# Patient Record
Sex: Female | Born: 1965
Health system: Southern US, Community
[De-identification: ages and names within clinical notes are randomized; demographics above are authoritative.]

## PROBLEM LIST (undated history)

## (undated) DIAGNOSIS — I6529 Occlusion and stenosis of unspecified carotid artery: Secondary | ICD-10-CM

## (undated) DIAGNOSIS — I639 Cerebral infarction, unspecified: Secondary | ICD-10-CM

## (undated) DIAGNOSIS — D649 Anemia, unspecified: Secondary | ICD-10-CM

## (undated) DIAGNOSIS — G459 Transient cerebral ischemic attack, unspecified: Secondary | ICD-10-CM

## (undated) DIAGNOSIS — I10 Essential (primary) hypertension: Secondary | ICD-10-CM

## (undated) HISTORY — DX: Cerebral infarction, unspecified: I63.9

## (undated) HISTORY — DX: Essential (primary) hypertension: I10

## (undated) HISTORY — DX: Occlusion and stenosis of unspecified carotid artery: I65.29

---

## 2001-07-24 ENCOUNTER — Emergency Department (HOSPITAL_COMMUNITY): Admission: EM | Admit: 2001-07-24 | Discharge: 2001-07-24 | Payer: Self-pay | Admitting: Emergency Medicine

## 2003-11-06 ENCOUNTER — Encounter: Admission: RE | Admit: 2003-11-06 | Discharge: 2003-11-06 | Payer: Self-pay | Admitting: Obstetrics and Gynecology

## 2006-05-25 ENCOUNTER — Ambulatory Visit: Payer: Self-pay | Admitting: Nurse Practitioner

## 2007-10-20 ENCOUNTER — Emergency Department (HOSPITAL_COMMUNITY): Admission: EM | Admit: 2007-10-20 | Discharge: 2007-10-20 | Payer: Self-pay | Admitting: Emergency Medicine

## 2010-06-11 ENCOUNTER — Other Ambulatory Visit: Payer: Self-pay | Admitting: Obstetrics and Gynecology

## 2010-06-11 DIAGNOSIS — Z1231 Encounter for screening mammogram for malignant neoplasm of breast: Secondary | ICD-10-CM

## 2010-06-29 ENCOUNTER — Ambulatory Visit: Payer: Self-pay

## 2010-06-29 ENCOUNTER — Other Ambulatory Visit: Payer: Self-pay | Admitting: Internal Medicine

## 2010-07-16 ENCOUNTER — Ambulatory Visit
Admission: RE | Admit: 2010-07-16 | Discharge: 2010-07-16 | Disposition: A | Discharge status: Home / Self Care | Payer: Medicaid Other | Source: Ambulatory Visit | Attending: Obstetrics and Gynecology | Admitting: Obstetrics and Gynecology

## 2010-07-16 DIAGNOSIS — Z1231 Encounter for screening mammogram for malignant neoplasm of breast: Secondary | ICD-10-CM

## 2010-08-28 ENCOUNTER — Emergency Department (HOSPITAL_COMMUNITY): Payer: Medicaid Other

## 2010-08-28 ENCOUNTER — Emergency Department (HOSPITAL_COMMUNITY)
Admission: EM | Admit: 2010-08-28 | Discharge: 2010-08-28 | Disposition: A | Discharge status: Home / Self Care | Payer: Medicaid Other | Attending: Emergency Medicine | Admitting: Emergency Medicine

## 2010-08-28 DIAGNOSIS — R05 Cough: Secondary | ICD-10-CM | POA: Insufficient documentation

## 2010-08-28 DIAGNOSIS — J3489 Other specified disorders of nose and nasal sinuses: Secondary | ICD-10-CM | POA: Insufficient documentation

## 2010-08-28 DIAGNOSIS — R5383 Other fatigue: Secondary | ICD-10-CM | POA: Insufficient documentation

## 2010-08-28 DIAGNOSIS — R5381 Other malaise: Secondary | ICD-10-CM | POA: Insufficient documentation

## 2010-08-28 DIAGNOSIS — R059 Cough, unspecified: Secondary | ICD-10-CM | POA: Insufficient documentation

## 2010-08-28 DIAGNOSIS — R509 Fever, unspecified: Secondary | ICD-10-CM | POA: Insufficient documentation

## 2010-08-28 DIAGNOSIS — J189 Pneumonia, unspecified organism: Secondary | ICD-10-CM | POA: Insufficient documentation

## 2011-01-27 LAB — URINE MICROSCOPIC-ADD ON

## 2011-01-27 LAB — URINALYSIS, ROUTINE W REFLEX MICROSCOPIC
Ketones, ur: NEGATIVE
Protein, ur: NEGATIVE
Specific Gravity, Urine: 1.02
Urobilinogen, UA: 1
pH: 7.5

## 2011-01-27 LAB — HEPATIC FUNCTION PANEL
AST: 16
Albumin: 3.8
Alkaline Phosphatase: 60
Indirect Bilirubin: 0.6
Total Protein: 7

## 2011-01-27 LAB — WET PREP, GENITAL: Trich, Wet Prep: NONE SEEN

## 2011-01-27 LAB — POCT I-STAT, CHEM 8
Calcium, Ion: 1.15
Chloride: 103
Creatinine, Ser: 0.8
Potassium: 3.4 — ABNORMAL LOW
Sodium: 140

## 2011-01-27 LAB — DIFFERENTIAL
Basophils Absolute: 0
Basophils Relative: 0
Lymphocytes Relative: 34
Monocytes Absolute: 0.4

## 2011-01-27 LAB — CBC
MCV: 85
WBC: 6.1

## 2011-01-27 LAB — URINE CULTURE: Culture: NO GROWTH

## 2011-01-27 LAB — POCT PREGNANCY, URINE
Operator id: 257131
Preg Test, Ur: NEGATIVE

## 2011-07-23 ENCOUNTER — Ambulatory Visit (INDEPENDENT_AMBULATORY_CARE_PROVIDER_SITE_OTHER): Payer: BC Managed Care – PPO | Admitting: Family Medicine

## 2011-07-23 VITALS — BP 120/85 | HR 90 | Temp 98.7°F | Resp 16 | Ht 66.0 in | Wt 228.0 lb

## 2011-07-23 DIAGNOSIS — R6889 Other general symptoms and signs: Secondary | ICD-10-CM

## 2011-07-23 DIAGNOSIS — R05 Cough: Secondary | ICD-10-CM

## 2011-07-23 DIAGNOSIS — J329 Chronic sinusitis, unspecified: Secondary | ICD-10-CM

## 2011-07-23 DIAGNOSIS — J111 Influenza due to unidentified influenza virus with other respiratory manifestations: Secondary | ICD-10-CM

## 2011-07-23 DIAGNOSIS — R059 Cough, unspecified: Secondary | ICD-10-CM

## 2011-07-23 DIAGNOSIS — J019 Acute sinusitis, unspecified: Secondary | ICD-10-CM

## 2011-07-23 LAB — POCT INFLUENZA A/B
Influenza A, POC: NEGATIVE
Influenza B, POC: NEGATIVE

## 2011-07-23 MED ORDER — HYDROCODONE-HOMATROPINE 5-1.5 MG/5ML PO SYRP: 5.0000 mL | ORAL_SOLUTION | Freq: Three times a day (TID) | ORAL | Status: AC | PRN

## 2011-07-23 MED ORDER — FLUTICASONE PROPIONATE 50 MCG/ACT NA SUSP: 2.0000 | Freq: Every day | NASAL | Status: DC

## 2011-07-23 MED ORDER — AZITHROMYCIN 250 MG PO TABS: ORAL_TABLET | ORAL | Status: AC

## 2011-07-23 MED ORDER — BENZONATATE 100 MG PO CAPS: 200.0000 mg | ORAL_CAPSULE | Freq: Two times a day (BID) | ORAL | Status: AC | PRN

## 2011-07-23 NOTE — Progress Notes (Signed)
  [facility]:  Office Visit  Chief Complaint:  Chief Complaint  Patient presents with  . Cough  . Chills  . Nasal Congestion    HPI: Ruth Conley is a 46 y.o. female who complains of  2 day history of msk aches and pain, chills, and wet and dry cough, nasal congestion . Yellow phlegm. + sinus pressure   Past Medical History  Diagnosis Date  . Hypertension    Past Surgical History  Procedure Date  . Cesarean section    History   Social History  . Marital Status: Legally Separated    Spouse Name: N/A    Number of Children: N/A  . Years of Education: N/A   Social History Main Topics  . Smoking status: Never Smoker   . Smokeless tobacco: None  . Alcohol Use: None  . Drug Use: None  . Sexually Active: None   Other Topics Concern  . None   Social History Narrative  . None   No family history on file. No Known Allergies Prior to Admission medications   Not on File     ROS: The patient denies fevers,, night sweats, unintentional weight loss, chest pain, palpitations, wheezing, dyspnea on exertion, nausea, vomiting, abdominal pain, dysuria, hematuria, melena, numbness, weakness, or tingling.  All other systems have been reviewed and were otherwise negative with the exception of those mentioned in the HPI and as above.    PHYSICAL EXAM: Filed Vitals:   07/23/11 1626  BP: 142/93  Pulse: 103  Temp: 98.7 F (37.1 C)  Resp: 16   Filed Vitals:   07/23/11 1626  Height: 5\' 6"  (1.676 m)  Weight: 228 lb (103.42 kg)   Body mass index is 36.80 kg/(m^2).  General: Alert, no acute distress HEENT:  Normocephalic, atraumatic, oropharynx patent. Tmnl, + sinus pressure, erythematous nares Cardiovascular:  Regular rate and rhythm, no rubs murmurs or gallops.  No Carotid bruits, radial pulse intact. No pedal edema.  Respiratory: Clear to auscultation bilaterally.  No wheezes, rales, or rhonchi.  No cyanosis, no use of accessory musculature GI: No  organomegaly, abdomen is soft and non-tender, positive bowel sounds.  No masses. Skin: No rashes. Neurologic: Facial musculature symmetric. Psychiatric: Patient is appropriate throughout our interaction. Lymphatic: No cervical lymphadenopathy Musculoskeletal: Gait intact.   LABS: Results for orders placed in visit on 07/23/11  POCT INFLUENZA A/B      Component Value Range   Influenza A, POC Negative     Influenza B, POC Negative       EKG/XRAY:   Primary read interpreted by Dr. Conley Rolls at [facility].   ASSESSMENT/PLAN: Encounter Diagnoses  Name Primary?  . Flu-like symptoms Yes  . Sinusitis     Sxs treatment, may take Z pack in 1 week if no improvement   Ruth Zhao PHUONG, DO 07/23/2011 5:30 PM

## 2011-12-05 ENCOUNTER — Other Ambulatory Visit: Payer: Self-pay | Admitting: Obstetrics and Gynecology

## 2011-12-05 DIAGNOSIS — Z1231 Encounter for screening mammogram for malignant neoplasm of breast: Secondary | ICD-10-CM

## 2011-12-12 ENCOUNTER — Ambulatory Visit
Admission: RE | Admit: 2011-12-12 | Discharge: 2011-12-12 | Disposition: A | Discharge status: Home / Self Care | Payer: BC Managed Care – PPO | Source: Ambulatory Visit | Attending: Obstetrics and Gynecology | Admitting: Obstetrics and Gynecology

## 2011-12-12 DIAGNOSIS — Z1231 Encounter for screening mammogram for malignant neoplasm of breast: Secondary | ICD-10-CM

## 2012-04-07 ENCOUNTER — Encounter: Payer: Self-pay | Admitting: Family Medicine

## 2012-04-07 ENCOUNTER — Ambulatory Visit (INDEPENDENT_AMBULATORY_CARE_PROVIDER_SITE_OTHER): Payer: BC Managed Care – PPO | Admitting: Family Medicine

## 2012-04-07 VITALS — BP 151/94 | HR 88 | Temp 99.0°F | Resp 16 | Ht 67.0 in | Wt 254.0 lb

## 2012-04-07 DIAGNOSIS — J3489 Other specified disorders of nose and nasal sinuses: Secondary | ICD-10-CM

## 2012-04-07 DIAGNOSIS — R05 Cough: Secondary | ICD-10-CM

## 2012-04-07 DIAGNOSIS — R0981 Nasal congestion: Secondary | ICD-10-CM

## 2012-04-07 DIAGNOSIS — R059 Cough, unspecified: Secondary | ICD-10-CM

## 2012-04-07 MED ORDER — HYDROCODONE-HOMATROPINE 5-1.5 MG/5ML PO SYRP: 5.0000 mL | ORAL_SOLUTION | Freq: Three times a day (TID) | ORAL | Status: DC | PRN

## 2012-04-07 MED ORDER — AMOXICILLIN 875 MG PO TABS: 875.0000 mg | ORAL_TABLET | Freq: Two times a day (BID) | ORAL | Status: DC

## 2012-04-07 NOTE — Patient Instructions (Addendum)

## 2012-04-07 NOTE — Progress Notes (Signed)
@[email or web address] @   Patient ID: Ruth Conley MRN: [id number], DOB: Jan 05, 1966, 46 y.o. Date of Encounter: 04/07/2012, 1:03 PM  Primary Physician: No primary provider on file.  Chief Complaint:  Chief Complaint  Patient presents with  . Cough    productive  . Sore Throat    sinus pain and pressure symptoms x 3 days    HPI: 46 y.o. year old female presents with 3 day history of nasal congestion, post nasal drip, sore throat, sinus pressure, and cough. Afebrile. No chills. Nasal congestion thick and green/yellow. Sinus pressure is the worst symptom. Cough is productive secondary to post nasal drip and not associated with time of day. Ears feel full, leading to sensation of muffled hearing. Has tried OTC cold preps without success. No GI complaints.   No recent antibiotics, recent travels, or sick contacts   No leg trauma, sedentary periods, h/o cancer, or tobacco use.  Past Medical History  Diagnosis Date  . Hypertension      Home Meds: Prior to Admission medications   Medication Sig Start Date End Date Taking? Authorizing Provider  guaiFENesin-dextromethorphan (ROBITUSSIN DM) 100-10 MG/5ML syrup Take 5 mLs by mouth 3 (three) times daily as needed.   Yes Historical Provider, MD  naproxen sodium (ANAPROX) 220 MG tablet Take 220 mg by mouth 2 (two) times daily with a meal.   Yes Historical Provider, MD  amoxicillin (AMOXIL) 875 MG tablet Take 1 tablet (875 mg total) by mouth 2 (two) times daily. 04/07/12   Elvina Sidle, MD  fluticasone (FLONASE) 50 MCG/ACT nasal spray Place 2 sprays into the nose daily. 07/23/11 07/22/12  Thao P Le, DO  HYDROcodone-homatropine (HYCODAN) 5-1.5 MG/5ML syrup Take 5 mLs by mouth every 8 (eight) hours as needed for cough. 04/07/12   Elvina Sidle, MD    Allergies: No Known Allergies  History   Social History  . Marital Status: Legally Separated    Spouse Name: N/A    Number of Children: N/A  . Years of Education: N/A   Occupational History  . Not  on file.   Social History Main Topics  . Smoking status: Never Smoker   . Smokeless tobacco: Not on file  . Alcohol Use: Not on file  . Drug Use: Not on file  . Sexually Active: Not on file   Other Topics Concern  . Not on file   Social History Narrative  . No narrative on file     Review of Systems: Constitutional: negative for chills, fever, night sweats or weight changes Cardiovascular: negative for chest pain or palpitations Respiratory: negative for hemoptysis, wheezing, or shortness of breath Abdominal: negative for abdominal pain, nausea, vomiting or diarrhea Dermatological: negative for rash Neurologic: negative for headache   Physical Exam: Blood pressure 151/94, pulse 88, temperature 99 F (37.2 C), temperature source Oral, resp. rate 16, height 5\' 7"  (1.702 m), weight 254 lb (115.214 kg), SpO2 100.00%., Body mass index is 39.78 kg/(m^2). General: Well developed, well nourished, in no acute distress. Head: Normocephalic, atraumatic, eyes without discharge, sclera non-icteric, nares are congested. Bilateral auditory canals clear, TM's are without perforation, pearly grey with reflective cone of light bilaterally. Serous effusion bilaterally behind TM's. Maxillary sinus TTP. Oral cavity moist, dentition multiple dental caries. Posterior pharynx with post nasal drip and mild erythema. No peritonsillar abscess or tonsillar exudate. Neck: Supple. No thyromegaly. Full ROM. No lymphadenopathy. Lungs: Clear bilaterally to auscultation without wheezes, rales, or rhonchi. Breathing is unlabored.  Heart: RRR with S1 S2. No murmurs, rubs,  or gallops appreciated. Msk:  Strength and tone normal for age. Extremities: No clubbing or cyanosis. No edema. Neuro: Alert and oriented X 3. Moves all extremities spontaneously. CNII-XII grossly in tact. Psych:  Responds to questions appropriately with a normal affect.   Labs:   ASSESSMENT AND PLAN:  46 y.o. year old female with  sinusitis 1. Sinus congestion  amoxicillin (AMOXIL) 875 MG tablet  2. Cough  HYDROcodone-homatropine (HYCODAN) 5-1.5 MG/5ML syrup    -  -Tylenol/Motrin prn -Rest/fluids -RTC precautions -RTC 3-5 days if no improvement  Signed, Elvina Sidle, MD 04/07/2012 1:03 PM

## 2012-04-09 ENCOUNTER — Telehealth: Payer: Self-pay

## 2012-04-09 NOTE — Telephone Encounter (Signed)
PT STATES SHE WAS OFFERED A NOTE FOR WORK, BUT DIDN'T THINK SHE NEEDED IT, BUT NOW HER DOES. WASN'T ABLE TO GO TO WORK TODAY SO JUST NEED IT FOR TODAY. PLEASE CALL [phone] WHEN READY

## 2012-04-10 ENCOUNTER — Telehealth: Payer: Self-pay

## 2012-04-10 NOTE — Telephone Encounter (Signed)
PATIENT STATES SHE WAS IN TO SEE DR. Milus Glazier DEC. 7TH, FOR A COUGH AND SORE THROAT. SHE NEEDS TO GET AN OUT OF WORK NOTE FOR Monday AND Tuesday AND RETURNING BACK TO WORK ON Wednesday. PLEASE CALL HER WHEN IT IS READY TO BE PICKED UP. BEST PHONE [phone] (CELL)   MBC

## 2012-04-10 NOTE — Telephone Encounter (Signed)
Called her, I can give her the note, but if she is not better by Wednesday and can not work, she must return to clinic.

## 2012-08-20 ENCOUNTER — Other Ambulatory Visit: Payer: Self-pay

## 2012-08-20 DIAGNOSIS — Z1231 Encounter for screening mammogram for malignant neoplasm of breast: Secondary | ICD-10-CM

## 2012-10-10 ENCOUNTER — Emergency Department (HOSPITAL_BASED_OUTPATIENT_CLINIC_OR_DEPARTMENT_OTHER)
Admission: EM | Admit: 2012-10-10 | Discharge: 2012-10-10 | Disposition: A | Discharge status: Home / Self Care | Payer: Medicaid Other | Attending: Emergency Medicine | Admitting: Emergency Medicine

## 2012-10-10 ENCOUNTER — Encounter (HOSPITAL_BASED_OUTPATIENT_CLINIC_OR_DEPARTMENT_OTHER): Payer: Self-pay | Admitting: *Deleted

## 2012-10-10 DIAGNOSIS — R0981 Nasal congestion: Secondary | ICD-10-CM

## 2012-10-10 DIAGNOSIS — Z791 Long term (current) use of non-steroidal anti-inflammatories (NSAID): Secondary | ICD-10-CM | POA: Insufficient documentation

## 2012-10-10 DIAGNOSIS — J3489 Other specified disorders of nose and nasal sinuses: Secondary | ICD-10-CM | POA: Insufficient documentation

## 2012-10-10 DIAGNOSIS — J029 Acute pharyngitis, unspecified: Secondary | ICD-10-CM

## 2012-10-10 DIAGNOSIS — R0982 Postnasal drip: Secondary | ICD-10-CM | POA: Insufficient documentation

## 2012-10-10 DIAGNOSIS — R1310 Dysphagia, unspecified: Secondary | ICD-10-CM | POA: Insufficient documentation

## 2012-10-10 DIAGNOSIS — I10 Essential (primary) hypertension: Secondary | ICD-10-CM | POA: Insufficient documentation

## 2012-10-10 MED ORDER — CETIRIZINE-PSEUDOEPHEDRINE ER 5-120 MG PO TB12: 1.0000 | ORAL_TABLET | Freq: Every day | ORAL | Status: DC

## 2012-10-10 NOTE — ED Provider Notes (Signed)
History     CSN: [id number]  Arrival date & time 10/10/12  0913   First MD Initiated Contact with Patient 10/10/12 0932      Chief Complaint  Patient presents with  . sinus pain and drainage     (Consider location/radiation/quality/duration/timing/severity/associated sxs/prior treatment) HPI Pt presents with c/o nasal congestion and post nasal drip as well as sore throat.  Symptoms started 3 days ago, sore throat worse yesterday.  Pain with swallowing, no dificutly breathing or swallowing.  No fever or cough.  Has been taking ibuprofen for sore throat.  No specific sick contacts.  There are no other associated systemic symptoms, there are no other alleviating or modifying factors.   Past Medical History  Diagnosis Date  . Hypertension     Past Surgical History  Procedure Laterality Date  . Cesarean section      History reviewed. No pertinent family history.  History  Substance Use Topics  . Smoking status: Never Smoker   . Smokeless tobacco: Not on file  . Alcohol Use: No    OB History   Grav Para Term Preterm Abortions TAB SAB Ect Mult Living                  Review of Systems ROS reviewed and all otherwise negative except for mentioned in HPI  Allergies  Review of patient's allergies indicates no known allergies.  Home Medications   Current Outpatient Rx  Name  Route  Sig  Dispense  Refill  . amoxicillin (AMOXIL) 875 MG tablet   Oral   Take 1 tablet (875 mg total) by mouth 2 (two) times daily.   20 tablet   0   . cetirizine-pseudoephedrine (ZYRTEC-D) 5-120 MG per tablet   Oral   Take 1 tablet by mouth daily.   30 tablet   0   . guaiFENesin-dextromethorphan (ROBITUSSIN DM) 100-10 MG/5ML syrup   Oral   Take 5 mLs by mouth 3 (three) times daily as needed.         Marland Kitchen HYDROcodone-homatropine (HYCODAN) 5-1.5 MG/5ML syrup   Oral   Take 5 mLs by mouth every 8 (eight) hours as needed for cough.   120 mL   0   . naproxen sodium (ANAPROX) 220 MG  tablet   Oral   Take 220 mg by mouth 2 (two) times daily with a meal.           BP 157/92  Pulse 88  Temp(Src) 98.5 F (36.9 C) (Oral)  SpO2 100% Vitals reviewed Physical Exam Physical Examination: General appearance - alert, well appearing, and in no distress Mental status - alert, oriented to person, place, and time Eyes - no conjunctival injection, no scleral icterus Mouth - mucous membranes moist, pharynx normal without lesions , mild erythema of OP, palate symmetric, uvula midline Neck - supple, no significant adenopathy Chest - clear to auscultation, no wheezes, rales or rhonchi, symmetric air entry Heart - normal rate, regular rhythm, normal S1, S2, no murmurs, rubs, clicks or gallops Abdomen - soft, nontender, nondistended, no masses or organomegaly Extremities - peripheral pulses normal, no pedal edema, no clubbing or cyanosis Skin - normal coloration and turgor, no rashes  ED Course  Procedures (including critical care time)  Labs Reviewed  RAPID STREP SCREEN  CULTURE, GROUP A STREP   No results found.   1. Pharyngitis   2. Nasal congestion       MDM  Pt presenting with c/o sore throat and nassl cngetstion.  Rapid strep negative.  Will start on zyrtec D for her symptoms.  Continue ibuprofen and increase po fluids.  Discharged with strict return precautions.  Pt agreeable with plan.        Ethelda Chick, MD 10/10/12 1050

## 2012-10-10 NOTE — ED Notes (Signed)
Pt states on Sat she started having sinus congestion with pain across forehead and across face Monday sinus drainage began and is now having so much sinus drainage that her throat is sore and she has a cough as well

## 2012-10-12 LAB — CULTURE, GROUP A STREP

## 2012-12-12 ENCOUNTER — Ambulatory Visit: Payer: BC Managed Care – PPO

## 2012-12-26 ENCOUNTER — Ambulatory Visit
Admission: RE | Admit: 2012-12-26 | Discharge: 2012-12-26 | Disposition: A | Discharge status: Home / Self Care | Payer: Medicaid Other | Source: Ambulatory Visit

## 2012-12-26 DIAGNOSIS — Z1231 Encounter for screening mammogram for malignant neoplasm of breast: Secondary | ICD-10-CM

## 2013-03-22 IMAGING — CR DG CHEST 2V
2 series · 2 of 2 positions shown · non-contrast
Comparison: None

CLINICAL DATA: Fever and cough

CHEST - 2 VIEW

[w chest pa]
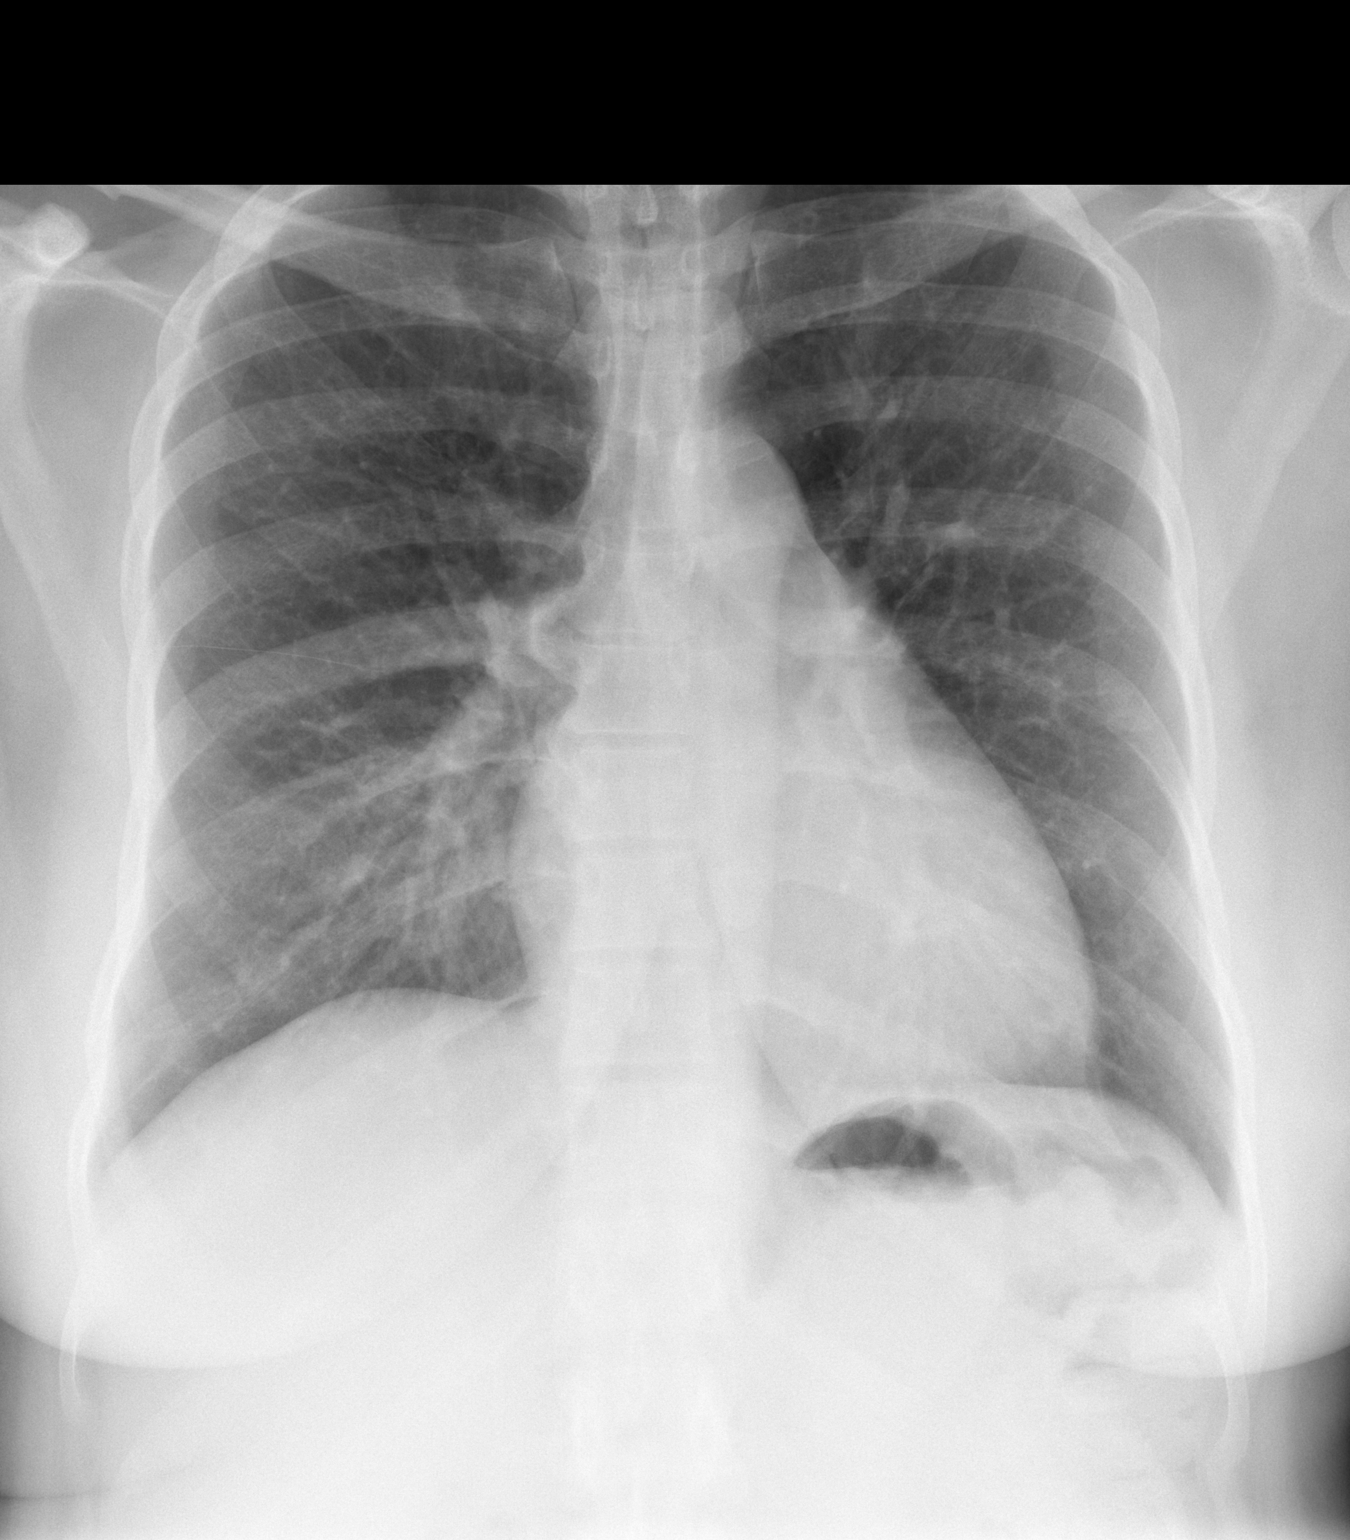

[w chest lat]
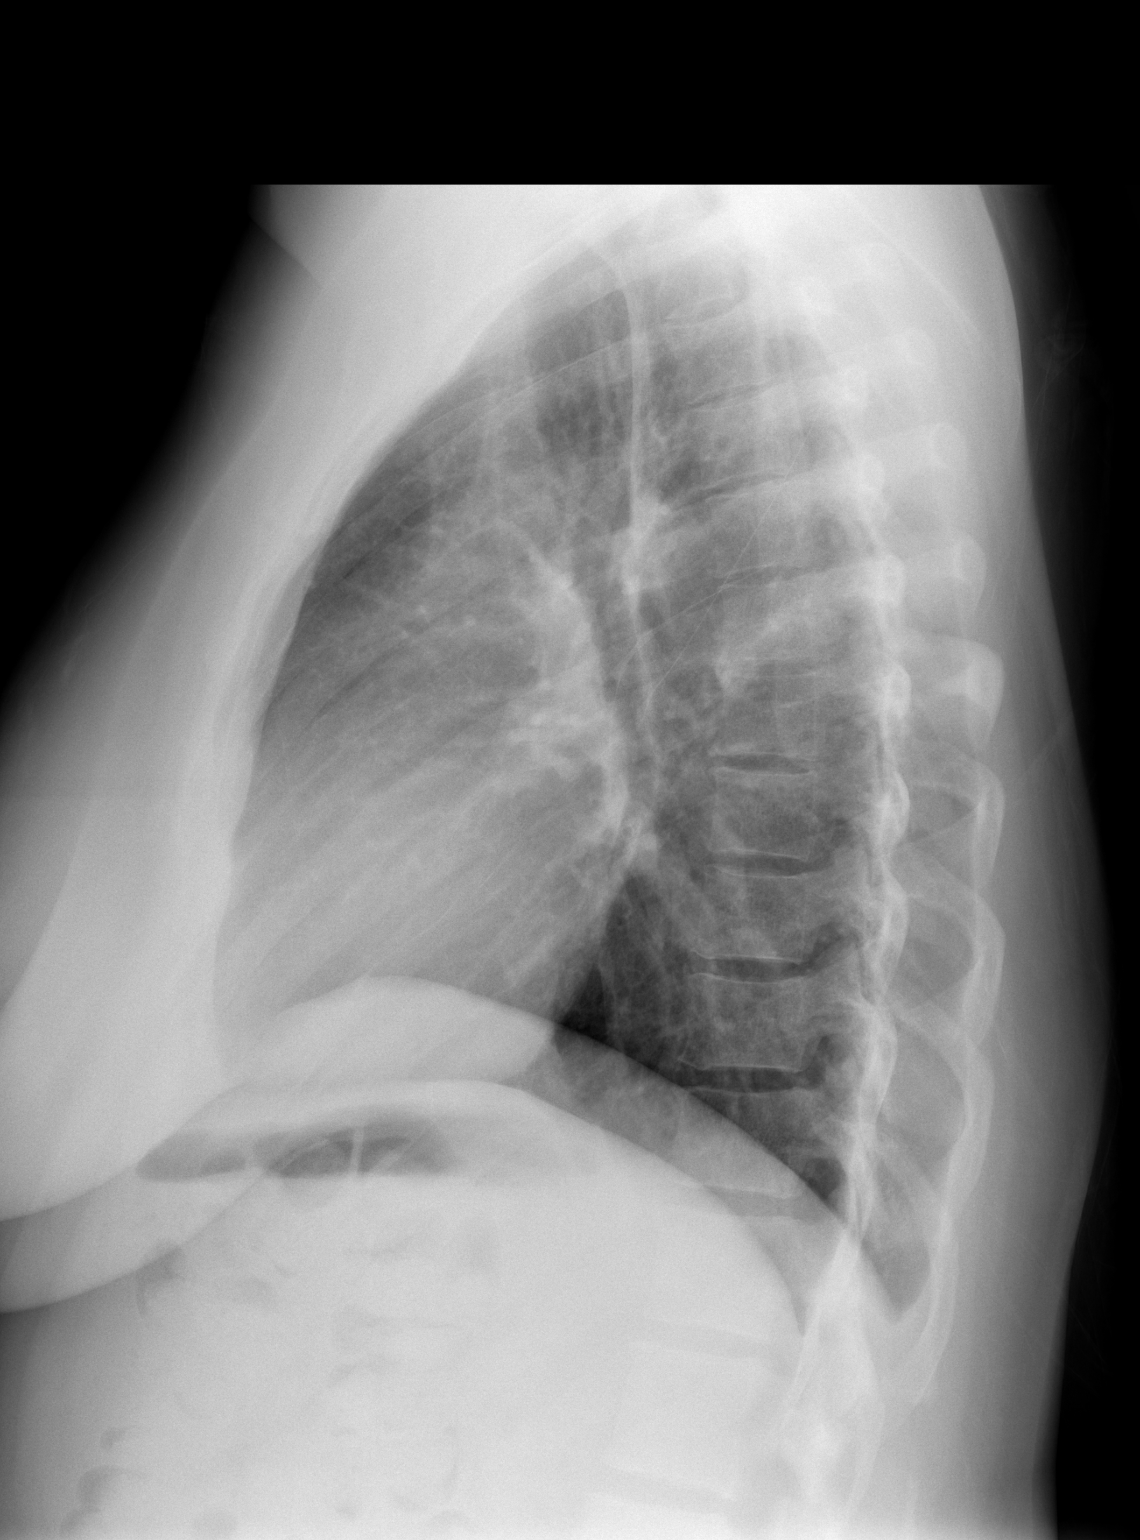

[2 of 2 positions shown; findings below may reference images not displayed]

FINDINGS: Normal cardiac silhouette.  There is a branching  linear
opacity in the right lower lobe.  This is not evident on the
lateral projection.  No pleural fluid.  No pneumothorax.  No bony
abnormality.
IMPRESSION: Concern for early right lower lobe pneumonia.

## 2013-04-09 ENCOUNTER — Emergency Department (HOSPITAL_BASED_OUTPATIENT_CLINIC_OR_DEPARTMENT_OTHER)
Admission: EM | Admit: 2013-04-09 | Discharge: 2013-04-09 | Disposition: A | Discharge status: Home / Self Care | Payer: Medicaid Other | Attending: Emergency Medicine | Admitting: Emergency Medicine

## 2013-04-09 ENCOUNTER — Emergency Department (HOSPITAL_BASED_OUTPATIENT_CLINIC_OR_DEPARTMENT_OTHER): Payer: Medicaid Other

## 2013-04-09 ENCOUNTER — Encounter (HOSPITAL_BASED_OUTPATIENT_CLINIC_OR_DEPARTMENT_OTHER): Payer: Self-pay | Admitting: Emergency Medicine

## 2013-04-09 DIAGNOSIS — R059 Cough, unspecified: Secondary | ICD-10-CM

## 2013-04-09 DIAGNOSIS — Z792 Long term (current) use of antibiotics: Secondary | ICD-10-CM | POA: Insufficient documentation

## 2013-04-09 DIAGNOSIS — Z791 Long term (current) use of non-steroidal anti-inflammatories (NSAID): Secondary | ICD-10-CM | POA: Insufficient documentation

## 2013-04-09 DIAGNOSIS — I10 Essential (primary) hypertension: Secondary | ICD-10-CM | POA: Insufficient documentation

## 2013-04-09 DIAGNOSIS — J069 Acute upper respiratory infection, unspecified: Secondary | ICD-10-CM

## 2013-04-09 DIAGNOSIS — R05 Cough: Secondary | ICD-10-CM

## 2013-04-09 DIAGNOSIS — Z79899 Other long term (current) drug therapy: Secondary | ICD-10-CM | POA: Insufficient documentation

## 2013-04-09 MED ORDER — HYDROCODONE-HOMATROPINE 5-1.5 MG/5ML PO SYRP: 5.0000 mL | ORAL_SOLUTION | Freq: Three times a day (TID) | ORAL | Status: DC | PRN

## 2013-04-09 NOTE — ED Notes (Signed)
Cough, chills, sore throat and headache x 2 days.

## 2013-04-09 NOTE — ED Provider Notes (Signed)
Medical screening examination/treatment/procedure(s) were performed by non-physician practitioner and as supervising physician I was immediately available for consultation/collaboration.  EKG Interpretation   None         William Granite Godman, MD 04/09/13 1449 

## 2013-04-09 NOTE — ED Provider Notes (Signed)
CSN: [id number]     Arrival date & time 04/09/13  1327 History   First MD Initiated Contact with Patient 04/09/13 1329     Chief Complaint  Patient presents with  . URI   (Consider location/radiation/quality/duration/timing/severity/associated sxs/prior Treatment) Patient is a 47 y.o. female presenting with URI. The history is provided by the patient. No language interpreter was used.  URI Presenting symptoms: congestion, cough and sore throat   Severity:  Moderate Onset quality:  Sudden Duration:  2 days Timing:  Constant Progression:  Unchanged Chronicity:  New Worsened by:  Nothing tried   Past Medical History  Diagnosis Date  . Hypertension    Past Surgical History  Procedure Laterality Date  . Cesarean section     No family history on file. History  Substance Use Topics  . Smoking status: Never Smoker   . Smokeless tobacco: Not on file  . Alcohol Use: No   OB History   Grav Para Term Preterm Abortions TAB SAB Ect Mult Living                 Review of Systems  HENT: Positive for congestion and sore throat.   Respiratory: Positive for cough.   Cardiovascular: Negative.     Allergies  Review of patient's allergies indicates no known allergies.  Home Medications   Current Outpatient Rx  Name  Route  Sig  Dispense  Refill  . amoxicillin (AMOXIL) 875 MG tablet   Oral   Take 1 tablet (875 mg total) by mouth 2 (two) times daily.   20 tablet   0   . cetirizine-pseudoephedrine (ZYRTEC-D) 5-120 MG per tablet   Oral   Take 1 tablet by mouth daily.   30 tablet   0   . guaiFENesin-dextromethorphan (ROBITUSSIN DM) 100-10 MG/5ML syrup   Oral   Take 5 mLs by mouth 3 (three) times daily as needed.         Marland Kitchen HYDROcodone-homatropine (HYCODAN) 5-1.5 MG/5ML syrup   Oral   Take 5 mLs by mouth every 8 (eight) hours as needed for cough.   120 mL   0   . naproxen sodium (ANAPROX) 220 MG tablet   Oral   Take 220 mg by mouth 2 (two) times daily with a meal.           BP 164/98  Pulse 111  Temp(Src) 98.9 F (37.2 C) (Oral)  Resp 20  Ht 5\' 7"  (1.702 m)  Wt 225 lb (102.059 kg)  BMI 35.23 kg/m2  SpO2 99% Physical Exam  Vitals reviewed. Constitutional: She appears well-developed and well-nourished.  HENT:  Right Ear: External ear normal.  Left Ear: External ear normal.  Nose: Rhinorrhea present.  Mouth/Throat: Posterior oropharyngeal erythema present.  Eyes: Conjunctivae and EOM are normal. Pupils are equal, round, and reactive to light.  Cardiovascular: Normal rate.   Pulmonary/Chest: Effort normal and breath sounds normal.  Musculoskeletal: Normal range of motion.    ED Course  Procedures (including critical care time) Labs Review Labs Reviewed - No data to display Imaging Review Dg Chest 2 View  04/09/2013   CLINICAL DATA:  Sore throat  EXAM: CHEST  2 VIEW  COMPARISON:  08/28/2010  FINDINGS: Cardiomediastinal silhouette is stable. No acute infiltrate or pleural effusion. No pulmonary edema. Bony thorax is unremarkable.  IMPRESSION: No active cardiopulmonary disease.   Electronically Signed   By: Natasha Mead M.D.   On: 04/09/2013 14:17    EKG Interpretation   None  MDM   1. URI (upper respiratory infection)   2. Cough    Likely viral in nature:will treat the cough:no antibiotics needed at this time    Teressa Lower, NP 04/09/13 1431

## 2013-04-12 ENCOUNTER — Emergency Department (HOSPITAL_COMMUNITY)
Admission: EM | Admit: 2013-04-12 | Discharge: 2013-04-12 | Disposition: A | Discharge status: Home / Self Care | Payer: Medicaid Other | Attending: Emergency Medicine | Admitting: Emergency Medicine

## 2013-04-12 ENCOUNTER — Encounter (HOSPITAL_COMMUNITY): Payer: Self-pay | Admitting: Emergency Medicine

## 2013-04-12 DIAGNOSIS — I10 Essential (primary) hypertension: Secondary | ICD-10-CM | POA: Insufficient documentation

## 2013-04-12 DIAGNOSIS — J040 Acute laryngitis: Secondary | ICD-10-CM

## 2013-04-12 DIAGNOSIS — J069 Acute upper respiratory infection, unspecified: Secondary | ICD-10-CM | POA: Insufficient documentation

## 2013-04-12 MED ORDER — DEXAMETHASONE SODIUM PHOSPHATE 4 MG/ML IJ SOLN
10.0000 mg | Freq: Once | INTRAMUSCULAR | Status: AC
  Administered 2013-04-12: 10 mg via INTRAVENOUS
  Filled 2013-04-12: qty 3

## 2013-04-12 MED ORDER — BENZONATATE 100 MG PO CAPS: 100.0000 mg | ORAL_CAPSULE | Freq: Three times a day (TID) | ORAL | Status: DC | PRN

## 2013-04-12 NOTE — ED Notes (Signed)
Patient states she started having "a cold" on Sunday, but started losing her voice on Monday.  Patient complains of cough, productive with yellow sputum.  Patient claims nasal congestion and sore throat.

## 2013-04-12 NOTE — ED Provider Notes (Signed)
CSN: [id number]     Arrival date & time 04/12/13  0932 History   First MD Initiated Contact with Patient 04/12/13 1038   This chart was scribed for Mellody Drown PA-C, a non-physician practitioner working with Laray Anger, DO by Lewanda Rife, ED Scribe. This patient was seen in room TR07C/TR07C and the patient's care was started at 10:40 AM     Chief Complaint  Patient presents with  . Cough  . Sore Throat  . Nasal Congestion   (Consider location/radiation/quality/duration/timing/severity/associated sxs/prior Treatment) The history is provided by the patient. No language interpreter was used.   HPI Comments: Ruth Conley is a 47 y.o. female who presents to the Emergency Department complaining of worsening intermittent productive cough with yellow sputum onset 5 days, but reports occasional red streaks in sputum today. Reports associated waxing and waning chills, difficulty breathing, sore throat, and nasal congestion. She reports she was evaluated in ED 04/09/13 for the same but returns due to the hoarseness. Reports trying Hycodan, zicam and tea with no relief of symptoms. Denies any alleviating factors. Reports symptoms are aggravated at night. Denies associated fever, leg swelling, and pleuritic chest pain.  The patient is requesting steroids because she can't work and "I work with my voice".   Past Medical History  Diagnosis Date  . Hypertension    Past Surgical History  Procedure Laterality Date  . Cesarean section     No family history on file. History  Substance Use Topics  . Smoking status: Never Smoker   . Smokeless tobacco: Not on file  . Alcohol Use: No   OB History   Grav Para Term Preterm Abortions TAB SAB Ect Mult Living                 Review of Systems  Constitutional: Negative for fever and chills.  HENT: Positive for congestion, sore throat and voice change. Negative for drooling.   Respiratory: Positive for cough.    A complete 10 system  review of systems was obtained and all systems are negative except as noted in the HPI and PMHx.    Allergies  Review of patient's allergies indicates no known allergies.  Home Medications   Current Outpatient Rx  Name  Route  Sig  Dispense  Refill  . Homeopathic Products (ZICAM COLD REMEDY PO)   Oral   Take 1 tablet by mouth every 3 (three) hours. Taking while she has a cold.         Marland Kitchen HYDROcodone-homatropine (HYCODAN) 5-1.5 MG/5ML syrup   Oral   Take 5 mLs by mouth every 8 (eight) hours as needed for cough.   120 mL   0   . ibuprofen (ADVIL,MOTRIN) 200 MG tablet   Oral   Take 200 mg by mouth every 6 (six) hours as needed for mild pain.          BP 144/94  Pulse 96  Temp(Src) 98.6 F (37 C) (Oral)  Resp 18  Ht 5\' 7"  (1.702 m)  Wt 230 lb (104.327 kg)  BMI 36.01 kg/m2  SpO2 97%  LMP 03/25/2013 Physical Exam  Nursing note and vitals reviewed. Constitutional: She is oriented to person, place, and time. She appears well-developed and well-nourished. No distress.  HENT:  Head: Normocephalic and atraumatic.  Right Ear: Tympanic membrane, external ear and ear canal normal.  Left Ear: Tympanic membrane, external ear and ear canal normal.  Nose: Mucosal edema and rhinorrhea present.  Mouth/Throat: Uvula is midline and mucous membranes  are normal. Mucous membranes are not dry. No trismus in the jaw. No uvula swelling. Posterior oropharyngeal erythema present. No oropharyngeal exudate.  Eyes: Conjunctivae are normal.  Neck: Neck supple.  Cardiovascular: Normal rate and regular rhythm.   No murmur heard. Pulmonary/Chest: Effort normal and breath sounds normal. No respiratory distress. She has no wheezes. She has no rales. She exhibits no tenderness.  Hoarse, patient is able to speak in complete sentences.  Musculoskeletal: Normal range of motion. She exhibits no edema.  Lymphadenopathy:       Head (right side): No submental, no submandibular and no tonsillar adenopathy  present.       Head (left side): No submental, no submandibular and no tonsillar adenopathy present.    She has no cervical adenopathy.  Neurological: She is alert and oriented to person, place, and time.  Skin: Skin is warm and dry.  Psychiatric: She has a normal mood and affect. Her behavior is normal.    ED Course  Procedures (including critical care time)  COORDINATION OF CARE:  Nursing notes reviewed. Vital signs reviewed. Initial pt interview and examination performed.   Treatment plan initiated: Medications  dexamethasone (DECADRON) injection 10 mg (10 mg Intravenous Given 04/12/13 1201)     Initial diagnostic testing ordered.    Labs Review Labs Reviewed - No data to display Imaging Review No results found.  EKG Interpretation   None       MDM   1. Viral upper respiratory infection   2. Laryngitis    Pt with a 6 day history of productive cough presents with a 5 day history of hoarseness.EMR shows XR performed 04/09/2013 without acute findings.  She was diagnosed with a viral URI.  URI most likely viral again. Hoarse on exam and she is requesting steroids to return to work.  Will treat for URI with symptomatic relief and steroids for laryngitis. Initial BP was elevated, delta BP 144/94. Discussed lab imaging results from 04/09/2013, and treatment plan with the patient. Return precautions given. She reports understanding and no other concerns at this time.  Patient is stable for discharge at this time.   Meds given in ED:  Medications  dexamethasone (DECADRON) injection 10 mg (10 mg Intravenous Given 04/12/13 1201)    Discharge Medication List as of 04/12/2013 11:56 AM    START taking these medications   Details  benzonatate (TESSALON PERLES) 100 MG capsule Take 1 capsule (100 mg total) by mouth 3 (three) times daily as needed for cough., Starting 04/12/2013, Until Discontinued, Print          I personally performed the services described in this  documentation, which was scribed in my presence. The recorded information has been reviewed and is accurate.     Clabe Seal, PA-C 04/13/13 1904

## 2013-04-15 NOTE — ED Provider Notes (Signed)
Medical screening examination/treatment/procedure(s) were performed by non-physician practitioner and as supervising physician I was immediately available for consultation/collaboration.  EKG Interpretation   None         Khoen Genet M Ardith Test, DO 04/15/13 0814 

## 2014-04-27 ENCOUNTER — Encounter (HOSPITAL_COMMUNITY): Payer: Self-pay | Admitting: *Deleted

## 2014-04-27 ENCOUNTER — Emergency Department (HOSPITAL_COMMUNITY)
Admission: EM | Admit: 2014-04-27 | Discharge: 2014-04-27 | Discharge status: Against Medical Advice | Payer: Medicaid Other | Attending: Emergency Medicine | Admitting: Emergency Medicine

## 2014-04-27 DIAGNOSIS — I10 Essential (primary) hypertension: Secondary | ICD-10-CM | POA: Insufficient documentation

## 2014-04-27 DIAGNOSIS — Z5329 Procedure and treatment not carried out because of patient's decision for other reasons: Secondary | ICD-10-CM | POA: Insufficient documentation

## 2014-04-27 NOTE — ED Notes (Deleted)
Pt reports lower back pain and vaginal bleeding x2 days, pain 8/10.

## 2014-04-27 NOTE — ED Notes (Signed)
Pt has been called for 3 times with no response.

## 2014-05-02 DIAGNOSIS — G459 Transient cerebral ischemic attack, unspecified: Secondary | ICD-10-CM

## 2014-05-02 HISTORY — DX: Transient cerebral ischemic attack, unspecified: G45.9

## 2014-08-29 ENCOUNTER — Encounter (HOSPITAL_BASED_OUTPATIENT_CLINIC_OR_DEPARTMENT_OTHER): Payer: Self-pay

## 2014-08-29 ENCOUNTER — Emergency Department (HOSPITAL_BASED_OUTPATIENT_CLINIC_OR_DEPARTMENT_OTHER): Payer: BLUE CROSS/BLUE SHIELD

## 2014-08-29 ENCOUNTER — Observation Stay (HOSPITAL_BASED_OUTPATIENT_CLINIC_OR_DEPARTMENT_OTHER)
Admission: EM | Admit: 2014-08-29 | Discharge: 2014-08-31 | Disposition: A | Discharge status: Home / Self Care | Payer: BLUE CROSS/BLUE SHIELD | Attending: Internal Medicine | Admitting: Internal Medicine

## 2014-08-29 DIAGNOSIS — I10 Essential (primary) hypertension: Secondary | ICD-10-CM | POA: Diagnosis not present

## 2014-08-29 DIAGNOSIS — E669 Obesity, unspecified: Secondary | ICD-10-CM | POA: Insufficient documentation

## 2014-08-29 DIAGNOSIS — Z6834 Body mass index (BMI) 34.0-34.9, adult: Secondary | ICD-10-CM | POA: Diagnosis not present

## 2014-08-29 DIAGNOSIS — Z86718 Personal history of other venous thrombosis and embolism: Secondary | ICD-10-CM | POA: Diagnosis not present

## 2014-08-29 DIAGNOSIS — R000 Tachycardia, unspecified: Secondary | ICD-10-CM | POA: Insufficient documentation

## 2014-08-29 DIAGNOSIS — Z79899 Other long term (current) drug therapy: Secondary | ICD-10-CM | POA: Diagnosis not present

## 2014-08-29 DIAGNOSIS — G459 Transient cerebral ischemic attack, unspecified: Secondary | ICD-10-CM | POA: Diagnosis present

## 2014-08-29 LAB — BASIC METABOLIC PANEL
ANION GAP: 5 (ref 5–15)
BUN: 9 mg/dL (ref 6–23)
CALCIUM: 8.5 mg/dL (ref 8.4–10.5)
CO2: 27 mmol/L (ref 19–32)
Chloride: 105 mmol/L (ref 96–112)
Creatinine, Ser: 0.74 mg/dL (ref 0.50–1.10)
GFR calc non Af Amer: >90 mL/min (ref >90)
Glucose, Bld: 115 mg/dL — ABNORMAL HIGH (ref 70–99)
POTASSIUM: 3.3 mmol/L — AB (ref 3.5–5.1)
Sodium: 137 mmol/L (ref 135–145)

## 2014-08-29 LAB — URINALYSIS, ROUTINE W REFLEX MICROSCOPIC
Bilirubin Urine: NEGATIVE
Glucose, UA: NEGATIVE mg/dL
Hgb urine dipstick: NEGATIVE
KETONES UR: NEGATIVE mg/dL
Leukocytes, UA: NEGATIVE
Nitrite: NEGATIVE
PH: 7 (ref 5.0–8.0)
PROTEIN: NEGATIVE mg/dL
Specific Gravity, Urine: 1.003 — ABNORMAL LOW (ref 1.005–1.030)
Urobilinogen, UA: 0.2 mg/dL (ref 0.0–1.0)

## 2014-08-29 LAB — CBC WITH DIFFERENTIAL/PLATELET
BASOS ABS: 0 10*3/uL (ref 0.0–0.1)
Basophils Relative: 0 % (ref 0–1)
EOS PCT: 2 % (ref 0–5)
Eosinophils Absolute: 0.1 10*3/uL (ref 0.0–0.7)
HCT: 32.5 % — ABNORMAL LOW (ref 36.0–46.0)
Hemoglobin: 10.5 g/dL — ABNORMAL LOW (ref 12.0–15.0)
LYMPHS PCT: 36 % (ref 12–46)
Lymphs Abs: 2.9 10*3/uL (ref 0.7–4.0)
MCH: 25.9 pg — ABNORMAL LOW (ref 26.0–34.0)
MCHC: 32.3 g/dL (ref 30.0–36.0)
MCV: 80.2 fL (ref 78.0–100.0)
MONO ABS: 0.7 10*3/uL (ref 0.1–1.0)
Monocytes Relative: 9 % (ref 3–12)
NEUTROS ABS: 4.4 10*3/uL (ref 1.7–7.7)
Neutrophils Relative %: 53 % (ref 43–77)
Platelets: 391 10*3/uL (ref 150–400)
RBC: 4.05 MIL/uL (ref 3.87–5.11)
RDW: 15.7 % — ABNORMAL HIGH (ref 11.5–15.5)
WBC: 8.3 10*3/uL (ref 4.0–10.5)

## 2014-08-29 LAB — PROTIME-INR
INR: 0.95 (ref 0.00–1.49)
PROTHROMBIN TIME: 12.7 s (ref 11.6–15.2)

## 2014-08-29 LAB — TROPONIN I

## 2014-08-29 LAB — PREGNANCY, URINE: Preg Test, Ur: NEGATIVE

## 2014-08-29 MED ORDER — ASPIRIN EC 325 MG PO TBEC
325.0000 mg | DELAYED_RELEASE_TABLET | Freq: Once | ORAL | Status: AC
  Administered 2014-08-30: 325 mg via ORAL
  Filled 2014-08-29: qty 1

## 2014-08-29 NOTE — ED Provider Notes (Signed)
CSN: [id number]     Arrival date & time 08/29/14  2053 History  This chart was scribed for Ruth Shanks, MD by Chester Holstein, ED Scribe. This patient was seen in room MH06/MH06 and the patient's care was started at 10:27 PM.      Chief Complaint  Patient presents with  . Allergic Reaction      Patient is a 49 y.o. female presenting with allergic reaction. The history is provided by the patient. No language interpreter was used.  Allergic Reaction  HPI Comments: Shima Compere is a 49 y.o. female with PMHx of HTN who presents to the Emergency Department complaining of allergic reaction with onset 10 min PTA. Pt was at [employer] when an associate placed a cream sample onto her right inner wrist. Pt states several minutes after she experienced several symptoms one after another starting with tingling and numbness in right hand radiating to forearm, then tingling in right foot and finally a strange sweet taste in her mouth and numbness in lips, lasting approximately 10 minutes total time. She states she wiped her mouth and noticed it produced a thick coated substance. Pt states sensation in foot was like "pins and needles". Pt has had a severe cough recently which has resolved. Pt has been taking Lisinopril for HTN since December. Pt notes FHx of MI, but no h/o CVA. Pt denies change in ambulation, weakness in extremities, fever, chills, urticaria, and pruritus. Pt reports symptoms have resolved.  Past Medical History  Diagnosis Date  . Hypertension    Past Surgical History  Procedure Laterality Date  . Cesarean section     History reviewed. No pertinent family history. History  Substance Use Topics  . Smoking status: Never Smoker   . Smokeless tobacco: Not on file  . Alcohol Use: No   OB History    No data available     Review of Systems 10 Systems reviewed and all are negative for acute change except as noted in the HPI.     Allergies  Review of patient's allergies  indicates no known allergies.  Home Medications   Prior to Admission medications   Medication Sig Start Date End Date Taking? Authorizing Provider  LISINOPRIL PO Take by mouth.   Yes Historical Provider, MD  benzonatate (TESSALON PERLES) 100 MG capsule Take 1 capsule (100 mg total) by mouth 3 (three) times daily as needed for cough. 04/12/13   Harvie Heck, PA-C  Homeopathic Products [facility] COLD REMEDY PO) Take 1 tablet by mouth every 3 (three) hours. Taking while she has a cold.    Historical Provider, MD  HYDROcodone-homatropine (HYCODAN) 5-1.5 MG/5ML syrup Take 5 mLs by mouth every 8 (eight) hours as needed for cough. 04/09/13   Glendell Docker, NP  ibuprofen (ADVIL,MOTRIN) 200 MG tablet Take 200 mg by mouth every 6 (six) hours as needed for mild pain.    Historical Provider, MD   BP 162/100 mmHg  Pulse 109  Temp(Src) 99.4 F (37.4 C) (Oral)  Resp 19  Ht 5\' 7"  (1.702 m)  Wt 220 lb (99.791 kg)  BMI 34.45 kg/m2  SpO2 100%  LMP 08/15/2014 Physical Exam  Constitutional: She is oriented to person, place, and time. She appears well-developed and well-nourished.  HENT:  Head: Normocephalic and atraumatic.  Eyes: EOM are normal. Pupils are equal, round, and reactive to light.  Neck: Neck supple.  Cardiovascular: Normal rate, regular rhythm, normal heart sounds and intact distal pulses.   Pulmonary/Chest: Effort normal and breath sounds  normal.  Abdominal: Soft. Bowel sounds are normal. She exhibits no distension. There is no tenderness.  Musculoskeletal: Normal range of motion. She exhibits no edema.  Neurological: She is alert and oriented to person, place, and time. She has normal strength. She displays normal reflexes. No cranial nerve deficit. She exhibits normal muscle tone. Coordination normal. GCS eye subscore is 4. GCS verbal subscore is 5. GCS motor subscore is 6.  Normal finger-nose examination bilaterally. Speech and cognition are intact. Cranial nerves 2 through 12 intact.  Sensation intact to light touch bilaterally. 5\5 motor strength in upper and lower extremity strength  Skin: Skin is warm, dry and intact.  Psychiatric: She has a normal mood and affect.    ED Course  Procedures (including critical care time) DIAGNOSTIC STUDIES: Oxygen Saturation is 100% on room air, normal by my interpretation.    COORDINATION OF CARE: 10:37 PM Discussed treatment plan with patient at beside, the patient agrees with the plan and has no further questions at this time.   Labs Review Labs Reviewed  BASIC METABOLIC PANEL - Abnormal; Notable for the following:    Potassium 3.3 (*)    Glucose, Bld 115 (*)    All other components within normal limits  CBC WITH DIFFERENTIAL/PLATELET - Abnormal; Notable for the following:    Hemoglobin 10.5 (*)    HCT 32.5 (*)    MCH 25.9 (*)    RDW 15.7 (*)    All other components within normal limits  URINALYSIS, ROUTINE W REFLEX MICROSCOPIC - Abnormal; Notable for the following:    Specific Gravity, Urine 1.003 (*)    All other components within normal limits  TROPONIN I  PROTIME-INR  PREGNANCY, URINE    Imaging Review Ct Head Wo Contrast  08/29/2014   CLINICAL DATA:  Allergic reaction. Tingling and numbness in right arm.  EXAM: CT HEAD WITHOUT CONTRAST  TECHNIQUE: Contiguous axial images were obtained from the base of the skull through the vertex without intravenous contrast.  COMPARISON:  None.  FINDINGS: Sinuses/Soft tissues: Clear paranasal sinuses and mastoid air cells.  Intracranial: No mass lesion, hemorrhage, hydrocephalus, acute infarct, intra-axial, or extra-axial fluid collection.  IMPRESSION: Normal head CT.   Electronically Signed   By: Abigail Miyamoto M.D.   On: 08/29/2014 23:23     EKG Interpretation   Date/Time:  Friday August 29 2014 23:04:55 EDT Ventricular Rate:  117 PR Interval:  146 QRS Duration: 72 QT Interval:  320 QTC Calculation: 446 R Axis:   15 Text Interpretation:  Sinus tachycardia Possible Left  atrial enlargement  Borderline ECG agree. no old comparison Confirmed by Johnney Killian, MD, Jeannie Done  [phone]) on 08/29/2014 11:27:06 PM     Consult: Leonel Ramsay of neurology's consult it and advises for admission and workup. MDM   Final diagnoses:  Transient cerebral ischemia, unspecified transient cerebral ischemia type  Essential hypertension   The patient presented with a unilateral episode of paresthesia numbness. The upper extremity in the leg. At this point findings are suspicious for TIA. Patient's case was reviewed Dr. Leonel Ramsay and she will be admitted at this time for further diagnostic workup. Currently the symptoms have resolved.     Ruth Shanks, MD 08/30/14 [phone]

## 2014-08-29 NOTE — ED Notes (Signed)
Pt reports that she developed a burning sensation to arm and hand several minutes after cream was applied, as she was leaving [employer] she stated that she became anxious, her lips became numb and she developed a sweet taste in her moth, which prompted her to drive straight to er for evaluation. At this time, she is currently not experiencing any symptoms. Numbness and tingling has resolved

## 2014-08-29 NOTE — ED Notes (Addendum)
Pt reports she was at [employer] when an associate placed a cream on her right inner wrist - "L'ave" pt reports noticing an immediate sensation of numbness, tingling, burning to her arms, hands, feet, and lips. Pt has not had Benadryl. No hives noted. No respiratory distress noted. Pt talking in full sentences without difficulty.

## 2014-08-30 ENCOUNTER — Observation Stay (HOSPITAL_COMMUNITY): Payer: BLUE CROSS/BLUE SHIELD

## 2014-08-30 DIAGNOSIS — G459 Transient cerebral ischemic attack, unspecified: Principal | ICD-10-CM

## 2014-08-30 DIAGNOSIS — Z86718 Personal history of other venous thrombosis and embolism: Secondary | ICD-10-CM | POA: Diagnosis not present

## 2014-08-30 DIAGNOSIS — I10 Essential (primary) hypertension: Secondary | ICD-10-CM | POA: Diagnosis not present

## 2014-08-30 DIAGNOSIS — R000 Tachycardia, unspecified: Secondary | ICD-10-CM | POA: Diagnosis not present

## 2014-08-30 LAB — LIPID PANEL
CHOL/HDL RATIO: 3.3 ratio
Cholesterol: 183 mg/dL (ref 0–200)
HDL: 56 mg/dL (ref >39)
LDL Cholesterol: 114 mg/dL — ABNORMAL HIGH (ref 0–99)
Triglycerides: 67 mg/dL (ref <150)
VLDL: 13 mg/dL (ref 0–40)

## 2014-08-30 LAB — GLUCOSE, CAPILLARY: Glucose-Capillary: 111 mg/dL — ABNORMAL HIGH (ref 70–99)

## 2014-08-30 LAB — TSH: TSH: 1.456 u[IU]/mL (ref 0.350–4.500)

## 2014-08-30 MED ORDER — ASPIRIN 325 MG PO TABS
325.0000 mg | ORAL_TABLET | Freq: Every day | ORAL | Status: DC
  Administered 2014-08-30 – 2014-08-31 (×2): 325 mg via ORAL
  Filled 2014-08-30 (×2): qty 1

## 2014-08-30 MED ORDER — ENOXAPARIN SODIUM 40 MG/0.4ML ~~LOC~~ SOLN
40.0000 mg | SUBCUTANEOUS | Status: DC
  Administered 2014-08-30 – 2014-08-31 (×2): 40 mg via SUBCUTANEOUS
  Filled 2014-08-30 (×2): qty 0.4

## 2014-08-30 MED ORDER — STROKE: EARLY STAGES OF RECOVERY BOOK
Freq: Once | Status: AC
  Administered 2014-08-30: 03:00:00
  Filled 2014-08-30: qty 1

## 2014-08-30 MED ORDER — HYDROMORPHONE HCL 1 MG/ML IJ SOLN: 0.5000 mg | INTRAMUSCULAR | Status: DC | PRN

## 2014-08-30 MED ORDER — LISINOPRIL 10 MG PO TABS
10.0000 mg | ORAL_TABLET | Freq: Every day | ORAL | Status: DC
  Administered 2014-08-30 – 2014-08-31 (×2): 10 mg via ORAL
  Filled 2014-08-30 (×2): qty 1

## 2014-08-30 MED ORDER — ACETAMINOPHEN 325 MG PO TABS
650.0000 mg | ORAL_TABLET | ORAL | Status: DC | PRN
  Administered 2014-08-30: 650 mg via ORAL

## 2014-08-30 NOTE — Progress Notes (Signed)
UR completed 

## 2014-08-30 NOTE — Progress Notes (Signed)
*  PRELIMINARY RESULTS* Echocardiogram 2D Echocardiogram has been performed.  Leavy Cella 08/30/2014, 12:25 PM

## 2014-08-30 NOTE — Progress Notes (Signed)
Stroke Team Progress Note  HISTORY 49 y.o. female with a history of hypertension who presents with paresthesias earlier today. She states that she was in [employer] and after checking out she had tingling and numbness of the right hand and spread up to involve the right side of her face. Short time later her right leg became numb as well. She had a mild headache, but no photophobia. She denies any history of migraines, denies any history of similar episodes in the past.  SUBJECTIVE Currently symptoms have resolved and pt is back to baseline.    OBJECTIVE Most recent Vital Signs: Filed Vitals:   08/30/14 0030 08/30/14 0150 08/30/14 0400 08/30/14 0540  BP: 160/99 144/91 148/93 123/83  Pulse:  115 116   Temp:  98.7 F (37.1 C) 99.1 F (37.3 C) 98.5 F (36.9 C)  TempSrc:  Oral Oral Oral  Resp: 13 18 19 20   Height:  5\' 7"  (1.702 m)    Weight:      SpO2:  100% 100% 100%   CBG (last 3)  No results for input(s): GLUCAP in the last 72 hours.  IV Fluid Intake:     MEDICATIONS  . aspirin  325 mg Oral Daily  . enoxaparin (LOVENOX) injection  40 mg Subcutaneous Q24H  . lisinopril  10 mg Oral Daily   PRN:  acetaminophen, HYDROmorphone (DILAUDID) injection  Diet:  Diet Heart Room service appropriate?: Yes; Fluid consistency:: Thin  Activity: Up with assistance  DVT Prophylaxis:  SCDs and heparin   CLINICALLY SIGNIFICANT STUDIES Basic Metabolic Panel:  Recent Labs Lab 08/29/14 2250  NA 137  K 3.3*  CL 105  CO2 27  GLUCOSE 115*  BUN 9  CREATININE 0.74  CALCIUM 8.5   Liver Function Tests: No results for input(s): AST, ALT, ALKPHOS, BILITOT, PROT, ALBUMIN in the last 168 hours. CBC:  Recent Labs Lab 08/29/14 2250  WBC 8.3  NEUTROABS 4.4  HGB 10.5*  HCT 32.5*  MCV 80.2  PLT 391   Coagulation:  Recent Labs Lab 08/29/14 2250  LABPROT 12.7  INR 0.95   Cardiac Enzymes:  Recent Labs Lab 08/29/14 2250  TROPONINI <0.03   Urinalysis:  Recent Labs Lab 08/29/14 2332   COLORURINE YELLOW  LABSPEC 1.003*  PHURINE 7.0  GLUCOSEU NEGATIVE  HGBUR NEGATIVE  BILIRUBINUR NEGATIVE  KETONESUR NEGATIVE  PROTEINUR NEGATIVE  UROBILINOGEN 0.2  NITRITE NEGATIVE  LEUKOCYTESUR NEGATIVE   Lipid Panel    Component Value Date/Time   CHOL 183 08/30/2014 0512   TRIG 67 08/30/2014 0512   HDL 56 08/30/2014 0512   CHOLHDL 3.3 08/30/2014 0512   VLDL 13 08/30/2014 0512   LDLCALC 114* 08/30/2014 0512   HgbA1C No results found for: HGBA1C  Urine Drug Screen:  No results found for: LABOPIA, COCAINSCRNUR, LABBENZ, AMPHETMU, THCU, LABBARB  Alcohol Level: No results for input(s): ETH in the last 168 hours.  Ct Head Wo Contrast  08/29/2014   CLINICAL DATA:  Allergic reaction. Tingling and numbness in right arm.  EXAM: CT HEAD WITHOUT CONTRAST  TECHNIQUE: Contiguous axial images were obtained from the base of the skull through the vertex without intravenous contrast.  COMPARISON:  None.  FINDINGS: Sinuses/Soft tissues: Clear paranasal sinuses and mastoid air cells.  Intracranial: No mass lesion, hemorrhage, hydrocephalus, acute infarct, intra-axial, or extra-axial fluid collection.  IMPRESSION: Normal head CT.   Electronically Signed   By: Abigail Miyamoto M.D.   On: 08/29/2014 23:23    CT of the brain  No  acute abnormality   MRI of the brain  Pending   MRA of the brain    Carotid Doppler  Pending   2D Echocardiogram  Pending   CXR    EKG  normal EKG, normal sinus rhythm, unchanged from previous tracings. For complete results please see formal report.   Therapy Recommendations none needed   Physical Exam   Physical Exam  Constitutional: Appears well-developed and well-nourished.  Psych: Affect appropriate to situation Eyes: No scleral injection HENT: No OP obstrucion Head: Normocephalic.  Cardiovascular: Normal rate and regular rhythm.  Respiratory: Effort normal and breath sounds normal to anterior ascultation GI: Soft. No distension. There is no tenderness.   Skin: WDI  Neuro: Mental Status: Patient is awake, alert, oriented to person, place, month, year, and situation. Patient is able to give a clear and coherent history. No signs of aphasia or neglect Cranial Nerves: II: Visual Fields are full. Pupils are equal, round, and reactive to light.  III,IV, VI: EOMI without ptosis or diploplia.  V: Facial sensation is symmetric to temperature VII: Facial movement is symmetric.  VIII: hearing is intact to voice Motor: Tone is normal. Bulk is normal. 5/5 strength was present in all four extremities.  Sensory: Sensation is symmetric to light touch in the arms and legs. Plantars: Toes are downgoing bilaterally.  Cerebellar: FNF intact bilaterally  ASSESSMENT [name] is a 49 y.o. female with R sided numbness/tingling that has resolved. Pt is currently back to baseline. No anti platelet use prior to admission.       Hospital day # 0  TREATMENT/PLAN  con't daily ASA and statin   Awaiting on 2d echo/carotids and MRI brain  If work up is complete and no signs of ischemia pt can be d/c even later today  S/p discussion with family and pt at bedside.     SIGNED [facility]    To contact Stroke Continuity provider, please refer to [email or web address] After hours, contact General Neurology

## 2014-08-30 NOTE — Progress Notes (Signed)
Chart reviewed. Patient examined. TIA workup underway. No return of symptoms.  Doree Barthel, MD [facility]

## 2014-08-30 NOTE — H&P (Signed)
[facility] Admission History and Physical       Ruth Conley RWE:[id number] DOB: Sep 06, 1965 DOA: 08/29/2014  Referring physician: EDP PCP: Annetta Maw, MD  Specialists:   Chief Complaint: Right Sided Numbness  HPI: Ruth Conley is a 49 y.o. female with a history of HTN who presented to the [facility] ED with complaints of sudden onset of Right sided numbness at 9  PM.  The numbness lasted 10 minutes.   She reports having a headache after the symptoms had resolved.   She denies having any previous similar symptoms.   She was seen at the ED and had a  CT scan of the Head which was negative for acute findings, and Neurology was consulted by the EDp and she was transferred to [facility] for a TIA workup.       Review of Systems:   Constitutional: No Weight Loss, No Weight Gain, Night Sweats, Fevers, Chills, Dizziness, Light Headedness, Fatigue, or Generalized Weakness HEENT:  +Headaches, Difficulty Swallowing,Tooth/Dental Problems,Sore Throat,  No Sneezing, Rhinitis, Ear Ache, Nasal Congestion, or Post Nasal Drip,  Cardio-vascular:  No Chest pain, Orthopnea, PND, Edema in Lower Extremities, Anasarca, Dizziness, +Palpitations  Resp: No Dyspnea, No DOE, No Productive Cough, No Non-Productive Cough, No Hemoptysis, No Wheezing.    GI: No Heartburn, Indigestion, Abdominal Pain, Nausea, Vomiting, Diarrhea, Constipation, Hematemesis, Hematochezia, Melena, Change in Bowel Habits,  Loss of Appetite  GU: No Dysuria, No Change in Color of Urine, No Urgency or Urinary Frequency, No Flank pain.  Musculoskeletal: No Joint Pain or Swelling, No Decreased Range of Motion, No Back Pain.  Neurologic: No Syncope, No Seizures, Muscle Weakness, +Right sided Paresthesia, Vision Disturbance or Loss, No Diplopia, No Vertigo, No Difficulty Walking,  Skin: No Rash or Lesions. Psych: No Change in Mood or Affect, No Depression or Anxiety, No Memory loss, No Confusion, or Hallucinations   Past  Medical History  Diagnosis Date  . Hypertension      Past Surgical History  Procedure Laterality Date  . Cesarean section        Prior to Admission medications   Medication Sig Start Date End Date Taking? Authorizing Provider  LISINOPRIL PO Take by mouth.   Yes Historical Provider, MD  benzonatate (TESSALON PERLES) 100 MG capsule Take 1 capsule (100 mg total) by mouth 3 (three) times daily as needed for cough. 04/12/13   Harvie Heck, PA-C  Homeopathic Products [facility] COLD REMEDY PO) Take 1 tablet by mouth every 3 (three) hours. Taking while she has a cold.    Historical Provider, MD  HYDROcodone-homatropine (HYCODAN) 5-1.5 MG/5ML syrup Take 5 mLs by mouth every 8 (eight) hours as needed for cough. 04/09/13   Glendell Docker, NP  ibuprofen (ADVIL,MOTRIN) 200 MG tablet Take 200 mg by mouth every 6 (six) hours as needed for mild pain.    Historical Provider, MD     No Known Allergies  Social History:  reports that she has never smoked. She does not have any smokeless tobacco history on file. She reports that she does not drink alcohol or use illicit drugs.     Family History:      Mother had HTN and DM2   Father Died in his 21's of CAD/MI   Physical Exam:  GEN:  Pleasant  Obese 49 y.o. African American female examined and in no acute distress; cooperative with exam Filed Vitals:   08/29/14 2337 08/30/14 0009 08/30/14 0030 08/30/14 0150  BP: 162/100  160/99 144/91  Pulse: 109  115  Temp:  98.6 F (37 C)  98.7 F (37.1 C)  TempSrc:    Oral  Resp: 19  13 18   Height:    5\' 7"  (1.702 m)  Weight:      SpO2: 100%   100%   Blood pressure 144/91, pulse 115, temperature 98.7 F (37.1 C), temperature source Oral, resp. rate 18, height 5\' 7"  (1.702 m), weight 99.791 kg (220 lb), last menstrual period 08/15/2014, SpO2 100 %. PSYCH: She is alert and oriented x4; does not appear anxious does not appear depressed; affect is normal HEENT: Normocephalic and Atraumatic, Mucous  membranes pink; PERRLA; EOM intact; Fundi:  Benign;  No scleral icterus, Nares: Patent, Oropharynx: Clear, Fair Dentition,    Neck:  FROM, No Cervical Lymphadenopathy nor Thyromegaly or Carotid Bruit; No JVD; Breasts:: Not examined CHEST WALL: No tenderness CHEST: Normal respiration, clear to auscultation bilaterally HEART: Regular rate and rhythm; no murmurs rubs or gallops BACK: No kyphosis or scoliosis; No CVA tenderness ABDOMEN: Positive Bowel Sounds, Obese, Soft Non-Tender, No Rebound or Guarding; No Masses, No Organomegaly, No Pannus; No Intertriginous candida. Rectal Exam: Not done EXTREMITIES: No Cyanosis, Clubbing, or Edema; No Ulcerations. Genitalia: not examined PULSES: 2+ and symmetric SKIN: Normal hydration no rash or ulceration  CNS:  Alert and Oriented x 4, No Focal Deficits Mental Status:  Alert, Oriented, Thought Content Appropriate. Speech Fluent without evidence of Aphasia. Able to follow 3 step commands without difficulty.  In No obvious pain.   Cranial Nerves:  II: Discs flat bilaterally; Visual fields Intact, Pupils equal and reactive.    III,IV, VI: Extra-ocular motions intact bilaterally    V,VII: smile symmetric, facial light touch sensation normal bilaterally    VIII: hearing intact bilaterally    IX,X: gag reflex present    XI: bilateral shoulder shrug    XII: midline tongue extension   Motor:  Right:  Upper extremity 5/5     Left:  Upper extremity 5/5     Right:  Lower extremity 5/5    Left:  Lower extremity 5/5     Tone and Bulk:  normal tone throughout; no atrophy noted   Sensory:  Pinprick and light touch intact throughout, bilaterally   Deep Tendon Reflexes: 2+ and symmetric throughout   Plantars/ Babinski:  Right:  normal Left: normal    Cerebellar:  Finger to nose without difficulty.   Gait: deferred    Vascular: pulses palpable throughout    Labs on Admission:  Basic Metabolic Panel:  Recent Labs Lab 08/29/14 2250  NA 137  K 3.3*    CL 105  CO2 27  GLUCOSE 115*  BUN 9  CREATININE 0.74  CALCIUM 8.5   Liver Function Tests: No results for input(s): AST, ALT, ALKPHOS, BILITOT, PROT, ALBUMIN in the last 168 hours. No results for input(s): LIPASE, AMYLASE in the last 168 hours. No results for input(s): AMMONIA in the last 168 hours. CBC:  Recent Labs Lab 08/29/14 2250  WBC 8.3  NEUTROABS 4.4  HGB 10.5*  HCT 32.5*  MCV 80.2  PLT 391   Cardiac Enzymes:  Recent Labs Lab 08/29/14 2250  TROPONINI <0.03    BNP (last 3 results) No results for input(s): BNP in the last 8760 hours.  ProBNP (last 3 results) No results for input(s): PROBNP in the last 8760 hours.  CBG: No results for input(s): GLUCAP in the last 168 hours.  Radiological Exams on Admission: Ct Head Wo Contrast  08/29/2014   CLINICAL DATA:  Allergic reaction. Tingling and numbness in right arm.  EXAM: CT HEAD WITHOUT CONTRAST  TECHNIQUE: Contiguous axial images were obtained from the base of the skull through the vertex without intravenous contrast.  COMPARISON:  None.  FINDINGS: Sinuses/Soft tissues: Clear paranasal sinuses and mastoid air cells.  Intracranial: No mass lesion, hemorrhage, hydrocephalus, acute infarct, intra-axial, or extra-axial fluid collection.  IMPRESSION: Normal head CT.   Electronically Signed   By: Abigail Miyamoto M.D.   On: 08/29/2014 23:23     EKG: Independently reviewed. Sinus Tachycardia  Rate =115 No Acute S-T changes   Assessment/Plan:   49 y.o. female with  Principal Problem:   1.   TIA (transient ischemic attack)   TIA Workup initiated    MRI/MRA Brain , 2D ECHO, Carotid US ordered    Check Fasting Lipids, and HbA1C    Neuro Checks    ASA   Active Problems:   2.   Essential hypertension-   Continue Lisinopril   Monitor BPs    3.  Sinus Tachycardia-   Check TSH    4.   DVT Prophylaxis    Lovenox          Code Status:     FULL CODE      Family Communication:   No Family Present     Disposition Plan:   Observation Status        Time spent:  [place] [facility] Pager [phone]   If [place] Please Contact the Day Rounding Team MD for [facility]  If 7PM-7AM, Please Contact Night-Floor Coverage  [email or web address] Password [email or web address] 08/30/2014, 2:30 AM     ADDENDUM:   Patient was seen and examined on 08/30/2014

## 2014-08-30 NOTE — Progress Notes (Signed)
Physician notified about the admission.

## 2014-08-30 NOTE — Consult Note (Signed)
Neurology Consultation Reason for Consult: Paresthesias Referring Physician: Calla Kicks  CC: Paresthesias  History is obtained from: Patient  HPI: Ruth Conley is a 49 y.o. female with a history of hypertension who presents with paresthesias earlier today. She states that she was in [employer] and after checking out she had tingling and numbness of the right hand and spread up to involve the right side of her face. Short time later her right leg became numb as well. She had a mild headache, but no photophobia. She denies any history of migraines, denies any history of similar episodes in the past.   LKW: 9 PM tpa given?: no, out of window    ROS: A 14 point ROS was performed and is negative except as noted in the HPI.   Past Medical History  Diagnosis Date  . Hypertension     Family History: CAD Hypertension  Social History: Tob: Denies  Exam: Current vital signs: BP 144/91 mmHg  Pulse 115  Temp(Src) 98.7 F (37.1 C) (Oral)  Resp 18  Ht 5\' 7"  (1.702 m)  Wt 99.791 kg (220 lb)  BMI 34.45 kg/m2  SpO2 100%  LMP 08/15/2014 Vital signs in last 24 hours: Temp:  [98.6 F (37 C)-99.4 F (37.4 C)] 98.7 F (37.1 C) (04/30 0150) Pulse Rate:  [109-118] 115 (04/30 0150) Resp:  [13-19] 18 (04/30 0150) BP: (144-170)/(91-103) 144/91 mmHg (04/30 0150) SpO2:  [100 %] 100 % (04/30 0150) Weight:  [99.791 kg (220 lb)] 99.791 kg (220 lb) (04/29 2059)  Physical Exam  Constitutional: Appears well-developed and well-nourished.  Psych: Affect appropriate to situation Eyes: No scleral injection HENT: No OP obstrucion Head: Normocephalic.  Cardiovascular: Normal rate and regular rhythm.  Respiratory: Effort normal and breath sounds normal to anterior ascultation GI: Soft.  No distension. There is no tenderness.  Skin: WDI  Neuro: Mental Status: Patient is awake, alert, oriented to person, place, month, year, and situation. Patient is able to give a clear and coherent  history. No signs of aphasia or neglect Cranial Nerves: II: Visual Fields are full. Pupils are equal, round, and reactive to light.   III,IV, VI: EOMI without ptosis or diploplia.  V: Facial sensation is symmetric to temperature VII: Facial movement is symmetric.  VIII: hearing is intact to voice Motor: Tone is normal. Bulk is normal. 5/5 strength was present in all four extremities.  Sensory: Sensation is symmetric to light touch  in the arms and legs. Plantars: Toes are downgoing bilaterally.  Cerebellar: FNF intact bilaterally   I have reviewed labs in epic and the results pertinent to this consultation are: BMP-unremarkable  I have reviewed the images obtained: CT head-unremarkable  Impression: 49 year old female with transient right-sided numbness and tingling. Though the spread pattern could be concerning for possible migraine aura, without a history of migraines without a clear migraine type headache following this, I think that thalamic TIA would be more likely. She does have a history of hypertension which could lead her to small vessel disease. I would favor pursuing an evaluation for TIA at this time.  Recommendations: 1. HgbA1c, fasting lipid panel 2. MRI, MRA  of the brain without contrast 3. Frequent neuro checks 4. Echocardiogram 5. Carotid dopplers 6. Prophylactic therapy-Antiplatelet med: Aspirin - dose 325mg  PO or 300mg  PR 7. Risk factor modification 8. Telemetry monitoring 9. PT consult, OT consult, Speech consult   Roland Rack, MD [facility] [phone]  If 7pm- 7am, please page neurology on call as listed in [place].

## 2014-08-30 NOTE — Progress Notes (Signed)
MD Conley Canal paged with results of patient EKG: sinus tachycardia w/ possible left atrial enlargement and left ventricular hypertrophy.

## 2014-08-30 NOTE — Progress Notes (Signed)
MD Conley Canal paged, central telemetry called, patients HR went up to the 150's, now back down to 120's, asked to call if any intervention.

## 2014-08-31 DIAGNOSIS — G458 Other transient cerebral ischemic attacks and related syndromes: Secondary | ICD-10-CM | POA: Diagnosis not present

## 2014-08-31 DIAGNOSIS — I10 Essential (primary) hypertension: Secondary | ICD-10-CM

## 2014-08-31 DIAGNOSIS — G459 Transient cerebral ischemic attack, unspecified: Secondary | ICD-10-CM | POA: Diagnosis not present

## 2014-08-31 LAB — GLUCOSE, CAPILLARY
GLUCOSE-CAPILLARY: 112 mg/dL — AB (ref 70–99)
Glucose-Capillary: 80 mg/dL (ref 70–99)

## 2014-08-31 MED ORDER — ROSUVASTATIN CALCIUM 10 MG PO TABS: 10.0000 mg | ORAL_TABLET | Freq: Every day | ORAL | Status: DC

## 2014-08-31 MED ORDER — ASPIRIN 325 MG PO TABS: 325.0000 mg | ORAL_TABLET | Freq: Every day | ORAL | Status: DC

## 2014-08-31 MED ORDER — LORATADINE 10 MG PO TABS
10.0000 mg | ORAL_TABLET | Freq: Every day | ORAL | Status: DC
  Administered 2014-08-31: 10 mg via ORAL
  Filled 2014-08-31: qty 1

## 2014-08-31 MED ORDER — METOPROLOL SUCCINATE ER 25 MG PO TB24: 25.0000 mg | ORAL_TABLET | Freq: Every day | ORAL | Status: DC

## 2014-08-31 NOTE — Progress Notes (Signed)
Utilization review completed.  

## 2014-08-31 NOTE — Discharge Summary (Signed)
Physician Discharge Summary  Charo Philipp XIP:[id number] DOB: 1965-12-26 DOA: 08/29/2014  PCP: Annetta Maw, MD  Admit date: 08/29/2014 Discharge date: 08/31/2014  Recommendations for Outpatient Follow-up:  1. Carotid dopplers in one year  Discharge Diagnoses:  Principal Problem:   TIA (transient ischemic attack) Active Problems:   Essential hypertension   Discharge Condition: stable  Diet recommendation: heart healthy  Filed Weights   08/29/14 2059  Weight: 99.791 kg (220 lb)    History of present illness:  49 y.o. female with a history of HTN who presented to the [facility] ED with complaints of sudden onset of Right sided numbness at 9 PM. The numbness lasted 10 minutes. She reports having a headache after the symptoms had resolved.  She denies having any previous similar symptoms. She was seen at the ED and had a CT scan of the Head which was negative for acute findings, and Neurology was consulted by the EDp and she was transferred to [facility] for a TIA workup.   Hospital Course:  Admitted to telemetry. No return of symptoms.  Carotid dopplers with right 40-59% ICA stenosis. Left ICA 1-39% stenosis.  Echo without source of embolus MRI, MRA without stroke or stenosis Results for LAKEYIA, SURBER (MRN [id number]) as of 09/07/2014 22:02  Ref. Range 08/30/2014 05:12  Cholesterol Latest Ref Range: 0-200 mg/dL 183  Triglycerides Latest Ref Range: <150 mg/dL 67  HDL Cholesterol Latest Ref Range: >39 mg/dL 56  LDL (calc) Latest Ref Range: 0-99 mg/dL 114 (H)  VLDL Latest Ref Range: 0-40 mg/dL 13  Total CHOL/HDL Ratio Latest Units: RATIO 3.3   hgb a1c 6.0  Procedures:  none  Consultations:  neurology  Discharge Exam: Filed Vitals:   08/31/14 1024  BP: 127/77  Pulse: 100  Temp: 98.5 F (36.9 C)  Resp: 20    General: a and o Cardiovascular: RRR Respiratory: CTA Neuro: nonfocal  Discharge Instructions   Discharge Instructions    Activity as  tolerated - No restrictions    Complete by:  As directed      Diet - low sodium heart healthy    Complete by:  As directed           Current Discharge Medication List    START taking these medications   Details  aspirin 325 MG tablet Take 1 tablet (325 mg total) by mouth daily.    metoprolol succinate (TOPROL XL) 25 MG 24 hr tablet Take 1 tablet (25 mg total) by mouth daily. Qty: 30 tablet, Refills: 1    rosuvastatin (CRESTOR) 10 MG tablet Take 1 tablet (10 mg total) by mouth daily. Qty: 30 tablet, Refills: 1      CONTINUE these medications which have NOT CHANGED   Details  Calcium Carb-Cholecalciferol (CALCIUM 1000 + D PO) Take 1 Applicatorful by mouth daily.    Coenzyme Q-10 100 MG capsule Take 100 mg by mouth daily.    ibuprofen (ADVIL,MOTRIN) 200 MG tablet Take 200 mg by mouth every 6 (six) hours as needed for mild pain.    Omega 3 1200 MG CAPS Take 600 mg by mouth daily.      STOP taking these medications     lisinopril (PRINIVIL,ZESTRIL) 10 MG tablet      LISINOPRIL PO      benzonatate (TESSALON PERLES) 100 MG capsule      HYDROcodone-homatropine (HYCODAN) 5-1.5 MG/5ML syrup        No Known Allergies Follow-up Information    Follow up with Annetta Maw, MD In  3 weeks.   Specialty:  Internal Medicine   Why:  to check potassium, blood pressure, heart rate. also monitor liver function tests/cholesterol in 3 months   Contact information:   [place] [address] [phone]        The results of significant diagnostics from this hospitalization (including imaging, microbiology, ancillary and laboratory) are listed below for reference.    Significant Diagnostic Studies: Ct Head Wo Contrast  08/29/2014   CLINICAL DATA:  Allergic reaction. Tingling and numbness in right arm.  EXAM: CT HEAD WITHOUT CONTRAST  TECHNIQUE: Contiguous axial images were obtained from the base of the skull through the vertex without intravenous contrast.   COMPARISON:  None.  FINDINGS: Sinuses/Soft tissues: Clear paranasal sinuses and mastoid air cells.  Intracranial: No mass lesion, hemorrhage, hydrocephalus, acute infarct, intra-axial, or extra-axial fluid collection.  IMPRESSION: Normal head CT.   Electronically Signed   By: Abigail Miyamoto M.D.   On: 08/29/2014 23:23   Mri Brain Without Contrast  08/30/2014   CLINICAL DATA:  Sudden onset of right-sided numbness on 08/29/2014.  EXAM: MRI HEAD WITHOUT CONTRAST  MRA HEAD WITHOUT CONTRAST  TECHNIQUE: Multiplanar, multiecho pulse sequences of the brain and surrounding structures were obtained without intravenous contrast. Angiographic images of the head were obtained using MRA technique without contrast.  COMPARISON:  CT 08/29/2014  FINDINGS: MRI HEAD FINDINGS  Diffusion imaging does not show any acute or subacute infarction. The brainstem and cerebellum are normal. The cerebral hemispheres are normal except for a few scattered punctate foci of T2 and FLAIR signal in the white matter, not likely to be of clinical relevance. No large vessel territory infarction. No mass lesion, hemorrhage, hydrocephalus or extra-axial collection. No pituitary mass. No inflammatory sinus disease. No skull or skullbase lesion.  MRA HEAD FINDINGS  Both internal carotid arteries are widely patent into the brain. The anterior and middle cerebral vessels are patent and normal without proximal stenosis, aneurysm or vascular malformation. Both vertebral arteries are widely patent to the basilar. No basilar stenosis. Posterior circulation branch vessels are normal.  IMPRESSION: MR angiography: Normal.  MRI head: No acute or significant finding. Few tiny white matter foci and not likely of clinical relevance.   Electronically Signed   By: Nelson Chimes M.D.   On: 08/30/2014 16:19   Mr Jodene Nam Head/brain Wo Cm  08/30/2014   CLINICAL DATA:  Sudden onset of right-sided numbness on 08/29/2014.  EXAM: MRI HEAD WITHOUT CONTRAST  MRA HEAD WITHOUT CONTRAST   TECHNIQUE: Multiplanar, multiecho pulse sequences of the brain and surrounding structures were obtained without intravenous contrast. Angiographic images of the head were obtained using MRA technique without contrast.  COMPARISON:  CT 08/29/2014  FINDINGS: MRI HEAD FINDINGS  Diffusion imaging does not show any acute or subacute infarction. The brainstem and cerebellum are normal. The cerebral hemispheres are normal except for a few scattered punctate foci of T2 and FLAIR signal in the white matter, not likely to be of clinical relevance. No large vessel territory infarction. No mass lesion, hemorrhage, hydrocephalus or extra-axial collection. No pituitary mass. No inflammatory sinus disease. No skull or skullbase lesion.  MRA HEAD FINDINGS  Both internal carotid arteries are widely patent into the brain. The anterior and middle cerebral vessels are patent and normal without proximal stenosis, aneurysm or vascular malformation. Both vertebral arteries are widely patent to the basilar. No basilar stenosis. Posterior circulation branch vessels are normal.  IMPRESSION: MR angiography: Normal.  MRI head: No acute  or significant finding. Few tiny white matter foci and not likely of clinical relevance.   Electronically Signed   By: Nelson Chimes M.D.   On: 08/30/2014 16:19   Echo Left ventricle: The cavity size was normal. Wall thickness was normal. Systolic function was normal. The estimated ejection fraction was in the range of 55% to 60%. Wall motion was normal; there were no regional wall motion abnormalities. Due to tachycardia, there was fusion of early and atrial contributions to ventricular filling. The study is not technically sufficient to allow evaluation of LV diastolic function. - Left atrium: The atrium was mildly dilated.  Microbiology: No results found for this or any previous visit (from the past 240 hour(s)).   Labs: Basic Metabolic Panel:  Recent Labs Lab 08/29/14 2250   NA 137  K 3.3*  CL 105  CO2 27  GLUCOSE 115*  BUN 9  CREATININE 0.74  CALCIUM 8.5   Liver Function Tests: No results for input(s): AST, ALT, ALKPHOS, BILITOT, PROT, ALBUMIN in the last 168 hours. No results for input(s): LIPASE, AMYLASE in the last 168 hours. No results for input(s): AMMONIA in the last 168 hours. CBC:  Recent Labs Lab 08/29/14 2250  WBC 8.3  NEUTROABS 4.4  HGB 10.5*  HCT 32.5*  MCV 80.2  PLT 391   Cardiac Enzymes:  Recent Labs Lab 08/29/14 2250  TROPONINI <0.03   BNP: BNP (last 3 results) No results for input(s): BNP in the last 8760 hours.  ProBNP (last 3 results) No results for input(s): PROBNP in the last 8760 hours.  CBG:  Recent Labs Lab 08/30/14 2121 08/31/14 0812 08/31/14 1243  GLUCAP 111* 80 112*       Signed:  [place]  [facility] 08/31/2014, 2:44 PM

## 2014-08-31 NOTE — Progress Notes (Signed)
Ruth Conley discharged Home per MD order.  Discharge instructions reviewed and discussed with the patient, all questions and concerns answered. Copy of instructions, care notes for TIA, risk factors for CVA, new medications & low CHOLESTEROL diet and scripts given to patient.    Medication List    STOP taking these medications        benzonatate 100 MG capsule  Commonly known as:  TESSALON PERLES     HYDROcodone-homatropine 5-1.5 MG/5ML syrup  Commonly known as:  HYCODAN     lisinopril 10 MG tablet  Commonly known as:  PRINIVIL,ZESTRIL      TAKE these medications        aspirin 325 MG tablet  Take 1 tablet (325 mg total) by mouth daily.     CALCIUM 1000 + D PO  Take 1 Applicatorful by mouth daily.     Coenzyme Q-10 100 MG capsule  Take 100 mg by mouth daily.     ibuprofen 200 MG tablet  Commonly known as:  ADVIL,MOTRIN  Take 200 mg by mouth every 6 (six) hours as needed for mild pain.     metoprolol succinate 25 MG 24 hr tablet  Commonly known as:  TOPROL XL  Take 1 tablet (25 mg total) by mouth daily.     Omega 3 1200 MG Caps  Take 600 mg by mouth daily.     rosuvastatin 10 MG tablet  Commonly known as:  CRESTOR  Take 1 tablet (10 mg total) by mouth daily.        Patients skin is clean, dry and intact, no evidence of skin break down. IV site discontinued and catheter remains intact. Site without signs and symptoms of complications. Dressing and pressure applied.  Patient escorted to car by NT in a wheelchair,  no distress noted upon discharge.  Wynetta Emery, [name] C 08/31/2014 4:36 PM

## 2014-08-31 NOTE — Progress Notes (Signed)
Mrs. Ruth Conley is having some sinus pressure this am, takes zyrtec at home at times. Text paged Dr. Conley Canal to see if she can you order something.  Will continue monitor and await for new orders or return call.  Alphonzo Lemmings, RN

## 2014-08-31 NOTE — Progress Notes (Signed)
Stroke Team Progress Note  HISTORY 49 y.o. female with a history of hypertension who presents with paresthesias earlier today. She states that she was in [employer] and after checking out she had tingling and numbness of the right hand and spread up to involve the right side of her face. Short time later her right leg became numb as well. She had a mild headache, but no photophobia. She denies any history of migraines, denies any history of similar episodes in the past.  SUBJECTIVE Currently symptoms have resolved and pt is back to baseline.    OBJECTIVE Most recent Vital Signs: Filed Vitals:   08/30/14 2333 08/31/14 0038 08/31/14 0450 08/31/14 1024  BP: 115/41 135/85 124/83 127/77  Pulse:  96  100  Temp: 98.7 F (37.1 C) 99.4 F (37.4 C) 98.6 F (37 C) 98.5 F (36.9 C)  TempSrc: Oral Oral Oral Oral  Resp: 16 17 16 20   Height:      Weight:      SpO2: 95% 98% 100% 99%   CBG (last 3)   Recent Labs  08/30/14 2121 08/31/14 0812  GLUCAP 111* 80    IV Fluid Intake:     MEDICATIONS  . aspirin  325 mg Oral Daily  . enoxaparin (LOVENOX) injection  40 mg Subcutaneous Q24H  . lisinopril  10 mg Oral Daily  . loratadine  10 mg Oral Daily   PRN:  acetaminophen, HYDROmorphone (DILAUDID) injection  Diet:  Diet Heart Room service appropriate?: Yes; Fluid consistency:: Thin  Activity: Up with assistance  DVT Prophylaxis:  SCDs and heparin   CLINICALLY SIGNIFICANT STUDIES Basic Metabolic Panel:   Recent Labs Lab 08/29/14 2250  NA 137  K 3.3*  CL 105  CO2 27  GLUCOSE 115*  BUN 9  CREATININE 0.74  CALCIUM 8.5   Liver Function Tests: No results for input(s): AST, ALT, ALKPHOS, BILITOT, PROT, ALBUMIN in the last 168 hours. CBC:   Recent Labs Lab 08/29/14 2250  WBC 8.3  NEUTROABS 4.4  HGB 10.5*  HCT 32.5*  MCV 80.2  PLT 391   Coagulation:   Recent Labs Lab 08/29/14 2250  LABPROT 12.7  INR 0.95   Cardiac Enzymes:   Recent Labs Lab 08/29/14 2250  TROPONINI  <0.03   Urinalysis:   Recent Labs Lab 08/29/14 2332  COLORURINE YELLOW  LABSPEC 1.003*  PHURINE 7.0  GLUCOSEU NEGATIVE  HGBUR NEGATIVE  BILIRUBINUR NEGATIVE  KETONESUR NEGATIVE  PROTEINUR NEGATIVE  UROBILINOGEN 0.2  NITRITE NEGATIVE  LEUKOCYTESUR NEGATIVE   Lipid Panel    Component Value Date/Time   CHOL 183 08/30/2014 0512   TRIG 67 08/30/2014 0512   HDL 56 08/30/2014 0512   CHOLHDL 3.3 08/30/2014 0512   VLDL 13 08/30/2014 0512   LDLCALC 114* 08/30/2014 0512   HgbA1C No results found for: HGBA1C  Urine Drug Screen:  No results found for: LABOPIA, COCAINSCRNUR, LABBENZ, AMPHETMU, THCU, LABBARB  Alcohol Level: No results for input(s): ETH in the last 168 hours.  Ct Head Wo Contrast  08/29/2014   CLINICAL DATA:  Allergic reaction. Tingling and numbness in right arm.  EXAM: CT HEAD WITHOUT CONTRAST  TECHNIQUE: Contiguous axial images were obtained from the base of the skull through the vertex without intravenous contrast.  COMPARISON:  None.  FINDINGS: Sinuses/Soft tissues: Clear paranasal sinuses and mastoid air cells.  Intracranial: No mass lesion, hemorrhage, hydrocephalus, acute infarct, intra-axial, or extra-axial fluid collection.  IMPRESSION: Normal head CT.   Electronically Signed   By: Marylyn Ishihara  Jobe Igo M.D.   On: 08/29/2014 23:23   Mri Brain Without Contrast  08/30/2014   CLINICAL DATA:  Sudden onset of right-sided numbness on 08/29/2014.  EXAM: MRI HEAD WITHOUT CONTRAST  MRA HEAD WITHOUT CONTRAST  TECHNIQUE: Multiplanar, multiecho pulse sequences of the brain and surrounding structures were obtained without intravenous contrast. Angiographic images of the head were obtained using MRA technique without contrast.  COMPARISON:  CT 08/29/2014  FINDINGS: MRI HEAD FINDINGS  Diffusion imaging does not show any acute or subacute infarction. The brainstem and cerebellum are normal. The cerebral hemispheres are normal except for a few scattered punctate foci of T2 and FLAIR signal in  the white matter, not likely to be of clinical relevance. No large vessel territory infarction. No mass lesion, hemorrhage, hydrocephalus or extra-axial collection. No pituitary mass. No inflammatory sinus disease. No skull or skullbase lesion.  MRA HEAD FINDINGS  Both internal carotid arteries are widely patent into the brain. The anterior and middle cerebral vessels are patent and normal without proximal stenosis, aneurysm or vascular malformation. Both vertebral arteries are widely patent to the basilar. No basilar stenosis. Posterior circulation branch vessels are normal.  IMPRESSION: MR angiography: Normal.  MRI head: No acute or significant finding. Few tiny white matter foci and not likely of clinical relevance.   Electronically Signed   By: Nelson Chimes M.D.   On: 08/30/2014 16:19   Mr Jodene Nam Head/brain Wo Cm  08/30/2014   CLINICAL DATA:  Sudden onset of right-sided numbness on 08/29/2014.  EXAM: MRI HEAD WITHOUT CONTRAST  MRA HEAD WITHOUT CONTRAST  TECHNIQUE: Multiplanar, multiecho pulse sequences of the brain and surrounding structures were obtained without intravenous contrast. Angiographic images of the head were obtained using MRA technique without contrast.  COMPARISON:  CT 08/29/2014  FINDINGS: MRI HEAD FINDINGS  Diffusion imaging does not show any acute or subacute infarction. The brainstem and cerebellum are normal. The cerebral hemispheres are normal except for a few scattered punctate foci of T2 and FLAIR signal in the white matter, not likely to be of clinical relevance. No large vessel territory infarction. No mass lesion, hemorrhage, hydrocephalus or extra-axial collection. No pituitary mass. No inflammatory sinus disease. No skull or skullbase lesion.  MRA HEAD FINDINGS  Both internal carotid arteries are widely patent into the brain. The anterior and middle cerebral vessels are patent and normal without proximal stenosis, aneurysm or vascular malformation. Both vertebral arteries are widely  patent to the basilar. No basilar stenosis. Posterior circulation branch vessels are normal.  IMPRESSION: MR angiography: Normal.  MRI head: No acute or significant finding. Few tiny white matter foci and not likely of clinical relevance.   Electronically Signed   By: Nelson Chimes M.D.   On: 08/30/2014 16:19    CT of the brain  No acute abnormality   MRI of the brain No acute abnormality   MRA of the brain    Carotid Doppler  Pending   2D Echocardiogram  LV EF: 55% -  60%  CXR    EKG  normal EKG, normal sinus rhythm, unchanged from previous tracings. For complete results please see formal report.   Therapy Recommendations none needed   Physical Exam   Physical Exam  Constitutional: Appears well-developed and well-nourished.  Psych: Affect appropriate to situation Eyes: No scleral injection HENT: No OP obstrucion Head: Normocephalic.  Cardiovascular: Normal rate and regular rhythm.  Respiratory: Effort normal and breath sounds normal to anterior ascultation GI: Soft. No distension. There is no tenderness.  Skin: WDI  Neuro: Mental Status: Patient is awake, alert, oriented to person, place, month, year, and situation. Patient is able to give a clear and coherent history. No signs of aphasia or neglect Cranial Nerves: II: Visual Fields are full. Pupils are equal, round, and reactive to light.  III,IV, VI: EOMI without ptosis or diploplia.  V: Facial sensation is symmetric to temperature VII: Facial movement is symmetric.  VIII: hearing is intact to voice Motor: Tone is normal. Bulk is normal. 5/5 strength was present in all four extremities.  Sensory: Sensation is symmetric to light touch in the arms and legs. Plantars: Toes are downgoing bilaterally.  Cerebellar: FNF intact bilaterally  ASSESSMENT [name] is a 49 y.o. female with R sided numbness/tingling that has resolved. Pt is currently back to baseline. No anti platelet use prior to  admission.       Hospital day # 1  TREATMENT/PLAN  con't daily ASA and statin   MRI brain no acute abnormality   Complete TIA work up and then d/c  Will s/off please call with questions.     SIGNED Leotis Pain    To contact Stroke Continuity provider, please refer to [email or web address] After hours, contact General Neurology

## 2014-08-31 NOTE — Progress Notes (Signed)
*  PRELIMINARY RESULTS* Vascular Ultrasound Carotid Duplex (Doppler) has been completed.  Preliminary findings: Right = 40-59% ICA stenosis, based on velocities. There is no plaque morphology to support increased velocities. Left = 1-39% ICA stenosis.  Landry Mellow, RDMS, RVT  08/31/2014, 1:26 PM

## 2014-09-01 LAB — HEMOGLOBIN A1C
Hgb A1c MFr Bld: 6 % — ABNORMAL HIGH (ref 4.8–5.6)
MEAN PLASMA GLUCOSE: 126 mg/dL

## 2014-09-25 ENCOUNTER — Other Ambulatory Visit: Payer: Self-pay

## 2014-09-25 DIAGNOSIS — Z1231 Encounter for screening mammogram for malignant neoplasm of breast: Secondary | ICD-10-CM

## 2014-09-26 ENCOUNTER — Ambulatory Visit
Admission: RE | Admit: 2014-09-26 | Discharge: 2014-09-26 | Disposition: A | Discharge status: Home / Self Care | Payer: BLUE CROSS/BLUE SHIELD | Source: Ambulatory Visit

## 2014-09-26 DIAGNOSIS — Z1231 Encounter for screening mammogram for malignant neoplasm of breast: Secondary | ICD-10-CM

## 2014-10-20 ENCOUNTER — Emergency Department (HOSPITAL_BASED_OUTPATIENT_CLINIC_OR_DEPARTMENT_OTHER)
Admission: EM | Admit: 2014-10-20 | Discharge: 2014-10-20 | Disposition: A | Discharge status: Home / Self Care | Payer: BLUE CROSS/BLUE SHIELD | Attending: Emergency Medicine | Admitting: Emergency Medicine

## 2014-10-20 ENCOUNTER — Encounter (HOSPITAL_BASED_OUTPATIENT_CLINIC_OR_DEPARTMENT_OTHER): Payer: Self-pay | Admitting: *Deleted

## 2014-10-20 DIAGNOSIS — R109 Unspecified abdominal pain: Secondary | ICD-10-CM | POA: Insufficient documentation

## 2014-10-20 DIAGNOSIS — I10 Essential (primary) hypertension: Secondary | ICD-10-CM | POA: Insufficient documentation

## 2014-10-20 DIAGNOSIS — Z7982 Long term (current) use of aspirin: Secondary | ICD-10-CM | POA: Insufficient documentation

## 2014-10-20 DIAGNOSIS — M549 Dorsalgia, unspecified: Secondary | ICD-10-CM | POA: Insufficient documentation

## 2014-10-20 DIAGNOSIS — Z3202 Encounter for pregnancy test, result negative: Secondary | ICD-10-CM | POA: Insufficient documentation

## 2014-10-20 DIAGNOSIS — Z79899 Other long term (current) drug therapy: Secondary | ICD-10-CM | POA: Insufficient documentation

## 2014-10-20 LAB — URINALYSIS, ROUTINE W REFLEX MICROSCOPIC
Bilirubin Urine: NEGATIVE
Glucose, UA: NEGATIVE mg/dL
Hgb urine dipstick: NEGATIVE
KETONES UR: NEGATIVE mg/dL
LEUKOCYTES UA: NEGATIVE
Nitrite: NEGATIVE
PROTEIN: NEGATIVE mg/dL
Specific Gravity, Urine: 1.003 — ABNORMAL LOW (ref 1.005–1.030)
UROBILINOGEN UA: 0.2 mg/dL (ref 0.0–1.0)
pH: 7 (ref 5.0–8.0)

## 2014-10-20 LAB — PREGNANCY, URINE: Preg Test, Ur: NEGATIVE

## 2014-10-20 NOTE — ED Notes (Signed)
Per Pt. To PA the Pt. Reports no trouble eating or drinking and no trouble urinating or pooping.  Pt. Reports she is backing off on the detox pills she is taking due to poss. Cause of stomach upset.

## 2014-10-20 NOTE — ED Notes (Signed)
Back pain for a few days. Comes and goes.

## 2014-10-20 NOTE — ED Notes (Signed)
MD at bedside. 

## 2014-10-20 NOTE — Discharge Instructions (Signed)
You were evaluated in the ED today for your abdominal and back discomfort. There does not appear to be an emergent cause your symptoms at this time. You may follow-up with primary care for further evaluation management of your symptoms. Continue to take your Aleve as needed for your discomfort. Return to ED for new or worsening symptoms.

## 2014-10-20 NOTE — ED Provider Notes (Signed)
CSN: [id number]     Arrival date & time 10/20/14  1704 History   First MD Initiated Contact with Patient 10/20/14 1711     Chief Complaint  Patient presents with  . Back Pain     (Consider location/radiation/quality/duration/timing/severity/associated sxs/prior Treatment) HPI Ruth Conley is a 49 y.o. female who comes in for evaluation of abdominal and back discomfort. She reports she has had a moving, sharp, intermittent pain that will move from her back to her abdomen. This problem has been going on for the past week. She reports recently trying an herbal supplement, colon cleanse, to help detox before a weight loss program, and that's when this discomfort started. She denies any fevers, chills, numbness or weakness, personal history of cancer, steroid use, IV drug use. She denies nausea or vomiting, urinary symptoms. Last bowel movement was this morning and was normal for her. She denies any overt discomfort. She has been taking Aleve with some relief of her symptoms. No other aggravating or modifying factors.  Past Medical History  Diagnosis Date  . Hypertension    Past Surgical History  Procedure Laterality Date  . Cesarean section     No family history on file. History  Substance Use Topics  . Smoking status: Never Smoker   . Smokeless tobacco: Not on file  . Alcohol Use: No   OB History    No data available     Review of Systems A 10 point review of systems was completed and was negative except for pertinent positives and negatives as mentioned in the history of present illness     Allergies  Review of patient's allergies indicates no known allergies.  Home Medications   Prior to Admission medications   Medication Sig Start Date End Date Taking? Authorizing Provider  aspirin 325 MG tablet Take 1 tablet (325 mg total) by mouth daily. 08/31/14   Delfina Redwood, MD  Calcium Carb-Cholecalciferol (CALCIUM 1000 + D PO) Take 1 Applicatorful by mouth daily.     Historical Provider, MD  Coenzyme Q-10 100 MG capsule Take 100 mg by mouth daily.    Historical Provider, MD  ibuprofen (ADVIL,MOTRIN) 200 MG tablet Take 200 mg by mouth every 6 (six) hours as needed for mild pain.    Historical Provider, MD  metoprolol succinate (TOPROL XL) 25 MG 24 hr tablet Take 1 tablet (25 mg total) by mouth daily. 08/31/14   Delfina Redwood, MD  Omega 3 1200 MG CAPS Take 600 mg by mouth daily.    Historical Provider, MD  rosuvastatin (CRESTOR) 10 MG tablet Take 1 tablet (10 mg total) by mouth daily. 08/31/14   Delfina Redwood, MD   BP 153/94 mmHg  Pulse 93  Temp(Src) 98.6 F (37 C) (Oral)  Resp 16  Ht 5\' 7"  (1.702 m)  Wt 220 lb (99.791 kg)  BMI 34.45 kg/m2  SpO2 99%  LMP 10/06/2014 Physical Exam  Constitutional: She is oriented to person, place, and time. She appears well-developed and well-nourished.  HENT:  Head: Normocephalic and atraumatic.  Mouth/Throat: Oropharynx is clear and moist.  Eyes: Conjunctivae are normal. Pupils are equal, round, and reactive to light. Right eye exhibits no discharge. Left eye exhibits no discharge. No scleral icterus.  Neck: Neck supple.  Cardiovascular: Normal rate, regular rhythm and normal heart sounds.   Pulmonary/Chest: Effort normal and breath sounds normal. No respiratory distress. She has no wheezes. She has no rales.  Abdominal: Soft. There is no tenderness.  Musculoskeletal: Normal range  of motion. She exhibits no edema or tenderness.  Neurological: She is alert and oriented to person, place, and time.  Cranial Nerves II-XII grossly intact  Skin: Skin is warm and dry. No rash noted.  Psychiatric: She has a normal mood and affect.  Nursing note and vitals reviewed.   ED Course  Procedures (including critical care time) Labs Review Labs Reviewed  URINALYSIS, ROUTINE W REFLEX MICROSCOPIC (NOT AT [facility]) - Abnormal; Notable for the following:    Specific Gravity, Urine 1.003 (*)    All other components within  normal limits  PREGNANCY, URINE    Imaging Review No results found.   EKG Interpretation None     Meds given in ED:  Medications - No data to display  Discharge Medication List as of 10/20/2014  5:52 PM     Filed Vitals:   10/20/14 1712  BP: 153/94  Pulse: 93  Temp: 98.6 F (37 C)  TempSrc: Oral  Resp: 16  Height: 5\' 7"  (1.702 m)  Weight: 220 lb (99.791 kg)  SpO2: 99%    MDM  Vitals stable - WNL -afebrile Pt resting comfortably in ED. PE--physical exam is grossly benign. Urinalysis shows no evidence of UTI, pregnancy negative.  Patient discomfort does not appear related to an emergent source. Discuss cessation of supplementation until symptoms abate. Patient agrees with this plan. I discussed all relevant lab findings and imaging results with pt and they verbalized understanding. Discussed f/u with PCP within 48 hrs and return precautions, pt very amenable to plan.  Final diagnoses:  Abdominal discomfort  Discomfort of back        Comer Locket, PA-C 10/21/14 0100  Blanchie Dessert, MD 10/24/14 [phone]

## 2015-01-02 ENCOUNTER — Ambulatory Visit: Payer: Self-pay | Attending: Family Medicine

## 2015-01-28 ENCOUNTER — Emergency Department (INDEPENDENT_AMBULATORY_CARE_PROVIDER_SITE_OTHER)
Admission: EM | Admit: 2015-01-28 | Discharge: 2015-01-28 | Disposition: A | Discharge status: Home / Self Care | Payer: Self-pay | Source: Home / Self Care | Attending: Family Medicine | Admitting: Family Medicine

## 2015-01-28 ENCOUNTER — Encounter (HOSPITAL_COMMUNITY): Payer: Self-pay | Admitting: *Deleted

## 2015-01-28 DIAGNOSIS — I10 Essential (primary) hypertension: Secondary | ICD-10-CM

## 2015-01-28 LAB — POCT URINALYSIS DIP (DEVICE)
BILIRUBIN URINE: NEGATIVE
Glucose, UA: NEGATIVE mg/dL
KETONES UR: NEGATIVE mg/dL
Leukocytes, UA: NEGATIVE
Nitrite: NEGATIVE
Protein, ur: NEGATIVE mg/dL
SPECIFIC GRAVITY, URINE: 1.01 (ref 1.005–1.030)
Urobilinogen, UA: 0.2 mg/dL (ref 0.0–1.0)
pH: 7 (ref 5.0–8.0)

## 2015-01-28 LAB — POCT PREGNANCY, URINE: Preg Test, Ur: NEGATIVE

## 2015-01-28 MED ORDER — METOPROLOL SUCCINATE ER 25 MG PO TB24: 25.0000 mg | ORAL_TABLET | Freq: Every day | ORAL | Status: DC

## 2015-01-28 NOTE — ED Notes (Signed)
Pt  States  Was advised  By  [facility]    [place] to   Come  To  The  ucc to  Be evaul for        Blood pressure   Problems  As   Well  As  An   Abnormal  menstural   Cycle             She  States she  Has been  Taking  Her  Metoprolol  But  Will need  A  Refill

## 2015-01-28 NOTE — ED Provider Notes (Signed)
CSN: [id number]     Arrival date & time 01/28/15  1536 History   First MD Initiated Contact with Patient 01/28/15 1717     Chief Complaint  Patient presents with  . Hypertension   (Consider location/radiation/quality/duration/timing/severity/associated sxs/prior Treatment) Patient is a 49 y.o. female presenting with hypertension. The history is provided by the patient.  Hypertension This is a chronic problem. The current episode started more than 1 week ago. The problem has not changed (last pill today.) since onset.Pertinent negatives include no chest pain, no abdominal pain and no headaches.    Past Medical History  Diagnosis Date  . Hypertension    Past Surgical History  Procedure Laterality Date  . Cesarean section     History reviewed. No pertinent family history. Social History  Substance Use Topics  . Smoking status: Never Smoker   . Smokeless tobacco: None  . Alcohol Use: No   OB History    No data available     Review of Systems  Constitutional: Negative.   Respiratory: Negative.   Cardiovascular: Negative for chest pain, palpitations and leg swelling.  Gastrointestinal: Negative for abdominal pain.  Neurological: Negative for headaches.  All other systems reviewed and are negative.   Allergies  Review of patient's allergies indicates no known allergies.  Home Medications   Prior to Admission medications   Medication Sig Start Date End Date Taking? Authorizing Provider  aspirin 325 MG tablet Take 1 tablet (325 mg total) by mouth daily. 08/31/14   Delfina Redwood, MD  Calcium Carb-Cholecalciferol (CALCIUM 1000 + D PO) Take 1 Applicatorful by mouth daily.    Historical Provider, MD  Coenzyme Q-10 100 MG capsule Take 100 mg by mouth daily.    Historical Provider, MD  ibuprofen (ADVIL,MOTRIN) 200 MG tablet Take 200 mg by mouth every 6 (six) hours as needed for mild pain.    Historical Provider, MD  metoprolol succinate (TOPROL-XL) 25 MG 24 hr tablet Take 1  tablet (25 mg total) by mouth daily. 01/28/15   Billy Fischer, MD  Omega 3 1200 MG CAPS Take 600 mg by mouth daily.    Historical Provider, MD  rosuvastatin (CRESTOR) 10 MG tablet Take 1 tablet (10 mg total) by mouth daily. 08/31/14   Delfina Redwood, MD   Meds Ordered and Administered this Visit  Medications - No data to display  BP 164/96 mmHg  Pulse 88  Temp(Src) 98.4 F (36.9 C) (Oral)  Resp 16  SpO2 97%  LMP 01/28/2015 No data found.   Physical Exam  Constitutional: She is oriented to person, place, and time. She appears well-developed and well-nourished.  Neck: Normal range of motion. Neck supple.  Cardiovascular: Normal rate, regular rhythm, normal heart sounds and intact distal pulses.   Pulmonary/Chest: Effort normal and breath sounds normal.  Neurological: She is alert and oriented to person, place, and time.  Skin: Skin is warm and dry.  Nursing note and vitals reviewed.   ED Course  Procedures (including critical care time)  Labs Review Labs Reviewed  POCT URINALYSIS DIP (DEVICE) - Abnormal; Notable for the following:    Hgb urine dipstick LARGE (*)    All other components within normal limits  POCT PREGNANCY, URINE    Imaging Review No results found.   Visual Acuity Review  Right Eye Distance:   Left Eye Distance:   Bilateral Distance:    Right Eye Near:   Left Eye Near:    Bilateral Near:  MDM   1. Essential hypertension        Billy Fischer, MD 01/29/15 1600

## 2015-01-28 NOTE — Discharge Instructions (Signed)
See your doctor for further refills or medication adjustments

## 2015-02-02 ENCOUNTER — Encounter: Payer: Self-pay | Admitting: Internal Medicine

## 2015-02-02 ENCOUNTER — Ambulatory Visit: Payer: Self-pay | Attending: Internal Medicine | Admitting: Internal Medicine

## 2015-02-02 VITALS — BP 132/92 | HR 70 | Temp 98.0°F | Resp 16 | Ht 67.0 in | Wt 215.0 lb

## 2015-02-02 DIAGNOSIS — E785 Hyperlipidemia, unspecified: Secondary | ICD-10-CM

## 2015-02-02 DIAGNOSIS — Z0000 Encounter for general adult medical examination without abnormal findings: Secondary | ICD-10-CM

## 2015-02-02 DIAGNOSIS — N926 Irregular menstruation, unspecified: Secondary | ICD-10-CM

## 2015-02-02 DIAGNOSIS — I10 Essential (primary) hypertension: Secondary | ICD-10-CM

## 2015-02-02 LAB — BASIC METABOLIC PANEL
BUN: 10 mg/dL (ref 7–25)
CHLORIDE: 105 mmol/L (ref 98–110)
CO2: 29 mmol/L (ref 20–31)
Calcium: 9 mg/dL (ref 8.6–10.2)
Creat: 0.71 mg/dL (ref 0.50–1.10)
Glucose, Bld: 92 mg/dL (ref 65–99)
POTASSIUM: 4.1 mmol/L (ref 3.5–5.3)
Sodium: 141 mmol/L (ref 135–146)

## 2015-02-02 LAB — CBC
HCT: 31.7 % — ABNORMAL LOW (ref 36.0–46.0)
Hemoglobin: 9.9 g/dL — ABNORMAL LOW (ref 12.0–15.0)
MCH: 24.9 pg — ABNORMAL LOW (ref 26.0–34.0)
MCHC: 31.2 g/dL (ref 30.0–36.0)
MCV: 79.6 fL (ref 78.0–100.0)
MPV: 9.7 fL (ref 8.6–12.4)
PLATELETS: 396 10*3/uL (ref 150–400)
RBC: 3.98 MIL/uL (ref 3.87–5.11)
RDW: 18.1 % — AB (ref 11.5–15.5)
WBC: 5.3 10*3/uL (ref 4.0–10.5)

## 2015-02-02 MED ORDER — METOPROLOL SUCCINATE ER 25 MG PO TB24: 25.0000 mg | ORAL_TABLET | Freq: Every day | ORAL | Status: DC

## 2015-02-02 NOTE — Progress Notes (Signed)
Patient ID: Ruth Conley, female   DOB: [birth year], 49 y.o.   MRN: [id number]   AST:[id number]  LGX:[id number]  DOB - Jan 31, 1966  CC:  Chief Complaint  Patient presents with  . Establish Care       HPI: Ruth Conley is a 49 y.o. female here today to establish medical care. Patient has a past medical history of HTN, TIA (May 2016), HLD. Patient reports that she takes whenever she has a menstrual cycle her blood pressure elevates during that time. She states that she has been on her current cycle for the past 3 weeks which has been very heavy. All other cycles before this current has been normal. She states that she has been feeling fatigued and cold so she began taking iron tablets last week. Last pap was in May which was normal. Last mammogram this year.   No Known Allergies Past Medical History  Diagnosis Date  . Hypertension    Current Outpatient Prescriptions on File Prior to Visit  Medication Sig Dispense Refill  . aspirin 325 MG tablet Take 1 tablet (325 mg total) by mouth daily.    . Calcium Carb-Cholecalciferol (CALCIUM 1000 + D PO) Take 1 Applicatorful by mouth daily.    . Coenzyme Q-10 100 MG capsule Take 100 mg by mouth daily.    Marland Kitchen ibuprofen (ADVIL,MOTRIN) 200 MG tablet Take 200 mg by mouth every 6 (six) hours as needed for mild pain.    . metoprolol succinate (TOPROL-XL) 25 MG 24 hr tablet Take 1 tablet (25 mg total) by mouth daily. 30 tablet 1  . Omega 3 1200 MG CAPS Take 600 mg by mouth daily.    . rosuvastatin (CRESTOR) 10 MG tablet Take 1 tablet (10 mg total) by mouth daily. (Patient not taking: Reported on 02/02/2015) 30 tablet 1   No current facility-administered medications on file prior to visit.   History reviewed. No pertinent family history. Social History   Social History  . Marital Status: Legally Separated    Spouse Name: N/A  . Number of Children: N/A  . Years of Education: N/A   Occupational History  . Not on file.   Social History Main  Topics  . Smoking status: Never Smoker   . Smokeless tobacco: Not on file  . Alcohol Use: No  . Drug Use: No  . Sexual Activity: Not on file   Other Topics Concern  . Not on file   Social History Narrative    Review of Systems  Eyes: Negative for blurred vision.  Respiratory: Negative for cough and shortness of breath.   Cardiovascular: Negative for chest pain, palpitations and leg swelling.  Neurological: Positive for headaches (only when BP is elevated). Negative for dizziness and tingling.      Objective:   Filed Vitals:   02/02/15 1120  BP: 132/92  Pulse: 70  Temp: 98 F (36.7 C)  Resp: 16    Physical Exam: Constitutional: Patient appears well-developed and well-nourished. No distress. HENT: Normocephalic, atraumatic, External right and left ear normal. Oropharynx is clear and moist.  Eyes: Conjunctivae and EOM are normal. PERRLA, no scleral icterus. Neck: Normal ROM. Neck supple. No JVD. No tracheal deviation. No thyromegaly. CVS: RRR, S1/S2 +, no murmurs, no gallops, no carotid bruit.  Pulmonary: Effort and breath sounds normal, no stridor, rhonchi, wheezes, rales.  Abdominal: Soft. BS +, no distension, tenderness, rebound or guarding.  Musculoskeletal: Normal range of motion. No edema and no tenderness.  Lymphadenopathy: No lymphadenopathy noted, cervical, inguinal or  axillary Neuro: Alert. Normal reflexes, muscle tone coordination. No cranial nerve deficit. Skin: Skin is warm and dry. No rash noted. Not diaphoretic. No erythema. No pallor. Psychiatric: Normal mood and affect. Behavior, judgment, thought content normal.  Lab Results  Component Value Date   WBC 8.3 08/29/2014   HGB 10.5* 08/29/2014   HCT 32.5* 08/29/2014   MCV 80.2 08/29/2014   PLT 391 08/29/2014   Lab Results  Component Value Date   CREATININE 0.74 08/29/2014   BUN 9 08/29/2014   NA 137 08/29/2014   K 3.3* 08/29/2014   CL 105 08/29/2014   CO2 27 08/29/2014    Lab Results    Component Value Date   HGBA1C 6.0* 08/30/2014   Lipid Panel     Component Value Date/Time   CHOL 183 08/30/2014 0512   TRIG 67 08/30/2014 0512   HDL 56 08/30/2014 0512   CHOLHDL 3.3 08/30/2014 0512   VLDL 13 08/30/2014 0512   LDLCALC 114* 08/30/2014 0512       Assessment and plan:   Ritaj was seen today for establish care.  Diagnoses and all orders for this visit:  Essential hypertension -     Basic Metabolic Panel Continue Metoprolol. BP is elevated in office today, will have her come back in 3 weeks for a BP recheck, if still elevated I will make changes at that time. DASH diet advised.  Irregular menstrual cycle -     Vitamin D, 25-hydroxy -     CBC Patient likely transitioning into Menopause   Healthcare maintenance -     Flu Vaccine QUAD 36+ mos PF IM (Fluarix & Fluzone Quad PF)  HLD (hyperlipidemia) Patient reports that she never took Crestor due to price. She has only been using Fish oil. I will recheck lipid panel in November---if elevated I will start her on Simvastatin Continue aspirin therapy   Return for NOV 1st-Lab Visit and RN-BP check and 3 mo PCP .     Lance Bosch, [facility] [phone] 02/02/2015, 11:24 AM

## 2015-02-02 NOTE — Progress Notes (Signed)
Patient here to establish care Currently taking medications for blood pressure But states her blood pressure has been running high

## 2015-02-02 NOTE — Patient Instructions (Signed)
DASH Eating Plan °DASH stands for "Dietary Approaches to Stop Hypertension." The DASH eating plan is a healthy eating plan that has been shown to reduce high blood pressure (hypertension). Additional health benefits may include reducing the risk of type 2 diabetes mellitus, heart disease, and stroke. The DASH eating plan may also help with weight loss. °WHAT DO I NEED TO KNOW ABOUT THE DASH EATING PLAN? °For the DASH eating plan, you will follow these general guidelines: °· Choose foods with a percent daily value for sodium of less than 5% (as listed on the food label). °· Use salt-free seasonings or herbs instead of table salt or sea salt. °· Check with your health care provider or pharmacist before using salt substitutes. °· Eat lower-sodium products, often labeled as "lower sodium" or "no salt added." °· Eat fresh foods. °· Eat more vegetables, fruits, and low-fat dairy products. °· Choose whole grains. Look for the word "whole" as the first word in the ingredient list. °· Choose fish and skinless chicken or turkey more often than red meat. Limit fish, poultry, and meat to 6 oz (170 g) each day. °· Limit sweets, desserts, sugars, and sugary drinks. °· Choose heart-healthy fats. °· Limit cheese to 1 oz (28 g) per day. °· Eat more home-cooked food and less restaurant, buffet, and fast food. °· Limit fried foods. °· Cook foods using methods other than frying. °· Limit canned vegetables. If you do use them, rinse them well to decrease the sodium. °· When eating at a restaurant, ask that your food be prepared with less salt, or no salt if possible. °WHAT FOODS CAN I EAT? °Seek help from a dietitian for individual calorie needs. °Grains °Whole grain or whole wheat bread. Brown rice. Whole grain or whole wheat pasta. Quinoa, bulgur, and whole grain cereals. Low-sodium cereals. Corn or whole wheat flour tortillas. Whole grain cornbread. Whole grain crackers. Low-sodium crackers. °Vegetables °Fresh or frozen vegetables  (raw, steamed, roasted, or grilled). Low-sodium or reduced-sodium tomato and vegetable juices. Low-sodium or reduced-sodium tomato sauce and paste. Low-sodium or reduced-sodium canned vegetables.  °Fruits °All fresh, canned (in natural juice), or frozen fruits. °Meat and Other Protein Products °Ground beef (85% or leaner), grass-fed beef, or beef trimmed of fat. Skinless chicken or turkey. Ground chicken or turkey. Pork trimmed of fat. All fish and seafood. Eggs. Dried beans, peas, or lentils. Unsalted nuts and seeds. Unsalted canned beans. °Dairy °Low-fat dairy products, such as skim or 1% milk, 2% or reduced-fat cheeses, low-fat ricotta or cottage cheese, or plain low-fat yogurt. Low-sodium or reduced-sodium cheeses. °Fats and Oils °Tub margarines without trans fats. Light or reduced-fat mayonnaise and salad dressings (reduced sodium). Avocado. Safflower, olive, or canola oils. Natural peanut or almond butter. °Other °Unsalted popcorn and pretzels. °The items listed above may not be a complete list of recommended foods or beverages. Contact your dietitian for more options. °WHAT FOODS ARE NOT RECOMMENDED? °Grains °White bread. White pasta. White rice. Refined cornbread. Bagels and croissants. Crackers that contain trans fat. °Vegetables °Creamed or fried vegetables. Vegetables in a cheese sauce. Regular canned vegetables. Regular canned tomato sauce and paste. Regular tomato and vegetable juices. °Fruits °Dried fruits. Canned fruit in light or heavy syrup. Fruit juice. °Meat and Other Protein Products °Fatty cuts of meat. Ribs, chicken wings, bacon, sausage, bologna, salami, chitterlings, fatback, hot dogs, bratwurst, and packaged luncheon meats. Salted nuts and seeds. Canned beans with salt. °Dairy °Whole or 2% milk, cream, half-and-half, and cream cheese. Whole-fat or sweetened yogurt. Full-fat   cheeses or blue cheese. Nondairy creamers and whipped toppings. Processed cheese, cheese spreads, or cheese  curds. °Condiments °Onion and garlic salt, seasoned salt, table salt, and sea salt. Canned and packaged gravies. Worcestershire sauce. Tartar sauce. Barbecue sauce. Teriyaki sauce. Soy sauce, including reduced sodium. Steak sauce. Fish sauce. Oyster sauce. Cocktail sauce. Horseradish. Ketchup and mustard. Meat flavorings and tenderizers. Bouillon cubes. Hot sauce. Tabasco sauce. Marinades. Taco seasonings. Relishes. °Fats and Oils °Butter, stick margarine, lard, shortening, ghee, and bacon fat. Coconut, palm kernel, or palm oils. Regular salad dressings. °Other °Pickles and olives. Salted popcorn and pretzels. °The items listed above may not be a complete list of foods and beverages to avoid. Contact your dietitian for more information. °WHERE CAN I FIND MORE INFORMATION? °National Heart, [employer], and [employer] Institute: www.nhlbi.nih.gov/health/health-topics/topics/dash/ °Document Released: 04/07/2011 Document Revised: 09/02/2013 Document Reviewed: 02/20/2013 °Exit[employer]® Patient Information ©2015 [employer]. This information is not intended to replace advice given to you by your health care provider. Make sure you discuss any questions you have with your health care provider. ° °

## 2015-02-03 LAB — VITAMIN D 25 HYDROXY (VIT D DEFICIENCY, FRACTURES): Vit D, 25-Hydroxy: 35 ng/mL (ref 30–100)

## 2015-02-16 ENCOUNTER — Telehealth: Payer: Self-pay

## 2015-02-16 MED ORDER — FERROUS SULFATE 325 (65 FE) MG PO TABS: 325.0000 mg | ORAL_TABLET | Freq: Every day | ORAL | Status: DC

## 2015-02-16 NOTE — Telephone Encounter (Signed)
Spoke with patient and she is aware of her lab results [facility]

## 2015-02-16 NOTE — Telephone Encounter (Signed)
-----   Message from Lance Bosch, NP sent at 02/13/2015 12:43 PM EDT ----- Anemic from blood loss during menstrual. Please send Ferrous Sulfate 325 mg once daily this will help with anemia, fatigue, and feeling cold.

## 2015-03-03 ENCOUNTER — Encounter: Payer: Self-pay | Admitting: Pharmacist

## 2015-03-09 ENCOUNTER — Telehealth: Payer: Self-pay | Admitting: Internal Medicine

## 2015-03-09 DIAGNOSIS — N921 Excessive and frequent menstruation with irregular cycle: Secondary | ICD-10-CM

## 2015-03-09 NOTE — Telephone Encounter (Signed)
Patient is having heavy menstrual periods and is hoping to be referred to a gynecologist asap. Patient last saw [facility] in October. Please follow up with pt if we are able to refer her without her being seen by provider again. She has the cone discount and the orange card. Thank you.

## 2015-03-10 ENCOUNTER — Ambulatory Visit: Payer: Self-pay | Attending: Internal Medicine | Admitting: Pharmacist

## 2015-03-10 ENCOUNTER — Encounter: Payer: Self-pay | Admitting: Pharmacist

## 2015-03-10 ENCOUNTER — Telehealth: Payer: Self-pay

## 2015-03-10 VITALS — BP 132/82 | HR 66

## 2015-03-10 DIAGNOSIS — I10 Essential (primary) hypertension: Secondary | ICD-10-CM | POA: Insufficient documentation

## 2015-03-10 DIAGNOSIS — Z79899 Other long term (current) drug therapy: Secondary | ICD-10-CM | POA: Insufficient documentation

## 2015-03-10 NOTE — Telephone Encounter (Signed)
Patient returned phone call and is aware a referral to ob-gyn was placed in epic

## 2015-03-10 NOTE — Patient Instructions (Addendum)
Thank you for coming to see me today!  Your blood pressure looks much better. We will keep an eye on it though.  If you see any blood pressures >140/90, please let us know

## 2015-03-10 NOTE — Telephone Encounter (Signed)
Returned patient phone call Patient not available Left message on voice mail to return our call 

## 2015-03-10 NOTE — Progress Notes (Signed)
S:    Patient arrives in good spirits. Presents to the clinic for hypertension evaluation.   Patient reports adherence with medications.  Current BP Medications include:  Metoprolol succinate 25 mg daily  Antihypertensives tried in the past include: none  Patient reports that she is very stressed about her menstrual cycle and heavy bleeding. She would like a referral to gynecology. She reports that the bleeding really scares her and stresses her out and she thinks that this is what is increasing her blood pressure.    O:   Last 3 Office BP readings: BP Readings from Last 3 Encounters:  03/10/15 132/82  02/02/15 132/92  01/28/15 164/96    BMET    Component Value Date/Time   NA 141 02/02/2015 1152   K 4.1 02/02/2015 1152   CL 105 02/02/2015 1152   CO2 29 02/02/2015 1152   GLUCOSE 92 02/02/2015 1152   BUN 10 02/02/2015 1152   CREATININE 0.71 02/02/2015 1152   CREATININE 0.74 08/29/2014 2250   CALCIUM 9.0 02/02/2015 1152   GFRNONAA >90 08/29/2014 2250   GFRAA >90 08/29/2014 2250    A/P: History of hypertension currently controlled on current medications.  Continue metoprolol succinate 50 mg daily. Instructed patient to let us know if she has any blood pressure readings >140/90. Will also send message to Chari Manning, NP, concerning referral to gynecology.   Of note, due to the heavy menstrual bleeding, I would consider decreasing the dose of aspirin to 81 mg daily. For secondary stroke/TIA prevention, the recommended dose is 81 mg daily and a dose of 325 mg increases the risk of bleeding without providing increased effectiveness. Will defer change to primary care provider.  Results reviewed and written information provided. Total time in face-to-face counseling 20 minutes.   F/U Clinic Visit with Chari Manning, NP, as directed.

## 2015-03-11 ENCOUNTER — Encounter: Payer: Self-pay | Admitting: Obstetrics and Gynecology

## 2015-03-25 ENCOUNTER — Ambulatory Visit: Payer: Self-pay | Admitting: Internal Medicine

## 2015-04-02 DIAGNOSIS — I639 Cerebral infarction, unspecified: Secondary | ICD-10-CM

## 2015-04-02 HISTORY — PX: TRANSTHORACIC ECHOCARDIOGRAM: SHX275

## 2015-04-02 HISTORY — DX: Cerebral infarction, unspecified: I63.9

## 2015-04-03 ENCOUNTER — Encounter: Payer: Self-pay | Admitting: Internal Medicine

## 2015-04-03 ENCOUNTER — Ambulatory Visit: Payer: No Typology Code available for payment source | Attending: Internal Medicine | Admitting: Internal Medicine

## 2015-04-03 VITALS — BP 128/88 | HR 85 | Temp 98.0°F | Resp 17 | Ht 67.0 in | Wt 225.0 lb

## 2015-04-03 DIAGNOSIS — Z7982 Long term (current) use of aspirin: Secondary | ICD-10-CM | POA: Insufficient documentation

## 2015-04-03 DIAGNOSIS — Z79899 Other long term (current) drug therapy: Secondary | ICD-10-CM | POA: Insufficient documentation

## 2015-04-03 DIAGNOSIS — E7800 Pure hypercholesterolemia, unspecified: Secondary | ICD-10-CM | POA: Insufficient documentation

## 2015-04-03 DIAGNOSIS — I10 Essential (primary) hypertension: Secondary | ICD-10-CM

## 2015-04-03 DIAGNOSIS — N938 Other specified abnormal uterine and vaginal bleeding: Secondary | ICD-10-CM | POA: Insufficient documentation

## 2015-04-03 DIAGNOSIS — D508 Other iron deficiency anemias: Secondary | ICD-10-CM

## 2015-04-03 LAB — CBC
HCT: 33.4 % — ABNORMAL LOW (ref 36.0–46.0)
Hemoglobin: 10.9 g/dL — ABNORMAL LOW (ref 12.0–15.0)
MCH: 26.7 pg (ref 26.0–34.0)
MCHC: 32.6 g/dL (ref 30.0–36.0)
MCV: 81.7 fL (ref 78.0–100.0)
MPV: 9.4 fL (ref 8.6–12.4)
Platelets: 339 10*3/uL (ref 150–400)
RBC: 4.09 MIL/uL (ref 3.87–5.11)
RDW: 17.1 % — ABNORMAL HIGH (ref 11.5–15.5)
WBC: 5 10*3/uL (ref 4.0–10.5)

## 2015-04-03 LAB — LIPID PANEL
CHOL/HDL RATIO: 3.1 ratio (ref <=5.0)
Cholesterol: 181 mg/dL (ref 125–200)
HDL: 59 mg/dL (ref >=46)
LDL Cholesterol: 109 mg/dL (ref <130)
Triglycerides: 63 mg/dL (ref <150)
VLDL: 13 mg/dL (ref <30)

## 2015-04-03 MED ORDER — METOPROLOL SUCCINATE ER 25 MG PO TB24: 25.0000 mg | ORAL_TABLET | Freq: Every day | ORAL | Status: DC

## 2015-04-03 NOTE — Progress Notes (Signed)
Patient states she is here for follow up on her HTN and for  Blood work to check her cholesterol and iron level Patient has already received the flu vaccine

## 2015-04-03 NOTE — Progress Notes (Signed)
Patient ID: Ruth Conley, female   DOB: April 18, 1966, 49 y.o.   MRN: [id number]  CC: follow up  HPI: Ruth Conley is a 49 y.o. female here today for a follow up visit.  Patient has past medical history of hypertension and dysfunctional uterine bleeding. Patient reports that since out last visit she had a menstrual cycle that lasted 3 weeks and was very heavy. She now has a GYN appointment scheduled for December 23rd. She has continued to take iron supplements daily.  She would like her cholesterol checked today. She has been taking Fish oil daily to help with her cholesterol. She does not want to start on cholesterol medication.   No Known Allergies Past Medical History  Diagnosis Date  . Hypertension    Current Outpatient Prescriptions on File Prior to Visit  Medication Sig Dispense Refill  . aspirin 325 MG tablet Take 1 tablet (325 mg total) by mouth daily.    . Calcium Carb-Cholecalciferol (CALCIUM 1000 + D PO) Take 1 Applicatorful by mouth daily.    . Coenzyme Q-10 100 MG capsule Take 100 mg by mouth daily.    . ferrous sulfate 325 (65 FE) MG tablet Take 1 tablet (325 mg total) by mouth daily with breakfast. 30 tablet 3  . metoprolol succinate (TOPROL-XL) 25 MG 24 hr tablet Take 1 tablet (25 mg total) by mouth daily. 30 tablet 3  . Omega 3 1200 MG CAPS Take 600 mg by mouth daily.    Marland Kitchen ibuprofen (ADVIL,MOTRIN) 200 MG tablet Take 200 mg by mouth every 6 (six) hours as needed for mild pain.     No current facility-administered medications on file prior to visit.   History reviewed. No pertinent family history. Social History   Social History  . Marital Status: Legally Separated    Spouse Name: N/A  . Number of Children: N/A  . Years of Education: N/A   Occupational History  . Not on file.   Social History Main Topics  . Smoking status: Never Smoker   . Smokeless tobacco: Not on file  . Alcohol Use: No  . Drug Use: No  . Sexual Activity: Not on file   Other Topics  Concern  . Not on file   Social History Narrative    Review of Systems: Other than what is stated in HPI, all other systems are negative.   Objective:   Filed Vitals:   04/03/15 1005  BP: 128/88  Pulse: 85  Temp: 98 F (36.7 C)  Resp: 17    Physical Exam  Constitutional: She is oriented to person, place, and time.  Cardiovascular: Normal rate, regular rhythm and normal heart sounds.   Pulmonary/Chest: Effort normal and breath sounds normal.  Neurological: She is alert and oriented to person, place, and time.     Lab Results  Component Value Date   WBC 5.3 02/02/2015   HGB 9.9* 02/02/2015   HCT 31.7* 02/02/2015   MCV 79.6 02/02/2015   PLT 396 02/02/2015   Lab Results  Component Value Date   CREATININE 0.71 02/02/2015   BUN 10 02/02/2015   NA 141 02/02/2015   K 4.1 02/02/2015   CL 105 02/02/2015   CO2 29 02/02/2015    Lab Results  Component Value Date   HGBA1C 6.0* 08/30/2014   Lipid Panel     Component Value Date/Time   CHOL 183 08/30/2014 0512   TRIG 67 08/30/2014 0512   HDL 56 08/30/2014 0512   CHOLHDL 3.3 08/30/2014 [id number]  VLDL 13 08/30/2014 0512   LDLCALC 114* 08/30/2014 0512       Assessment and plan:   Ruth Conley was seen today for follow-up.  Diagnoses and all orders for this visit:  Other iron deficiency anemias -     CBC I will recheck levels today.   DUB (dysfunctional uterine bleeding) Will likely improve after patient has proper GYN follow up. I suspect she will likely get a IUD to help with DUB.  Elevated LDL cholesterol level -     Lipid panel Will check today. Advised weight loss and exercise.  Return in about 6 months (around 10/02/2015) for Hypertension.       Lance Bosch, [facility] [phone] 04/03/2015, 10:51 AM

## 2015-04-05 ENCOUNTER — Encounter (HOSPITAL_COMMUNITY): Payer: Self-pay | Admitting: Emergency Medicine

## 2015-04-05 ENCOUNTER — Inpatient Hospital Stay (HOSPITAL_COMMUNITY)
Admission: EM | Admit: 2015-04-05 | Discharge: 2015-04-06 | DRG: 066 | Disposition: A | Discharge status: Home / Self Care | Payer: Self-pay | Attending: Internal Medicine | Admitting: Internal Medicine

## 2015-04-05 ENCOUNTER — Emergency Department (HOSPITAL_COMMUNITY): Payer: Self-pay

## 2015-04-05 DIAGNOSIS — R202 Paresthesia of skin: Secondary | ICD-10-CM | POA: Diagnosis present

## 2015-04-05 DIAGNOSIS — R402252 Coma scale, best verbal response, oriented, at arrival to emergency department: Secondary | ICD-10-CM | POA: Diagnosis present

## 2015-04-05 DIAGNOSIS — D509 Iron deficiency anemia, unspecified: Secondary | ICD-10-CM | POA: Diagnosis present

## 2015-04-05 DIAGNOSIS — R200 Anesthesia of skin: Secondary | ICD-10-CM

## 2015-04-05 DIAGNOSIS — Z8673 Personal history of transient ischemic attack (TIA), and cerebral infarction without residual deficits: Secondary | ICD-10-CM

## 2015-04-05 DIAGNOSIS — I639 Cerebral infarction, unspecified: Principal | ICD-10-CM | POA: Diagnosis present

## 2015-04-05 DIAGNOSIS — R29700 NIHSS score 0: Secondary | ICD-10-CM | POA: Diagnosis present

## 2015-04-05 DIAGNOSIS — I10 Essential (primary) hypertension: Secondary | ICD-10-CM | POA: Diagnosis present

## 2015-04-05 DIAGNOSIS — R402142 Coma scale, eyes open, spontaneous, at arrival to emergency department: Secondary | ICD-10-CM | POA: Diagnosis present

## 2015-04-05 DIAGNOSIS — E785 Hyperlipidemia, unspecified: Secondary | ICD-10-CM | POA: Diagnosis present

## 2015-04-05 DIAGNOSIS — R402362 Coma scale, best motor response, obeys commands, at arrival to emergency department: Secondary | ICD-10-CM | POA: Diagnosis present

## 2015-04-05 DIAGNOSIS — Z8632 Personal history of gestational diabetes: Secondary | ICD-10-CM

## 2015-04-05 HISTORY — DX: Transient cerebral ischemic attack, unspecified: G45.9

## 2015-04-05 LAB — BASIC METABOLIC PANEL
ANION GAP: 7 (ref 5–15)
BUN: 8 mg/dL (ref 6–20)
CHLORIDE: 106 mmol/L (ref 101–111)
CO2: 24 mmol/L (ref 22–32)
Calcium: 8.8 mg/dL — ABNORMAL LOW (ref 8.9–10.3)
Creatinine, Ser: 0.79 mg/dL (ref 0.44–1.00)
GFR calc Af Amer: >60 mL/min (ref >60)
Glucose, Bld: 133 mg/dL — ABNORMAL HIGH (ref 65–99)
Potassium: 3.5 mmol/L (ref 3.5–5.1)
SODIUM: 137 mmol/L (ref 135–145)

## 2015-04-05 LAB — CBC WITH DIFFERENTIAL/PLATELET
BASOS ABS: 0 10*3/uL (ref 0.0–0.1)
Basophils Relative: 0 %
EOS PCT: 1 %
Eosinophils Absolute: 0.1 10*3/uL (ref 0.0–0.7)
HCT: 33.6 % — ABNORMAL LOW (ref 36.0–46.0)
HEMOGLOBIN: 10.9 g/dL — AB (ref 12.0–15.0)
Lymphocytes Relative: 20 %
Lymphs Abs: 1.4 10*3/uL (ref 0.7–4.0)
MCH: 27 pg (ref 26.0–34.0)
MCHC: 32.4 g/dL (ref 30.0–36.0)
MCV: 83.4 fL (ref 78.0–100.0)
Monocytes Absolute: 0.4 10*3/uL (ref 0.1–1.0)
Monocytes Relative: 6 %
NEUTROS ABS: 5.2 10*3/uL (ref 1.7–7.7)
Neutrophils Relative %: 73 %
PLATELETS: 349 10*3/uL (ref 150–400)
RBC: 4.03 MIL/uL (ref 3.87–5.11)
RDW: 15.8 % — ABNORMAL HIGH (ref 11.5–15.5)
WBC: 7.2 10*3/uL (ref 4.0–10.5)

## 2015-04-05 LAB — I-STAT TROPONIN, ED: TROPONIN I, POC: 0.01 ng/mL (ref 0.00–0.08)

## 2015-04-05 MED ORDER — SODIUM CHLORIDE 0.9 % IV BOLUS (SEPSIS)
1000.0000 mL | Freq: Once | INTRAVENOUS | Status: AC
  Administered 2015-04-05: 1000 mL via INTRAVENOUS

## 2015-04-05 MED ORDER — SODIUM CHLORIDE 0.9 % IV BOLUS (SEPSIS)
1000.0000 mL | Freq: Once | INTRAVENOUS | Status: AC
  Administered 2015-04-06: 1000 mL via INTRAVENOUS

## 2015-04-05 MED ORDER — METOPROLOL TARTRATE 25 MG PO TABS
25.0000 mg | ORAL_TABLET | Freq: Once | ORAL | Status: AC
  Administered 2015-04-06: 25 mg via ORAL
  Filled 2015-04-05: qty 1

## 2015-04-05 NOTE — ED Notes (Signed)
Pt returned from xray

## 2015-04-05 NOTE — ED Provider Notes (Signed)
CSN: [id number]     Arrival date & time 04/05/15  2144 History   First MD Initiated Contact with Patient 04/05/15 2206     Chief Complaint  Patient presents with  . Numbness    Patient is a 49 y.o. female presenting with neurologic complaint. The history is provided by the patient.  Neurologic Problem This is a new problem. The current episode started today. The problem occurs constantly. The problem has been gradually improving. Associated symptoms include numbness and weakness. Pertinent negatives include no abdominal pain, chest pain, fever, headaches, nausea, neck pain, rash, sore throat or vomiting. Nothing aggravates the symptoms. She has tried nothing for the symptoms.    Past Medical History  Diagnosis Date  . Hypertension   . Gestational diabetes   . TIA (transient ischemic attack)    Past Surgical History  Procedure Laterality Date  . Cesarean section     No family history on file. Social History  Substance Use Topics  . Smoking status: Never Smoker   . Smokeless tobacco: None  . Alcohol Use: No   OB History    No data available     Review of Systems  Constitutional: Negative for fever.  HENT: Negative for rhinorrhea and sore throat.   Eyes: Negative for visual disturbance.  Respiratory: Negative for chest tightness and shortness of breath.   Cardiovascular: Negative for chest pain and palpitations.  Gastrointestinal: Negative for nausea, vomiting, abdominal pain and constipation.  Genitourinary: Negative for dysuria and hematuria.  Musculoskeletal: Negative for back pain and neck pain.  Skin: Negative for rash.  Neurological: Positive for weakness and numbness. Negative for facial asymmetry, speech difficulty and headaches.  Psychiatric/Behavioral: Negative for confusion.  All other systems reviewed and are negative.  Allergies  Review of patient's allergies indicates no known allergies.  Home Medications   Prior to Admission medications   Medication  Sig Start Date End Date Taking? Authorizing Provider  aspirin 325 MG tablet Take 1 tablet (325 mg total) by mouth daily. 08/31/14  Yes Delfina Redwood, MD  Coenzyme Q-10 100 MG capsule Take 100 mg by mouth daily.   Yes Historical Provider, MD  ferrous sulfate 325 (65 FE) MG tablet Take 1 tablet (325 mg total) by mouth daily with breakfast. 02/16/15  Yes Lance Bosch, NP  metoprolol succinate (TOPROL-XL) 25 MG 24 hr tablet Take 1 tablet (25 mg total) by mouth daily. 04/03/15  Yes Lance Bosch, NP  Omega 3 1200 MG CAPS Take 600 mg by mouth daily.   Yes Historical Provider, MD  Red Yeast Rice Extract (RED YEAST RICE PO) Take 1 capsule by mouth daily.   Yes Historical Provider, MD   BP 167/100 mmHg  Pulse 109  Temp(Src) 99.9 F (37.7 C) (Oral)  Resp 14  Ht 5\' 7"  (1.702 m)  Wt 101.606 kg  BMI 35.08 kg/m2  SpO2 99%  LMP 04/04/2015 Physical Exam  Constitutional: She is oriented to person, place, and time. She appears well-developed and well-nourished. No distress.  HENT:  Head: Normocephalic and atraumatic.  Mouth/Throat: Oropharynx is clear and moist.  Eyes: EOM are normal. Pupils are equal, round, and reactive to light.  Neck: Neck supple. No JVD present.  Cardiovascular: Normal rate, regular rhythm, normal heart sounds and intact distal pulses.  Exam reveals no gallop.   No murmur heard. Pulmonary/Chest: Effort normal and breath sounds normal. She has no wheezes. She has no rales.  Abdominal: Soft. She exhibits no distension. There is no  tenderness.  Musculoskeletal: Normal range of motion. She exhibits no tenderness.  Neurological: She is alert and oriented to person, place, and time. She displays no tremor. No cranial nerve deficit or sensory deficit. She exhibits normal muscle tone. She displays no seizure activity. Coordination normal. GCS eye subscore is 4. GCS verbal subscore is 5. GCS motor subscore is 6.  Reflex Scores:      Bicep reflexes are 2+ on the right side and 2+ on  the left side.      Brachioradialis reflexes are 2+ on the right side and 2+ on the left side.      Patellar reflexes are 2+ on the right side and 2+ on the left side. Normal finger-nose, no dysdiadochokinesia  Skin: Skin is warm and dry. No rash noted.  Psychiatric: Her behavior is normal.    ED Course  Procedures  None   Labs Review Labs Reviewed  CBC WITH DIFFERENTIAL/PLATELET - Abnormal; Notable for the following:    Hemoglobin 10.9 (*)    HCT 33.6 (*)    RDW 15.8 (*)    All other components within normal limits  BASIC METABOLIC PANEL - Abnormal; Notable for the following:    Glucose, Bld 133 (*)    Calcium 8.8 (*)    All other components within normal limits  I-STAT TROPOININ, ED    Imaging Review Dg Chest 2 View  04/05/2015  CLINICAL DATA:  Mild left foot, left finger and left lip numbness. Initial encounter. EXAM: CHEST  2 VIEW COMPARISON:  Chest radiograph from 04/09/2013 FINDINGS: The lungs are well-aerated and clear. There is no evidence of focal opacification, pleural effusion or pneumothorax. The heart is normal in size; the mediastinal contour is within normal limits. No acute osseous abnormalities are seen. IMPRESSION: No acute cardiopulmonary process seen. Electronically Signed   By: Garald Balding M.D.   On: 04/05/2015 22:35   Ct Head Wo Contrast  04/05/2015  CLINICAL DATA:  49 year old female with mild numbness of the left foot and the left fingers. EXAM: CT HEAD WITHOUT CONTRAST TECHNIQUE: Contiguous axial images were obtained from the base of the skull through the vertex without intravenous contrast. COMPARISON:  Brain MRI dated 08/30/2014 FINDINGS: The ventricles and the sulci are appropriate in size for the patient's age. There is no intracranial hemorrhage. No midline shift or mass effect identified. The gray-white matter differentiation is preserved. The visualized paranasal sinuses and mastoid air cells are well aerated. The calvarium is intact. IMPRESSION: No  acute intracranial pathology. Electronically Signed   By: Anner Crete M.D.   On: 04/05/2015 23:06   I have personally reviewed and evaluated these images and lab results as part of my medical decision-making.   EKG Interpretation   Date/Time:  Sunday April 05 2015 22:18:41 EST Ventricular Rate:  123 PR Interval:  142 QRS Duration: 134 QT Interval:  341 QTC Calculation: 488 R Axis:   12 Text Interpretation:  Sinus tachycardia Atrial premature complexes  Nonspecific intraventricular conduction delay Borderline repolarization  abnormality No significant change since last tracing Confirmed by FLOYD  MD, Quillian Quince [id number]) on 04/05/2015 11:27:38 PM      MDM   Final diagnoses:  Left sided numbness   49 yo F with a PMH of TIA and HTN presents with left lip, left hand and left foot numbness onset approx 1 hour PTA. Denies headache, chest pain, SOB, lightheadness, neck pain, syncope, cough, nausea, emesis. Neuro exam nonfocal. Speech fluent. CT head negative for acute findings. Feel  she needs MRI to further evaluate symptoms. Patient also tachycardic and take metoprolol for this. Previous EKGs show sinus tachycardia, no new findings on today;s EKG to suggest right heart strain or acute ischemia. Patient without symptoms to suggest PE or ACS. IVFs given. Will also give home dose of metoprolol.  Care assumed by Dr. Sabra Heck at [place] pending.   Discussed with Dr. Tyrone Nine.    Discussed with Dr. Tyrone Nine.  Gustavus Bryant, MD 04/06/15 0002  Deno Etienne, DO 04/07/15 [place], DO 04/07/15 [id number]

## 2015-04-05 NOTE — ED Notes (Signed)
Pt. reports mild numbness at left foot , left fingers and left lip onset this evening ( 8 pm) , alert and oriented at arrival , speech clear , no facial asymmetry , equal strong grips with no arm drift/ambulatory. Denies pain /respirations unlabored .

## 2015-04-05 NOTE — ED Notes (Signed)
Patient currently in MRI.

## 2015-04-06 ENCOUNTER — Inpatient Hospital Stay (HOSPITAL_COMMUNITY): Payer: Self-pay

## 2015-04-06 ENCOUNTER — Encounter: Payer: Self-pay | Admitting: Obstetrics and Gynecology

## 2015-04-06 DIAGNOSIS — R200 Anesthesia of skin: Secondary | ICD-10-CM | POA: Insufficient documentation

## 2015-04-06 DIAGNOSIS — I6789 Other cerebrovascular disease: Secondary | ICD-10-CM

## 2015-04-06 DIAGNOSIS — I10 Essential (primary) hypertension: Secondary | ICD-10-CM

## 2015-04-06 DIAGNOSIS — I639 Cerebral infarction, unspecified: Principal | ICD-10-CM | POA: Diagnosis present

## 2015-04-06 DIAGNOSIS — E785 Hyperlipidemia, unspecified: Secondary | ICD-10-CM | POA: Insufficient documentation

## 2015-04-06 LAB — LIPID PANEL
CHOL/HDL RATIO: 3.1 ratio
Cholesterol: 187 mg/dL (ref 0–200)
HDL: 61 mg/dL (ref >40)
LDL CALC: 119 mg/dL — AB (ref 0–99)
Triglycerides: 34 mg/dL (ref <150)
VLDL: 7 mg/dL (ref 0–40)

## 2015-04-06 LAB — ANTITHROMBIN III: AntiThromb III Func: 95 % (ref 75–120)

## 2015-04-06 LAB — SEDIMENTATION RATE: SED RATE: 12 mm/h (ref 0–22)

## 2015-04-06 MED ORDER — FERROUS SULFATE 325 (65 FE) MG PO TABS
325.0000 mg | ORAL_TABLET | Freq: Every day | ORAL | Status: DC
  Administered 2015-04-06: 325 mg via ORAL
  Filled 2015-04-06: qty 1

## 2015-04-06 MED ORDER — CLOPIDOGREL BISULFATE 75 MG PO TABS: 75.0000 mg | ORAL_TABLET | Freq: Every day | ORAL | Status: DC

## 2015-04-06 MED ORDER — CLOPIDOGREL BISULFATE 75 MG PO TABS
75.0000 mg | ORAL_TABLET | Freq: Every day | ORAL | Status: DC
  Administered 2015-04-06: 75 mg via ORAL
  Filled 2015-04-06: qty 1

## 2015-04-06 MED ORDER — ATORVASTATIN CALCIUM 10 MG PO TABS: 10.0000 mg | ORAL_TABLET | Freq: Every day | ORAL | Status: DC

## 2015-04-06 MED ORDER — METOPROLOL SUCCINATE ER 25 MG PO TB24
25.0000 mg | ORAL_TABLET | Freq: Every day | ORAL | Status: DC
  Administered 2015-04-06: 25 mg via ORAL
  Filled 2015-04-06: qty 1

## 2015-04-06 MED ORDER — ACETAMINOPHEN 325 MG PO TABS
650.0000 mg | ORAL_TABLET | Freq: Four times a day (QID) | ORAL | Status: DC | PRN
  Administered 2015-04-06 (×2): 650 mg via ORAL
  Filled 2015-04-06 (×2): qty 2

## 2015-04-06 MED ORDER — HEPARIN SODIUM (PORCINE) 5000 UNIT/ML IJ SOLN
5000.0000 [IU] | Freq: Three times a day (TID) | INTRAMUSCULAR | Status: DC
  Administered 2015-04-06 (×2): 5000 [IU] via SUBCUTANEOUS
  Filled 2015-04-06 (×2): qty 1

## 2015-04-06 MED ORDER — STROKE: EARLY STAGES OF RECOVERY BOOK
Freq: Once | Status: DC
  Filled 2015-04-06: qty 1

## 2015-04-06 MED ORDER — ATORVASTATIN CALCIUM 40 MG PO TABS: 40.0000 mg | ORAL_TABLET | Freq: Every day | ORAL | Status: DC

## 2015-04-06 NOTE — ED Notes (Signed)
Patient ambulated to restroom with assistance. No distress noted.

## 2015-04-06 NOTE — Evaluation (Signed)
Physical Therapy Evaluation Patient Details Name: Ruth Conley MRN: [id number] DOB: 09/18/1965 Today's Date: 04/06/2015   History of Present Illness  Pt adm with tingling of lt face, lt foot, and lt hand. MRI showed sub centimeter area of acute infarction right thalamus. PMH - TIA, HTN  Clinical Impression  Pt doing well with mobility and no further PT needed.  Ready for dc from PT standpoint. Pt reported LLE didn't quite feel 100%. Instructed pt that LLE may fatigue more quickly than rt and to be aware of uneven terrain.      Follow Up Recommendations No PT follow up    Equipment Recommendations  None recommended by PT    Recommendations for Other Services       Precautions / Restrictions Precautions Precautions: None      Mobility  Bed Mobility Overal bed mobility: Independent                Transfers Overall transfer level: Independent                  Ambulation/Gait Ambulation/Gait assistance: Independent Ambulation Distance (Feet): 600 Feet Assistive device: None Gait Pattern/deviations: WFL(Within Functional Limits)   Gait velocity interpretation: at or above normal speed for age/gender General Gait Details: Steady gait.  Stairs Stairs: Yes Stairs assistance: Modified independent (Device/Increase time) Stair Management: One rail Right Number of Stairs: 12    Wheelchair Mobility    Modified Rankin (Stroke Patients Only) Modified Rankin (Stroke Patients Only) Pre-Morbid Rankin Score: No symptoms Modified Rankin: No significant disability     Balance Overall balance assessment: Independent;No apparent balance deficits (not formally assessed)                                           Pertinent Vitals/Pain Pain Assessment: No/denies pain    Home Living Family/patient expects to be discharged to:: Private residence                      Prior Function Level of Independence: Independent          Comments: Works.     Hand Dominance   Dominant Hand: Right    Extremity/Trunk Assessment   Upper Extremity Assessment: Defer to OT evaluation           Lower Extremity Assessment: LLE deficits/detail         Communication   Communication: No difficulties  Cognition Arousal/Alertness: Awake/alert Behavior During Therapy: WFL for tasks assessed/performed Overall Cognitive Status: Within Functional Limits for tasks assessed                      General Comments      Exercises        Assessment/Plan    PT Assessment Patent does not need any further PT services  PT Diagnosis Difficulty walking   PT Problem List    PT Treatment Interventions     PT Goals (Current goals can be found in the Care Plan section) Acute Rehab PT Goals PT Goal Formulation: All assessment and education complete, DC therapy    Frequency     Barriers to discharge        Co-evaluation               End of Session   Activity Tolerance: Patient tolerated treatment well Patient left: in bed;with call bell/phone within  reach;Other (comment) (MD)           [id number] PT Time Calculation (min) (ACUTE ONLY): 10 min   Charges:   PT Evaluation $Initial PT Evaluation Tier I: 1 Procedure     PT G Codes:        Jartavious Mckimmy 2015/04/23, 1:46 PM [facility] PT [phone]

## 2015-04-06 NOTE — Care Management Note (Signed)
Case Management Note  Patient Details  Name: Ruth Conley MRN: [id number] Date of Birth: 1966/04/28  Subjective/Objective:                 Patient admitted with stroke. Patient is established at [facility]. Has orange card. Patient is not experiencing deficits, and PT did not recommend HH. No further CM needs identified, and patient declines any further assistance.  Action/Plan:  Follow up made at [facility].  Expected Discharge Date:                  Expected Discharge Plan:  Home/Self Care  In-House Referral:     Discharge planning Services  CM Consult  Post Acute Care Choice:    Choice offered to:     DME Arranged:    DME Agency:     HH Arranged:    [place] Agency:     Status of Service:  Completed, signed off  Medicare Important Message Given:    Date Medicare IM Given:    Medicare IM give by:    Date Additional Medicare IM Given:    Additional Medicare Important Message give by:     If discussed at [place] of Stay Meetings, dates discussed:    Additional Comments:  Carles Collet, RN 04/06/2015, 3:44 PM

## 2015-04-06 NOTE — Progress Notes (Signed)
STROKE TEAM PROGRESS NOTE   HISTORY Ruth Conley is an 49 y.o. female with a past medical history significant for HTN, elevated LDL, TIA 4/16 (right sided symptoms at that time), presents to the ED for evaluation of acute onset left sided tingling. She expressed that she went to a local store when suddenly started noting a tingly sensation in her left lips. Went home and the tingling became apparent also in her left fingers and subsequently the left toes. In addition, she tells me that at some point she realized that the left leg was mildly weak but this has resolved. Denies associated HA, vertigo, double vision, difficulty swallowing, slurred speech, confusion, language or vision impairment. CT head did not showed acute abnormality. MRI brain showed a subcentimeter area of acute infarction right thalamus. Takes ASA 81 mg daily. She was last known well 04/05/15 at 7 pm. Patient was not administered TPA secondary to lack of objective neurological deficits. She was admitted for further evaluation and treatment.   SUBJECTIVE (INTERVAL HISTORY) No family is at the bedside.  Overall she feels her condition is rapidly improving. She still has some mild tingling in left fingers and toes. Denies DM, but does have HTN, HLD. TIA in 08/2014 showed right sided transient numbness. Stroke work up at that time negative.   OBJECTIVE Temp:  [98.5 F (36.9 C)-99.9 F (37.7 C)] 98.5 F (36.9 C) (12/05 0747) Pulse Rate:  [98-125] 98 (12/05 0957) Cardiac Rhythm:  [-] Normal sinus rhythm (12/05 0704) Resp:  [14-28] 22 (12/05 0957) BP: (114-167)/(70-101) 131/70 mmHg (12/05 0957) SpO2:  [99 %-100 %] 100 % (12/05 0957) Weight:  [100.154 kg (220 lb 12.8 oz)-101.606 kg (224 lb)] 100.154 kg (220 lb 12.8 oz) (12/05 0227)  CBC:   Recent Labs Lab 04/03/15 1049 04/05/15 2259  WBC 5.0 7.2  NEUTROABS  --  5.2  HGB 10.9* 10.9*  HCT 33.4* 33.6*  MCV 81.7 83.4  PLT 339 0000000    Basic Metabolic Panel:   Recent  Labs Lab 04/05/15 2259  NA 137  K 3.5  CL 106  CO2 24  GLUCOSE 133*  BUN 8  CREATININE 0.79  CALCIUM 8.8*    Lipid Panel:     Component Value Date/Time   CHOL 187 04/06/2015 0250   TRIG 34 04/06/2015 0250   HDL 61 04/06/2015 0250   CHOLHDL 3.1 04/06/2015 0250   VLDL 7 04/06/2015 0250   LDLCALC 119* 04/06/2015 0250   HgbA1c:  Lab Results  Component Value Date   HGBA1C 6.0* 08/30/2014   Urine Drug Screen: No results found for: LABOPIA, COCAINSCRNUR, LABBENZ, AMPHETMU, THCU, LABBARB    IMAGING  Dg Chest 2 View 04/05/2015   No acute cardiopulmonary process seen.   Ct Head Wo Contrast 04/05/2015  No acute intracranial pathology.   Mr Angiogram Neck Wo Contrast 04/06/2015  Normal noncontrast MRA of the neck. Given patient's history of RIGHT carotid stenosis, consider follow-up examination with contrast versus carotid ultrasound if contraindicated.   Mr Brain Wo Contrast 04/06/2015   Subcentimeter acute ischemia RIGHT thalamus. Stable mild white matter changes most compatible chronic small vessel ischemic disease.   Mr Jodene Nam Head/brain Wo Cm 04/06/2015   Normal MRA head.   2D echo - - Left ventricle: The cavity size was normal. Wall thickness was normal. Systolic function was normal. The estimated ejection fraction was in the range of 60% to 65%. Wall motion was normal; there were no regional wall motion abnormalities. Doppler parameters are consistent with abnormal  left ventricular relaxation (grade 1 diastolic dysfunction). - Aortic valve: There was no stenosis. - Mitral valve: There was no significant regurgitation. - Right ventricle: The cavity size was normal. Systolic function was normal. - Pulmonary arteries: No complete TR doppler jet so unable to estimate PA systolic pressure. - Inferior vena cava: The vessel was normal in size. The respirophasic diameter changes were in the normal range (>= 50%), consistent with normal central venous  pressure. - Pericardium, extracardiac: A trivial pericardial effusion was identified posterior to the heart. Impressions: - Normal LV size with EF 60-65%. Normal RV size and systolic function. No significant valvular abnormalities.  PHYSICAL EXAM  Temp:  [98.2 F (36.8 C)-98.8 F (37.1 C)] 98.2 F (36.8 C) (12/05 1419) Pulse Rate:  [95-123] 95 (12/05 1419) Resp:  [15-28] 22 (12/05 0957) BP: (114-160)/(70-101) 134/86 mmHg (12/05 1419) SpO2:  [99 %-100 %] 100 % (12/05 1419) Weight:  [220 lb 12.8 oz (100.154 kg)] 220 lb 12.8 oz (100.154 kg) (12/05 0227)  General - Well nourished, well developed, in no apparent distress.  Ophthalmologic - Sharp disc margins OU.   Cardiovascular - Regular rate and rhythm with no murmur.  Mental Status -  Level of arousal and orientation to time, place, and person were intact. Language including expression, naming, repetition, comprehension was assessed and found intact. Attention span and concentration were normal. Recent and remote memory were intact. Fund of Knowledge was assessed and was intact.  Cranial Nerves II - XII - II - Visual field intact OU. III, IV, VI - Extraocular movements intact. V - Facial sensation intact bilaterally. VII - Facial movement intact bilaterally. VIII - Hearing & vestibular intact bilaterally. X - Palate elevates symmetrically XI - Chin turning & shoulder shrug intact bilaterally. XII - Tongue protrusion intact.  Motor Strength - The patient's strength was normal in all extremities and pronator drift was absent.  Bulk was normal and fasciculations were absent.   Motor Tone - Muscle tone was assessed at the neck and appendages and was normal.  Reflexes - The patient's reflexes were 1+ in all extremities and she had no pathological reflexes.  Sensory - Light touch, temperature/pinprick, vibration and proprioception, and Romberg testing were assessed and were symmetrical.    Coordination - The patient had  normal movements in the hands and feet with no ataxia or dysmetria.  Tremor was absent.  Gait and Station - The patient's transfers, posture, gait, station, and turns were observed as normal.   ASSESSMENT/PLAN [name] is a 49 y.o. female with history of HTN, elevated LDL, TIA 4/16 (right sided symptoms) presenting with left sided tingling and leftleg weakness. She did not receive IV t-PA due to lack of objective neurologic exam on arrival.   Stroke:  Tiny right thalamic infarct secondary to small vessel disease source. Due to young age and minimal risk factors, ongoing workup recommended.   Resultant  Subjective L sided tingling  MRI  R thalamic infarct  MRA head  normal  MRA neck normal  2D Echo  EF 60-65%  Check hypercoagulable labs as possible source of stroke. Recommend 30 day cardiac event monitoring  LDL 119  HgbA1c 6.0  Heparin 5000 units sq tid for VTE prophylaxis Diet Heart Room service appropriate?: Yes; Fluid consistency:: Thin  aspirin 325 mg daily prior to admission, changed to clopidogrel 75 mg daily.  Patient counseled to be compliant with her antithrombotic medications  Ongoing aggressive stroke risk factor management  Therapy recommendations:  No therapy needs  OP TCD with bubble study as an OP  Disposition:  Return home  Tachycardia/palpitations  Up to 130 last night  Lisinopril changed to metoprolol on an OP due to elevated HR  Pt reports palpitations ~once a month  Recommend OP 30 day cardiac monitoring   Hypertension  Stable  Hyperlipidemia  Home meds:  Red rice yeast and omega 3  LDL 119, goal < 70  Add statin - pot concerned about taking statin - wants to start at lowest dose possible - Dr. Erlinda Hong recommends lipitor 10 - dose adjusted. Discussed with pt  Continue statin at discharge  Other Stroke Risk Factor  Hx stroke/TIA  Other Active Problems  Iron deficiency anemia  DUB, for GYN f/u. Consider IUD  Hospital  day # 0  Neurology will sign off. Please call with questions. Pt will follow up with Dr. Erlinda Hong at [facility] in about 2 months. Thanks for the consult.  Rosalin Hawking, MD PhD Stroke Neurology 04/06/2015 11:13 PM      To contact Stroke Continuity provider, please refer to [email or web address] After hours, contact General Neurology

## 2015-04-06 NOTE — Progress Notes (Signed)
  Echocardiogram 2D Echocardiogram has been performed.  Jennette Dubin 04/06/2015, 11:25 AM

## 2015-04-06 NOTE — H&P (Signed)
[facility] History and Physical  Ruth Conley [id number] DOB: 1965-07-09 DOA: 04/05/2015  Referring physician: EDP PCP: Annetta Maw, MD   Chief Complaint: Numbness   HPI: Ruth Conley is a 49 y.o. female who presents to the ED with onset of left sided numbness at 7PM last evening.  Symptoms gradually improving, nothing makes symptoms better or worse, has tried nothing for symptoms.  No headache, no CP, no fever, no rash.  Has h/o TIA in April earlier this year, work up then was significant for R sided carotid stenosis in the 40-59% range.  Review of Systems: Systems reviewed.  As above, otherwise negative  Past Medical History  Diagnosis Date  . Hypertension   . Gestational diabetes   . TIA (transient ischemic attack)    Past Surgical History  Procedure Laterality Date  . Cesarean section     Social History:  reports that she has never smoked. She does not have any smokeless tobacco history on file. She reports that she does not drink alcohol or use illicit drugs.  No Known Allergies  No family history on file.   Prior to Admission medications   Medication Sig Start Date End Date Taking? Authorizing Provider  aspirin 325 MG tablet Take 1 tablet (325 mg total) by mouth daily. 08/31/14  Yes Delfina Redwood, MD  Coenzyme Q-10 100 MG capsule Take 100 mg by mouth daily.   Yes Historical Provider, MD  ferrous sulfate 325 (65 FE) MG tablet Take 1 tablet (325 mg total) by mouth daily with breakfast. 02/16/15  Yes Lance Bosch, NP  metoprolol succinate (TOPROL-XL) 25 MG 24 hr tablet Take 1 tablet (25 mg total) by mouth daily. 04/03/15  Yes Lance Bosch, NP  Omega 3 1200 MG CAPS Take 600 mg by mouth daily.   Yes Historical Provider, MD  Red Yeast Rice Extract (RED YEAST RICE PO) Take 1 capsule by mouth daily.   Yes Historical Provider, MD   Physical Exam: Filed Vitals:   04/06/15 0030 04/06/15 0045  BP: 153/101 149/100  Pulse: 112 116  Temp:    Resp: 18  17    BP 149/100 mmHg  Pulse 116  Temp(Src) 99.7 F (37.6 C) (Oral)  Resp 17  Ht 5\' 7"  (1.702 m)  Wt 101.606 kg (224 lb)  BMI 35.08 kg/m2  SpO2 100%  LMP 04/04/2015  General Appearance:    Alert, oriented, no distress, appears stated age  Head:    Normocephalic, atraumatic  Eyes:    PERRL, EOMI, sclera non-icteric        Nose:   Nares without drainage or epistaxis. Mucosa, turbinates normal  Throat:   Moist mucous membranes. Oropharynx without erythema or exudate.  Neck:   Supple. No carotid bruits.  No thyromegaly.  No lymphadenopathy.   Back:     No CVA tenderness, no spinal tenderness  Lungs:     Clear to auscultation bilaterally, without wheezes, rhonchi or rales  Chest wall:    No tenderness to palpitation  Heart:    Regular rate and rhythm without murmurs, gallops, rubs  Abdomen:     Soft, non-tender, nondistended, normal bowel sounds, no organomegaly  Genitalia:    deferred  Rectal:    deferred  Extremities:   No clubbing, cyanosis or edema.  Pulses:   2+ and symmetric all extremities  Skin:   Skin color, texture, turgor normal, no rashes or lesions  Lymph nodes:   Cervical, supraclavicular, and axillary nodes normal  Neurologic:  Left sided numbness    Labs on Admission:  Basic Metabolic Panel:  Recent Labs Lab 04/05/15 2259  NA 137  K 3.5  CL 106  CO2 24  GLUCOSE 133*  BUN 8  CREATININE 0.79  CALCIUM 8.8*   Liver Function Tests: No results for input(s): AST, ALT, ALKPHOS, BILITOT, PROT, ALBUMIN in the last 168 hours. No results for input(s): LIPASE, AMYLASE in the last 168 hours. No results for input(s): AMMONIA in the last 168 hours. CBC:  Recent Labs Lab 04/03/15 1049 04/05/15 2259  WBC 5.0 7.2  NEUTROABS  --  5.2  HGB 10.9* 10.9*  HCT 33.4* 33.6*  MCV 81.7 83.4  PLT 339 349   Cardiac Enzymes: No results for input(s): CKTOTAL, CKMB, CKMBINDEX, TROPONINI in the last 168 hours.  BNP (last 3 results) No results for input(s): PROBNP in  the last 8760 hours. CBG: No results for input(s): GLUCAP in the last 168 hours.  Radiological Exams on Admission: Dg Chest 2 View  04/05/2015  CLINICAL DATA:  Mild left foot, left finger and left lip numbness. Initial encounter. EXAM: CHEST  2 VIEW COMPARISON:  Chest radiograph from 04/09/2013 FINDINGS: The lungs are well-aerated and clear. There is no evidence of focal opacification, pleural effusion or pneumothorax. The heart is normal in size; the mediastinal contour is within normal limits. No acute osseous abnormalities are seen. IMPRESSION: No acute cardiopulmonary process seen. Electronically Signed   By: Garald Balding M.D.   On: 04/05/2015 22:35   Ct Head Wo Contrast  04/05/2015  CLINICAL DATA:  49 year old female with mild numbness of the left foot and the left fingers. EXAM: CT HEAD WITHOUT CONTRAST TECHNIQUE: Contiguous axial images were obtained from the base of the skull through the vertex without intravenous contrast. COMPARISON:  Brain MRI dated 08/30/2014 FINDINGS: The ventricles and the sulci are appropriate in size for the patient's age. There is no intracranial hemorrhage. No midline shift or mass effect identified. The gray-white matter differentiation is preserved. The visualized paranasal sinuses and mastoid air cells are well aerated. The calvarium is intact. IMPRESSION: No acute intracranial pathology. Electronically Signed   By: Anner Crete M.D.   On: 04/05/2015 23:06   Mr Brain Wo Contrast  04/06/2015  CLINICAL DATA:  Mild LEFT extremity numbness, LEFT facial numbness beginning this evening. History of hypertension, gestational diabetes. EXAM: MRI HEAD WITHOUT CONTRAST TECHNIQUE: Multiplanar, multiecho pulse sequences of the brain and surrounding structures were obtained without intravenous contrast. COMPARISON:  CT head April 05, 2015 at 2241 hours and MRI of the brain August 30, 2014 FINDINGS: 7 mm focus of reduced diffusion RIGHT lateral thalamus with corresponding  low ADC values and very faint FLAIR T2 hyperintense signal. The ventricles and sulci are normal for patient's age. No abnormal parenchymal signal, mass lesions, mass effect. No susceptibility artifact to suggest hemorrhage. A few subcentimeter scattered white matter FLAIR T2 hyperintensities are nonspecific, stable from prior imaging. No abnormal extra-axial fluid collections. No extra-axial masses though, contrast enhanced sequences would be more sensitive. Normal major intracranial vascular flow voids seen at the skull base. Mildly and dolichoectatic intracranial vessels compatible with hypertension. Ocular globes and orbital contents are unremarkable though not tailored for evaluation. No abnormal sellar expansion. Visualized paranasal sinuses and mastoid air cells are well-aerated. No suspicious calvarial bone marrow signal. Craniocervical junction maintained. IMPRESSION: Subcentimeter acute ischemia RIGHT thalamus. Stable mild white matter changes most compatible chronic small vessel ischemic disease. Electronically Signed   By: Thana Farr.D.  On: 04/06/2015 00:19    EKG: Independently reviewed.  Assessment/Plan Active Problems:   Acute ischemic stroke ([place])   1. Acute ischemic stroke, R thalamus - 1. Stroke pathway 2. Plavix ordered for antiplatelet, switching from ASA. 3. Instead of carotid dopplers, Dr. Armida Sans recs MRA neck given the R carotid stenosis finding on carotid doppler earlier this year. 4. Remainder of work up as per pathway.    Code Status: Full Code  Family Communication: Family at bedside Disposition Plan: Admit to inpatient   Time spent: 50 min  GARDNER, JARED M. [facility] Pager [phone]  If 7AM-7PM, please contact the day team taking care of the patient [email or web address] Password [email or web address] 04/06/2015, 1:05 AM

## 2015-04-06 NOTE — ED Notes (Signed)
Attempted to call report to floor nurse. Informed by secretary that nurse in room with patient at this time. Left name & phone number for nurse to return call for report to floor.

## 2015-04-06 NOTE — Discharge Summary (Addendum)
PATIENT DETAILS Name: Ruth Conley Age: 49 y.o. Sex: female Date of Birth: [birth year] MRN: [id number]. Admitting Physician: Etta Quill, DO [id number], DOROTHY, MD  Admit Date: 04/05/2015 Discharge date: 04/06/2015  Recommendations for Outpatient Follow-up:  1. Hypercoagulable and autoimmune panel pending at the time of discharge-please follow.  2. A1c pending at the time of discharge-please follow 3. Please repeat lipid panel in 3 months.  4. Echocardiogram pending-please follow  PRIMARY DISCHARGE DIAGNOSIS:  Active Problems:   Acute ischemic stroke [facility])      PAST MEDICAL HISTORY: Past Medical History  Diagnosis Date  . Hypertension   . Gestational diabetes   . TIA (transient ischemic attack)     DISCHARGE MEDICATIONS: Current Discharge Medication List    START taking these medications   Details  atorvastatin (LIPITOR) 10 MG tablet Take 1 tablet (10 mg total) by mouth daily at 6 PM. Qty: 60 tablet, Refills: 0    clopidogrel (PLAVIX) 75 MG tablet Take 1 tablet (75 mg total) by mouth daily. Qty: 60 tablet, Refills: 0      CONTINUE these medications which have NOT CHANGED   Details  Coenzyme Q-10 100 MG capsule Take 100 mg by mouth daily.    ferrous sulfate 325 (65 FE) MG tablet Take 1 tablet (325 mg total) by mouth daily with breakfast. Qty: 30 tablet, Refills: 3    metoprolol succinate (TOPROL-XL) 25 MG 24 hr tablet Take 1 tablet (25 mg total) by mouth daily. Qty: 30 tablet, Refills: 3   Associated Diagnoses: Essential hypertension    Omega 3 1200 MG CAPS Take 600 mg by mouth daily.    Red Yeast Rice Extract (RED YEAST RICE PO) Take 1 capsule by mouth daily.      STOP taking these medications     aspirin 325 MG tablet         ALLERGIES:  No Known Allergies  BRIEF HPI:  See H&P, Labs, Consult and Test reports for all details in brief, patient was admitted for acute onset of left-sided tingling and numbness.  CONSULTATIONS:    neurology  PERTINENT RADIOLOGIC STUDIES: Dg Chest 2 View  04/05/2015  CLINICAL DATA:  Mild left foot, left finger and left lip numbness. Initial encounter. EXAM: CHEST  2 VIEW COMPARISON:  Chest radiograph from 04/09/2013 FINDINGS: The lungs are well-aerated and clear. There is no evidence of focal opacification, pleural effusion or pneumothorax. The heart is normal in size; the mediastinal contour is within normal limits. No acute osseous abnormalities are seen. IMPRESSION: No acute cardiopulmonary process seen. Electronically Signed   By: Garald Balding M.D.   On: 04/05/2015 22:35   Ct Head Wo Contrast  04/05/2015  CLINICAL DATA:  49 year old female with mild numbness of the left foot and the left fingers. EXAM: CT HEAD WITHOUT CONTRAST TECHNIQUE: Contiguous axial images were obtained from the base of the skull through the vertex without intravenous contrast. COMPARISON:  Brain MRI dated 08/30/2014 FINDINGS: The ventricles and the sulci are appropriate in size for the patient's age. There is no intracranial hemorrhage. No midline shift or mass effect identified. The gray-white matter differentiation is preserved. The visualized paranasal sinuses and mastoid air cells are well aerated. The calvarium is intact. IMPRESSION: No acute intracranial pathology. Electronically Signed   By: Anner Crete M.D.   On: 04/05/2015 23:06   Mr Angiogram Neck Wo Contrast  04/06/2015  CLINICAL DATA:  LEFT-sided numbness beginning 7 p.m. last night, gradually improving. Known 54 -59% RIGHT carotid stenosis.  EXAM: MRA NECK WITHOUT CONTRAST MRA HEAD WITHOUT CONTRAST TECHNIQUE: Multiplanar and multiecho pulse sequences of the neck were obtained without intravenous contrast. Angiographic images of the neck were obtained using MRA technique without intravenous contrast.; Angiographic images of the Circle of Willis were obtained using MRA technique without intravenous contrast. COMPARISON:  MRI head April 05, 2015 and MRA  head August 30, 2014 FINDINGS: MRA NECK FINDINGS Limited assessment of arch vessels by noncontrast examination. Imaged RIGHT internal carotid artery origin is widely patent. Bilateral Common carotid arteries appear widely patent. Normal appearance of the internal carotid arteries without hemodynamically significant stenosis. Origins of vertebral arteries not well imaged due to noncontrast technique. Normal appearance of codominant vertebral arteries, widely patent throughout the course. MRA HEAD FINDINGS Anterior circulation: Normal flow related enhancement of the included cervical, petrous, cavernous and supraclinoid internal carotid arteries. Patent anterior communicating artery. Normal flow related enhancement of the anterior and middle cerebral arteries, including distal segments. No large vessel occlusion, high-grade stenosis, abnormal luminal irregularity, aneurysm. Posterior circulation: Codominant vertebral artery's. Basilar artery is patent, with normal flow related enhancement of the main branch vessels. Small RIGHT posterior communicating artery present. Normal flow related enhancement of the posterior cerebral arteries. No large vessel occlusion, high-grade stenosis, abnormal luminal irregularity, aneurysm. IMPRESSION: Normal noncontrast MRA of the neck. Given patient's history of RIGHT carotid stenosis, consider follow-up examination with contrast versus carotid ultrasound if contraindicated. Normal MRA head. Electronically Signed   By: Elon Alas M.D.   On: 04/06/2015 02:47   Mr Brain Wo Contrast  04/06/2015  CLINICAL DATA:  Mild LEFT extremity numbness, LEFT facial numbness beginning this evening. History of hypertension, gestational diabetes. EXAM: MRI HEAD WITHOUT CONTRAST TECHNIQUE: Multiplanar, multiecho pulse sequences of the brain and surrounding structures were obtained without intravenous contrast. COMPARISON:  CT head April 05, 2015 at 2241 hours and MRI of the brain August 30, 2014  FINDINGS: 7 mm focus of reduced diffusion RIGHT lateral thalamus with corresponding low ADC values and very faint FLAIR T2 hyperintense signal. The ventricles and sulci are normal for patient's age. No abnormal parenchymal signal, mass lesions, mass effect. No susceptibility artifact to suggest hemorrhage. A few subcentimeter scattered white matter FLAIR T2 hyperintensities are nonspecific, stable from prior imaging. No abnormal extra-axial fluid collections. No extra-axial masses though, contrast enhanced sequences would be more sensitive. Normal major intracranial vascular flow voids seen at the skull base. Mildly and dolichoectatic intracranial vessels compatible with hypertension. Ocular globes and orbital contents are unremarkable though not tailored for evaluation. No abnormal sellar expansion. Visualized paranasal sinuses and mastoid air cells are well-aerated. No suspicious calvarial bone marrow signal. Craniocervical junction maintained. IMPRESSION: Subcentimeter acute ischemia RIGHT thalamus. Stable mild white matter changes most compatible chronic small vessel ischemic disease. Electronically Signed   By: Elon Alas M.D.   On: 04/06/2015 00:19   Mr Jodene Nam Head/brain Wo Cm  04/06/2015  CLINICAL DATA:  LEFT-sided numbness beginning 7 p.m. last night, gradually improving. Known 71 -59% RIGHT carotid stenosis. EXAM: MRA NECK WITHOUT CONTRAST MRA HEAD WITHOUT CONTRAST TECHNIQUE: Multiplanar and multiecho pulse sequences of the neck were obtained without intravenous contrast. Angiographic images of the neck were obtained using MRA technique without intravenous contrast.; Angiographic images of the Circle of Willis were obtained using MRA technique without intravenous contrast. COMPARISON:  MRI head April 05, 2015 and MRA head August 30, 2014 FINDINGS: MRA NECK FINDINGS Limited assessment of arch vessels by noncontrast examination. Imaged RIGHT internal carotid artery origin is widely patent.  Bilateral  Common carotid arteries appear widely patent. Normal appearance of the internal carotid arteries without hemodynamically significant stenosis. Origins of vertebral arteries not well imaged due to noncontrast technique. Normal appearance of codominant vertebral arteries, widely patent throughout the course. MRA HEAD FINDINGS Anterior circulation: Normal flow related enhancement of the included cervical, petrous, cavernous and supraclinoid internal carotid arteries. Patent anterior communicating artery. Normal flow related enhancement of the anterior and middle cerebral arteries, including distal segments. No large vessel occlusion, high-grade stenosis, abnormal luminal irregularity, aneurysm. Posterior circulation: Codominant vertebral artery's. Basilar artery is patent, with normal flow related enhancement of the main branch vessels. Small RIGHT posterior communicating artery present. Normal flow related enhancement of the posterior cerebral arteries. No large vessel occlusion, high-grade stenosis, abnormal luminal irregularity, aneurysm. IMPRESSION: Normal noncontrast MRA of the neck. Given patient's history of RIGHT carotid stenosis, consider follow-up examination with contrast versus carotid ultrasound if contraindicated. Normal MRA head. Electronically Signed   By: Elon Alas M.D.   On: 04/06/2015 02:47     PERTINENT LAB RESULTS: CBC:  Recent Labs  04/05/15 2259  WBC 7.2  HGB 10.9*  HCT 33.6*  PLT 349   CMET CMP     Component Value Date/Time   NA 137 04/05/2015 2259   K 3.5 04/05/2015 2259   CL 106 04/05/2015 2259   CO2 24 04/05/2015 2259   GLUCOSE 133* 04/05/2015 2259   BUN 8 04/05/2015 2259   CREATININE 0.79 04/05/2015 2259   CREATININE 0.71 02/02/2015 1152   CALCIUM 8.8* 04/05/2015 2259   PROT 7.0 10/20/2007 1113   ALBUMIN 3.8 10/20/2007 1113   AST 16 10/20/2007 1113   ALT 12 10/20/2007 1113   ALKPHOS 60 10/20/2007 1113   BILITOT 0.7 10/20/2007 1113   GFRNONAA >60  04/05/2015 2259   GFRAA >60 04/05/2015 2259    GFR Estimated Creatinine Clearance: 105.7 mL/min (by C-G formula based on Cr of 0.79). No results for input(s): LIPASE, AMYLASE in the last 72 hours. No results for input(s): CKTOTAL, CKMB, CKMBINDEX, TROPONINI in the last 72 hours. Invalid input(s): POCBNP No results for input(s): DDIMER in the last 72 hours. No results for input(s): HGBA1C in the last 72 hours.  Recent Labs  04/06/15 0250  CHOL 187  HDL 61  LDLCALC 119*  TRIG 34  CHOLHDL 3.1   No results for input(s): TSH, T4TOTAL, T3FREE, THYROIDAB in the last 72 hours.  Invalid input(s): FREET3 No results for input(s): VITAMINB12, FOLATE, FERRITIN, TIBC, IRON, RETICCTPCT in the last 72 hours. Coags: No results for input(s): INR in the last 72 hours.  Invalid input(s): PT Microbiology: No results found for this or any previous visit (from the past 240 hour(s)).   BRIEF HOSPITAL COURSE:   Active Problems: Acute ischemic stroke: etiology uncertain-but patient has a history of hypertension and dyslipidemia.MRI brain showed a right thalamic infarct, Echocardiogram pending at time of discharge-but recent echocardiogram in April of this year was negative for any embolic source. Carotid Doppler earlier this year (08/31/14) showed approximately 40-59% stenosis. Underwent a MRA of the neck this admission which was negative for any stenosis. This MD spoke with Dr. Cleon Dew not advise any further radiological studies. He recommended hypercoagulable/autoimmune workup prior to discharge, he will arrange for outpatient bubble study. Recommendations are switched to Plavix, continue 10 mg of Lipitor and discharge.  Note-nonfocal exam at the time of discharge. Speech clear.  Hypertension: Continue with metoprolol.  Dyslipidemia:LDL 119, goal < 70   TODAY-DAY OF DISCHARGE:  Subjective:  Ruth Conley today has no headache,no chest abdominal pain,no new weakness tingling or numbness, feels  much better wants to go home today.  Objective:   Blood pressure 131/70, pulse 98, temperature 98.5 F (36.9 C), temperature source Oral, resp. rate 22, height 5\' 7"  (1.702 m), weight 100.154 kg (220 lb 12.8 oz), last menstrual period 04/04/2015, SpO2 100 %.  Intake/Output Summary (Last 24 hours) at 04/06/15 1340 Last data filed at 04/06/15 1106  Gross per 24 hour  Intake      0 ml  Output    500 ml  Net   -500 ml   Filed Weights   04/05/15 2151 04/06/15 0227  Weight: 101.606 kg (224 lb) 100.154 kg (220 lb 12.8 oz)    Exam Awake Alert, Oriented *3, No new F.N deficits, Normal affect [place].AT,PERRAL Supple Neck,No JVD, No cervical lymphadenopathy appriciated.  Symmetrical Chest wall movement, Good air movement bilaterally, CTAB RRR,No Gallops,Rubs or new Murmurs, No Parasternal Heave +ve B.Sounds, Abd Soft, Non tender, No organomegaly appriciated, No rebound -guarding or rigidity. No Cyanosis, Clubbing or edema, No new Rash or bruise  DISCHARGE CONDITION: Stable  DISPOSITION: Home  DISCHARGE INSTRUCTIONS:    Activity:  As tolerated   Get Medicines reviewed and adjusted: Please take all your medications with you for your next visit with your Primary MD  Please request your Primary MD to go over all hospital tests and procedure/radiological results at the follow up, please ask your Primary MD to get all Hospital records sent to his/her office.  If you experience worsening of your admission symptoms, develop shortness of breath, life threatening emergency, suicidal or homicidal thoughts you must seek medical attention immediately by calling 911 or calling your MD immediately  if symptoms less severe.  You must read complete instructions/literature along with all the possible adverse reactions/side effects for all the Medicines you take and that have been prescribed to you. Take any new Medicines after you have completely understood and accpet all the possible adverse  reactions/side effects.   Do not drive when taking Pain medications.   Do not take more than prescribed Pain, Sleep and Anxiety Medications  Special Instructions: If you have smoked or chewed Tobacco  in the last 2 yrs please stop smoking, stop any regular Alcohol  and or any Recreational drug use.  Wear Seat belts while driving.  Please note  You were cared for by a hospitalist during your hospital stay. Once you are discharged, your primary care physician will handle any further medical issues. Please note that NO REFILLS for any discharge medications will be authorized once you are discharged, as it is imperative that you return to your primary care physician (or establish a relationship with a primary care physician if you do not have one) for your aftercare needs so that they can reassess your need for medications and monitor your lab values.   Diet recommendation: Heart Healthy diet   Discharge Instructions    Ambulatory referral to Neurology    Complete by:  As directed      Call MD for:  extreme fatigue    Complete by:  As directed      Call MD for:  persistant dizziness or light-headedness    Complete by:  As directed      Diet - low sodium heart healthy    Complete by:  As directed      Increase activity slowly    Complete by:  As directed  Follow-up Information    Follow up with Annetta Maw, MD. Schedule an appointment as soon as possible for a visit in 1 week.   Specialty:  Internal Medicine   Why:  Hospital follow up   Contact information:   [address] [place] [address] [phone]       Follow up with Xu,Jindong, MD.   Specialty:  Neurology   Why:  office will call you for a follow up appointment-if you do not hear from them in the next week or so-please call and make a appointment.    Contact information:   [place] [address] [phone]      Total Time spent on discharge equals 25  minutes.  SignedOren Binet 04/06/2015 1:40 PM

## 2015-04-06 NOTE — Consult Note (Signed)
Referring Physician: Dr Sabra Heck, ED    Chief Complaint: left sided tingling, stroke on MRI  HPI:                                                                                                                                         Ruth Conley is an 49 y.o. female with a past medical history significant for HTN, elevated LDL, TIA 4/16 (right sided symptoms at that time), presents to the ED for evaluation of acute onset left sided tingling. She expressed that she went to a local store when suddenly started noting a tingly sensation in her left lips. Went home and the tingling became apparent also in her left fingers and subsequently the left toes. In addition, she tells me that at some point she realized that the left leg was mildly weak but this has resolved. Denies associated HA, vertigo, double vision, difficulty swallowing, slurred speech, confusion, language or vision impairment. CT head did not showed acute abnormality. MRI brain was personally reviewed and showed a subcentimeter area of acute infarction right thalamus. Takes ASA 81 mg daily.  Date last known well: 04/05/15 Time last known well: 7 pm tPA Given: no, lack of objective neurological deficits   Past Medical History  Diagnosis Date  . Hypertension   . Gestational diabetes   . TIA (transient ischemic attack)     Past Surgical History  Procedure Laterality Date  . Cesarean section      No family history on file. Social History:  reports that she has never smoked. She does not have any smokeless tobacco history on file. She reports that she does not drink alcohol or use illicit drugs. Family history: no epilepsy, MS, or brain tumor Allergies: No Known Allergies  Medications:                                                                                                                           Scheduled: .  stroke: mapping our early stages of recovery book   Does not apply Once  . clopidogrel  75 mg Oral Daily   . ferrous sulfate  325 mg Oral Q breakfast  . heparin  5,000 Units Subcutaneous 3 times per day  . metoprolol succinate  25 mg Oral Daily    ROS:  History obtained from chart review and the patient  General ROS: negative for - chills, fatigue, fever, night sweats, weight gain or weight loss Psychological ROS: negative for - behavioral disorder, hallucinations, memory difficulties, mood swings or suicidal ideation Ophthalmic ROS: negative for - blurry vision, double vision, eye pain or loss of vision ENT ROS: negative for - epistaxis, nasal discharge, oral lesions, sore throat, tinnitus or vertigo Allergy and Immunology ROS: negative for - hives or itchy/watery eyes Hematological and Lymphatic ROS: negative for - bleeding problems, bruising or swollen lymph nodes Endocrine ROS: negative for - galactorrhea, hair pattern changes, polydipsia/polyuria or temperature intolerance Respiratory ROS: negative for - cough, hemoptysis, shortness of breath or wheezing Cardiovascular ROS: negative for - chest pain, dyspnea on exertion, edema or irregular heartbeat Gastrointestinal ROS: negative for - abdominal pain, diarrhea, hematemesis, nausea/vomiting or stool incontinence Genito-Urinary ROS: negative for - dysuria, hematuria, incontinence or urinary frequency/urgency Musculoskeletal ROS: negative for - joint swelling or muscular weakness Neurological ROS: as noted in HPI Dermatological ROS: negative for rash and skin lesion changes   Physical exam:  Constitutional: well developed, pleasant female in no apparent distress. Blood pressure 149/100, pulse 116, temperature 99.7 F (37.6 C), temperature source Oral, resp. rate 17, height '5\' 7"'  (1.702 m), weight 101.606 kg (224 lb), last menstrual period 04/04/2015, SpO2 100 %. Eyes: no jaundice or exophthalmos.  Head:  normocephalic. Neck: supple, no bruits, no JVD. Cardiac: no murmurs. Lungs: clear. Abdomen: soft, no tender, no mass. Extremities: no edema, clubbing, or cyanosis.  Skin: no rash Neurologic Examination:                                                                                                      General: NAD Mental Status: Alert, oriented, thought content appropriate.  Speech fluent without evidence of aphasia.  Able to follow 3 step commands without difficulty. Cranial Nerves: II: Discs flat bilaterally; Visual fields grossly normal, pupils equal, round, reactive to light and accommodation III,IV, VI: ptosis not present, extra-ocular motions intact bilaterally V,VII: smile symmetric, facial light touch sensation normal bilaterally VIII: hearing normal bilaterally IX,X: uvula rises symmetrically XI: bilateral shoulder shrug XII: midline tongue extension without atrophy or fasciculations  Motor: Right : Upper extremity   5/5    Left:     Upper extremity   5/5  Lower extremity   5/5     Lower extremity   5/5 Tone and bulk:normal tone throughout; no atrophy noted Sensory: Pinprick and light touch intact throughout, bilaterally Deep Tendon Reflexes:  Right: Upper Extremity   Left: Upper extremity   biceps (C-5 to C-6) 2/4   biceps (C-5 to C-6) 2/4 tricep (C7) 2/4    triceps (C7) 2/4 Brachioradialis (C6) 2/4  Brachioradialis (C6) 2/4  Lower Extremity Lower Extremity  quadriceps (L-2 to L-4) 2/4   quadriceps (L-2 to L-4) 2/4 Achilles (S1) 2/4   Achilles (S1) 2/4  Plantars: Right: downgoing   Left: downgoing Cerebellar: normal finger-to-nose,  normal heel-to-shin test Gait:  No tested due to multiple leads    Results for orders placed  or performed during the hospital encounter of 04/05/15 (from the past 48 hour(s))  I-Stat Troponin, ED (not at [facility])     Status: None   Collection Time: 04/05/15 10:57 PM  Result Value Ref Range   Troponin i, poc 0.01 0.00 - 0.08 ng/mL    Comment 3            Comment: Due to the release kinetics of cTnI, a negative result within the first hours of the onset of symptoms does not rule out myocardial infarction with certainty. If myocardial infarction is still suspected, repeat the test at appropriate intervals.   CBC with Differential     Status: Abnormal   Collection Time: 04/05/15 10:59 PM  Result Value Ref Range   WBC 7.2 4.0 - 10.5 K/uL   RBC 4.03 3.87 - 5.11 MIL/uL   Hemoglobin 10.9 (L) 12.0 - 15.0 g/dL   HCT 33.6 (L) 36.0 - 46.0 %   MCV 83.4 78.0 - 100.0 fL   MCH 27.0 26.0 - 34.0 pg   MCHC 32.4 30.0 - 36.0 g/dL   RDW 15.8 (H) 11.5 - 15.5 %   Platelets 349 150 - 400 K/uL   Neutrophils Relative % 73 %   Neutro Abs 5.2 1.7 - 7.7 K/uL   Lymphocytes Relative 20 %   Lymphs Abs 1.4 0.7 - 4.0 K/uL   Monocytes Relative 6 %   Monocytes Absolute 0.4 0.1 - 1.0 K/uL   Eosinophils Relative 1 %   Eosinophils Absolute 0.1 0.0 - 0.7 K/uL   Basophils Relative 0 %   Basophils Absolute 0.0 0.0 - 0.1 K/uL  Basic metabolic panel     Status: Abnormal   Collection Time: 04/05/15 10:59 PM  Result Value Ref Range   Sodium 137 135 - 145 mmol/L   Potassium 3.5 3.5 - 5.1 mmol/L   Chloride 106 101 - 111 mmol/L   CO2 24 22 - 32 mmol/L   Glucose, Bld 133 (H) 65 - 99 mg/dL   BUN 8 6 - 20 mg/dL   Creatinine, Ser 0.79 0.44 - 1.00 mg/dL   Calcium 8.8 (L) 8.9 - 10.3 mg/dL   GFR calc non Af Amer >60 >60 mL/min   GFR calc Af Amer >60 >60 mL/min    Comment: (NOTE) The eGFR has been calculated using the CKD EPI equation. This calculation has not been validated in all clinical situations. eGFR's persistently <60 mL/min signify possible Chronic Kidney Disease.    Anion gap 7 5 - 15   Dg Chest 2 View  04/05/2015  CLINICAL DATA:  Mild left foot, left finger and left lip numbness. Initial encounter. EXAM: CHEST  2 VIEW COMPARISON:  Chest radiograph from 04/09/2013 FINDINGS: The lungs are well-aerated and clear. There is no evidence of focal  opacification, pleural effusion or pneumothorax. The heart is normal in size; the mediastinal contour is within normal limits. No acute osseous abnormalities are seen. IMPRESSION: No acute cardiopulmonary process seen. Electronically Signed   By: Garald Balding M.D.   On: 04/05/2015 22:35   Ct Head Wo Contrast  04/05/2015  CLINICAL DATA:  49 year old female with mild numbness of the left foot and the left fingers. EXAM: CT HEAD WITHOUT CONTRAST TECHNIQUE: Contiguous axial images were obtained from the base of the skull through the vertex without intravenous contrast. COMPARISON:  Brain MRI dated 08/30/2014 FINDINGS: The ventricles and the sulci are appropriate in size for the patient's age. There is no intracranial hemorrhage. No midline shift or mass  effect identified. The gray-white matter differentiation is preserved. The visualized paranasal sinuses and mastoid air cells are well aerated. The calvarium is intact. IMPRESSION: No acute intracranial pathology. Electronically Signed   By: Anner Crete M.D.   On: 04/05/2015 23:06   Mr Brain Wo Contrast  04/06/2015  CLINICAL DATA:  Mild LEFT extremity numbness, LEFT facial numbness beginning this evening. History of hypertension, gestational diabetes. EXAM: MRI HEAD WITHOUT CONTRAST TECHNIQUE: Multiplanar, multiecho pulse sequences of the brain and surrounding structures were obtained without intravenous contrast. COMPARISON:  CT head April 05, 2015 at 2241 hours and MRI of the brain August 30, 2014 FINDINGS: 7 mm focus of reduced diffusion RIGHT lateral thalamus with corresponding low ADC values and very faint FLAIR T2 hyperintense signal. The ventricles and sulci are normal for patient's age. No abnormal parenchymal signal, mass lesions, mass effect. No susceptibility artifact to suggest hemorrhage. A few subcentimeter scattered white matter FLAIR T2 hyperintensities are nonspecific, stable from prior imaging. No abnormal extra-axial fluid collections. No  extra-axial masses though, contrast enhanced sequences would be more sensitive. Normal major intracranial vascular flow voids seen at the skull base. Mildly and dolichoectatic intracranial vessels compatible with hypertension. Ocular globes and orbital contents are unremarkable though not tailored for evaluation. No abnormal sellar expansion. Visualized paranasal sinuses and mastoid air cells are well-aerated. No suspicious calvarial bone marrow signal. Craniocervical junction maintained. IMPRESSION: Subcentimeter acute ischemia RIGHT thalamus. Stable mild white matter changes most compatible chronic small vessel ischemic disease. Electronically Signed   By: Elon Alas M.D.   On: 04/06/2015 00:19     Assessment: 49 y.o. female presents with acute onset left lips-fingers-toes tingling. MRI revealed a sub centimeter area of acute infarction right thalamus, likely small vessel disease. Failed ASA, thus consider switching to plavix. Stroke work up. Stroke team will follow up tomorrow.  Stroke Risk Factors -HTN, dyslipidemia, TIA  Plan: 1. HgbA1c, fasting lipid panel 2. MRI, MRA  of the brain and neck without contrast 3. Echocardiogram 4. Prophylactic therapy-plavix 5. Risk factor modification 6. Telemetry monitoring 7. Frequent neuro checks 8. PT/OT SLP 9. NPO  Dorian Pod, MD [facility] [phone]  04/06/2015, 1:04 AM

## 2015-04-06 NOTE — ED Notes (Signed)
MD at bedside at this time with patient/family.

## 2015-04-06 NOTE — ED Notes (Signed)
Patient transported to MRI via stretcher.

## 2015-04-06 NOTE — Evaluation (Signed)
Occupational Therapy Evaluation Patient Details Name: Ruth Conley MRN: [id number] DOB: [birth year] Today's Date: 04/06/2015    History of Present Illness Pt adm with tingling of lt face, lt foot, and lt hand. MRI showed sub centimeter area of acute infarction right thalamus. PMH - TIA, HTN   Clinical Impression   Patient evaluated by Occupational Therapy with no further acute OT needs identified. All education has been completed and the patient has no further questions. Pt is independent with ADLs.  She demonstrates very mild coordination deficits Lt hand, but is able to perform all activities needed so it impacts her very minimally.  She is able to verbalize BEFAST.  All education completed. See below for any follow-up Occupational Therapy or equipment needs. OT is signing off. Thank you for this referral.      Follow Up Recommendations  No OT follow up    Equipment Recommendations  None recommended by OT    Recommendations for Other Services       Precautions / Restrictions Precautions Precautions: None      Mobility Bed Mobility Overal bed mobility: Independent                Transfers Overall transfer level: Independent                    Balance Overall balance assessment: Independent                                          ADL Overall ADL's : Independent                                             Vision     Perception     Praxis      Pertinent Vitals/Pain Pain Assessment: No/denies pain     Hand Dominance Right   Extremity/Trunk Assessment Upper Extremity Assessment Upper Extremity Assessment: LUE deficits/detail LUE Deficits / Details: strength 5/5.  Pt with report of pins and needles sensation Lt hand.  She demonstrates slight deficits with coordination, but she is able to perform all necessary tasks  LUE Sensation: decreased light touch LUE Coordination: decreased fine motor   Lower  Extremity Assessment Lower Extremity Assessment: Defer to PT evaluation LLE Sensation: decreased light touch   Cervical / Trunk Assessment Cervical / Trunk Assessment: Normal   Communication Communication Communication: No difficulties   Cognition Arousal/Alertness: Awake/alert Behavior During Therapy: WFL for tasks assessed/performed Overall Cognitive Status: Within Functional Limits for tasks assessed                     General Comments       Exercises       Shoulder Instructions      Home Living Family/patient expects to be discharged to:: Private residence Living Arrangements: Other relatives                                      Prior Functioning/Environment Level of Independence: Independent        Comments: Works.    OT Diagnosis:     OT Problem List:     OT Treatment/Interventions:      OT Goals(Current goals  can be found in the care plan section) Acute Rehab OT Goals OT Goal Formulation: All assessment and education complete, DC therapy  OT Frequency:     Barriers to D/C:            Co-evaluation              End of Session    Activity Tolerance: Patient tolerated treatment well Patient left: in bed;with call bell/phone within reach   Time: 1424-1439 OT Time Calculation (min): 15 min Charges:  OT General Charges $OT Visit: 1 Procedure OT Evaluation $Initial OT Evaluation Tier I: 1 Procedure G-Codes:    Ruth Conley 03-May-2015, 3:29 PM

## 2015-04-06 NOTE — Progress Notes (Signed)
Nsg Discharge Note  Admit Date:  04/05/2015 Discharge date: 04/06/2015   Ruth Conley to be D/C'd Home per MD order.  AVS completed.  Copy for chart, and copy for patient signed, and dated. Patient/caregiver able to verbalize understanding.  Discharge Medication:   Medication List    STOP taking these medications        aspirin 325 MG tablet      TAKE these medications        atorvastatin 10 MG tablet  Commonly known as:  LIPITOR  Take 1 tablet (10 mg total) by mouth daily at 6 PM.     clopidogrel 75 MG tablet  Commonly known as:  PLAVIX  Take 1 tablet (75 mg total) by mouth daily.     Coenzyme Q-10 100 MG capsule  Take 100 mg by mouth daily.     ferrous sulfate 325 (65 FE) MG tablet  Take 1 tablet (325 mg total) by mouth daily with breakfast.     metoprolol succinate 25 MG 24 hr tablet  Commonly known as:  TOPROL-XL  Take 1 tablet (25 mg total) by mouth daily.     Omega 3 1200 MG Caps  Take 600 mg by mouth daily.     RED YEAST RICE PO  Take 1 capsule by mouth daily.        Discharge Assessment: Filed Vitals:   04/06/15 0957 04/06/15 1419  BP: 131/70 134/86  Pulse: 98 95  Temp:  98.2 F (36.8 C)  Resp: 22    Skin clean, dry and intact without evidence of skin break down, no evidence of skin tears noted. IV catheter discontinued intact. Site without signs and symptoms of complications - no redness or edema noted at insertion site, patient denies c/o pain - only slight tenderness at site.  Dressing with slight pressure applied.  D/c Instructions-Education: Discharge instructions given to patient/family with verbalized understanding. D/c education completed with patient/family including follow up instructions, medication list, d/c activities limitations if indicated, with other d/c instructions as indicated by MD - patient able to verbalize understanding, all questions fully answered. Patient instructed to return to ED, call 911, or call MD for any changes  in condition.  Patient escorted via [place], and D/C home via private auto.  Salley Slaughter, RN 04/06/2015 4:48 PM

## 2015-04-07 LAB — SJOGRENS SYNDROME-B EXTRACTABLE NUCLEAR ANTIBODY

## 2015-04-07 LAB — ANTINUCLEAR ANTIBODIES, IFA: ANTINUCLEAR ANTIBODIES, IFA: NEGATIVE

## 2015-04-07 LAB — BETA-2-GLYCOPROTEIN I ABS, IGG/M/A
Beta-2 Glyco I IgG: <9 GPI IgG units (ref 0–20)
Beta-2-Glycoprotein I IgM: <9 GPI IgM units (ref 0–32)

## 2015-04-07 LAB — ANTI-DNA ANTIBODY, DOUBLE-STRANDED

## 2015-04-07 LAB — ANCA TITERS

## 2015-04-07 LAB — HOMOCYSTEINE: HOMOCYSTEINE-NORM: 4.9 umol/L (ref 0.0–15.0)

## 2015-04-07 LAB — COMPLEMENT, TOTAL

## 2015-04-07 LAB — C4 COMPLEMENT: Complement C4, Body Fluid: 33 mg/dL (ref 14–44)

## 2015-04-07 LAB — RHEUMATOID FACTOR

## 2015-04-07 LAB — CARDIOLIPIN ANTIBODIES, IGG, IGM, IGA: Anticardiolipin IgM: <9 MPL U/mL (ref 0–12)

## 2015-04-07 LAB — PROTEIN C, TOTAL: Protein C, Total: 85 % (ref 60–150)

## 2015-04-07 LAB — HIV ANTIBODY (ROUTINE TESTING W REFLEX): HIV Screen 4th Generation wRfx: NONREACTIVE

## 2015-04-07 LAB — HEMOGLOBIN A1C
Hgb A1c MFr Bld: 5.6 % (ref 4.8–5.6)
Mean Plasma Glucose: 114 mg/dL

## 2015-04-07 LAB — C3 COMPLEMENT: C3 Complement: 122 mg/dL (ref 82–167)

## 2015-04-07 LAB — SICKLE CELL SCREEN: Sickle Cell Screen: NEGATIVE

## 2015-04-07 LAB — RPR: RPR: NONREACTIVE

## 2015-04-07 LAB — SJOGRENS SYNDROME-A EXTRACTABLE NUCLEAR ANTIBODY

## 2015-04-08 LAB — PROTEIN S ACTIVITY: Protein S Activity: 83 % (ref 63–140)

## 2015-04-08 LAB — PROTEIN S, TOTAL: Protein S Ag, Total: 96 % (ref 60–150)

## 2015-04-08 LAB — PROTEIN C ACTIVITY: PROTEIN C ACTIVITY: 91 % (ref 73–180)

## 2015-04-08 LAB — LUPUS ANTICOAGULANT PANEL
DRVVT: 36.3 s (ref 0.0–44.0)
PTT LA: 35.3 s (ref 0.0–40.6)

## 2015-04-09 ENCOUNTER — Telehealth: Payer: Self-pay

## 2015-04-09 LAB — FACTOR 5 LEIDEN

## 2015-04-09 LAB — PROTHROMBIN GENE MUTATION

## 2015-04-09 NOTE — Telephone Encounter (Signed)
-----   Message from Lance Bosch, NP sent at 04/08/2015  9:21 PM EST ----- Test results do not reveal a blood clotting disorder

## 2015-04-09 NOTE — Telephone Encounter (Signed)
Spoke with patient this am and she is aware of her lab results 

## 2015-04-15 ENCOUNTER — Ambulatory Visit: Payer: Self-pay | Attending: Internal Medicine | Admitting: Internal Medicine

## 2015-04-15 ENCOUNTER — Ambulatory Visit (INDEPENDENT_AMBULATORY_CARE_PROVIDER_SITE_OTHER): Payer: Self-pay | Admitting: Neurology

## 2015-04-15 ENCOUNTER — Encounter: Payer: Self-pay | Admitting: Internal Medicine

## 2015-04-15 ENCOUNTER — Encounter: Payer: Self-pay | Admitting: Neurology

## 2015-04-15 VITALS — BP 120/90 | HR 87 | Temp 97.3°F | Resp 18 | Ht 67.0 in | Wt 219.0 lb

## 2015-04-15 VITALS — BP 152/103 | HR 95 | Ht 67.0 in | Wt 216.8 lb

## 2015-04-15 DIAGNOSIS — I10 Essential (primary) hypertension: Secondary | ICD-10-CM | POA: Insufficient documentation

## 2015-04-15 DIAGNOSIS — Z7902 Long term (current) use of antithrombotics/antiplatelets: Secondary | ICD-10-CM | POA: Insufficient documentation

## 2015-04-15 DIAGNOSIS — R002 Palpitations: Secondary | ICD-10-CM

## 2015-04-15 DIAGNOSIS — Z8673 Personal history of transient ischemic attack (TIA), and cerebral infarction without residual deficits: Secondary | ICD-10-CM

## 2015-04-15 DIAGNOSIS — E669 Obesity, unspecified: Secondary | ICD-10-CM

## 2015-04-15 DIAGNOSIS — I639 Cerebral infarction, unspecified: Secondary | ICD-10-CM

## 2015-04-15 DIAGNOSIS — Z79899 Other long term (current) drug therapy: Secondary | ICD-10-CM | POA: Insufficient documentation

## 2015-04-15 DIAGNOSIS — E785 Hyperlipidemia, unspecified: Secondary | ICD-10-CM

## 2015-04-15 NOTE — Progress Notes (Deleted)
STROKE NEUROLOGY FOLLOW UP NOTE  NAME: Ruth Conley DOB: 1965-12-14  REASON FOR VISIT: stroke follow up HISTORY FROM: ***  Today we had the pleasure of seeing Ruth Conley in follow-up at our Neurology Clinic. Pt was accompanied by ***.   History Summary Ruth Conley is a 49 y.o. female with history of HTN, elevated LDL, TIA 08/2014 (right sided symptoms) admitted on 04/05/15 for left sided tingling and left leg weakness. She did not receive IV t-PA due to lack of objective neurologic exam on arrival. MRI showed right thalamic small infarct. Other stroke work up including MRA,head and neck, 2D echo and A1C and hypercoagulable and autoimmune work up were all negative except LDL 119. Her ASA was changed to plavix, and lipitor 10 was added. Pt admitted palpitation once a month and her HR during admission up to 130s, lisinopril changed to metoprolol. She was discharged in good condition with recommendation of 30 day monitoring as outpt.  Of note, pt had transient right sided numbness in 08/2014, was admitted to [facility] for TIA work up. All work up were normal except CUS showed right ICA 40-59% stenosis and it is asymptomatic side. She was put on ASA at that time.   Interval History During the interval time, the patient has been doing ***.  Left facial mild numbness, coming back. Left hand feeling numb, coming back. Left leg wobbly, but now back. Frontal area headache, sometimes at back of head.   Not smoker, no OCP.    REVIEW OF SYSTEMS: Full 14 system review of systems performed and notable only for those listed below and in HPI above, all others are negative:  Constitutional:   Cardiovascular:  Ear/Nose/Throat:   Skin:  Eyes:   Respiratory:   Gastroitestinal:   Genitourinary:  Hematology/Lymphatic:   Endocrine:  Musculoskeletal:   Allergy/Immunology:   Neurological:   Psychiatric:  Sleep:   The following represents the patient's updated allergies and side effects  list: No Known Allergies  The neurologically relevant items on the patient's problem list {WERE / WERE [id number] reviewed on today's visit.  Neurologic Examination  A problem focused neurological exam (12 or more points of the single system neurologic examination, vital signs counts as 1 point, cranial nerves count for 8 points) was performed.***  Last menstrual period 04/04/2015.  General - Well nourished, well developed, in no apparent distress***.  Ophthalmologic - Sharp disc margins OU***. Fundi not visualized due to ***.  Cardiovascular - Regular rate and rhythm with no murmur***.  Mental Status -  Level of arousal and orientation to time, place, and person were intact***. Language including expression, naming, repetition, comprehension was assessed and found intact***. Attention span and concentration were normal***. Recent and remote memory were intact***. Fund of Knowledge was assessed and was intact***.  Cranial Nerves II - XII - II - Visual field intact OU***. III, IV, VI - Extraocular movements intact***. V - Facial sensation intact bilaterally***. VII - Facial movement intact bilaterally***. VIII - Hearing & vestibular intact bilaterally***. X - Palate elevates symmetrically***. XI - Chin turning & shoulder shrug intact bilaterally***. XII - Tongue protrusion intact***.  Motor Strength - The patient's strength was normal in all extremities and pronator drift was absent.  Bulk was normal and fasciculations were absent***.   Motor Tone - Muscle tone was assessed at the neck and appendages and was normal***.  Reflexes - The patient's reflexes were 1+ in all extremities and she had no pathological reflexes***.  Sensory - Light touch,  temperature/pinprick, vibration and proprioception, and Romberg testing were assessed and were normal***.    Coordination - The patient had normal movements in the hands and feet with no ataxia or dysmetria.  Tremor was  absent***.  Gait and Station - The patient's transfers, posture, gait, station, and turns were observed as normal***.  Data reviewed: I personally reviewed the images and agree with the radiology interpretations.  CUS 08/31/14 - - The vertebral arteries appear patent with antegrade flow. - Findings consistent with 40 - 59 percent stenosis involving the  right internal carotid artery, based on velocities. There is no  plaque morphology to support stenosis. - Findings consistent with 1- 39 percent stenosis involving the  left internal carotid artery. - ICA/CCA ratio. right = 1.64. left = 1.15  08/30/14 MRI and MRA MRA head: Normal. MRI head: No acute or significant finding. Few tiny white matter foci and not likely of clinical relevance.  Ct Head Wo Contrast 04/05/2015 No acute intracranial pathology.   Mr Angiogram Neck Wo Contrast 04/06/2015 Normal noncontrast MRA of the neck. Given patient's history of RIGHT carotid stenosis, consider follow-up examination with contrast versus carotid ultrasound if contraindicated.   Mr Brain Wo Contrast 04/06/2015 Subcentimeter acute ischemia RIGHT thalamus. Stable mild white matter changes most compatible chronic small vessel ischemic disease.   Mr Jodene Nam Head/brain Wo Cm 04/06/2015 Normal MRA head.   2D echo - - Left ventricle: The cavity size was normal. Wall thickness was normal. Systolic function was normal. The estimated ejection fraction was in the range of 60% to 65%. Wall motion was normal; there were no regional wall motion abnormalities. Doppler parameters are consistent with abnormal left ventricular relaxation (grade 1 diastolic dysfunction). - Aortic valve: There was no stenosis. - Mitral valve: There was no significant regurgitation. - Right ventricle: The cavity size was normal. Systolic function was normal. - Pulmonary arteries: No complete TR doppler jet so unable to estimate PA systolic pressure. -  Inferior vena cava: The vessel was normal in size. The respirophasic diameter changes were in the normal range (>= 50%), consistent with normal central venous pressure. - Pericardium, extracardiac: A trivial pericardial effusion was identified posterior to the heart. Impressions: - Normal LV size with EF 60-65%. Normal RV size and systolic function. No significant valvular abnormalities.   Component     Latest Ref Rng 08/30/2014 04/03/2015 04/06/2015  Cholesterol     0 - 200 mg/dL  181 187  Triglycerides     <150 mg/dL  63 34  HDL Cholesterol     >40 mg/dL  59 61  Total CHOL/HDL Ratio       3.1 3.1  VLDL     0 - 40 mg/dL  13 7  LDL (calc)     0 - 99 mg/dL  109 119 (H)  C-ANCA     Neg:<1:20 titer   <1:20  P-ANCA     Neg:<1:20 titer   <1:20  Atypical P-ANCA titer     Neg:<1:20 titer   <1:20  PTT Lupus Anticoagulant     0.0 - 40.6 sec   35.3  DRVVT     0.0 - 44.0 sec   36.3  Lupus Anticoag Interp        Comment:  Beta-2 Glyco I IgG     0 - 20 GPI IgG units   <9  Beta-2-Glycoprotein I IgM     0 - 32 GPI IgM units   <9  Beta-2-Glycoprotein I IgA  0 - 25 GPI IgA units   <9  Anticardiolipin IgG     0 - 14 GPL U/mL   <9  Anticardiolipin IgM     0 - 12 MPL U/mL   <9  Anticardiolipin IgA     0 - 11 APL U/mL   <9  Hemoglobin A1C     4.8 - 5.6 % 6.0 (H)  5.6  Mean Plasma Glucose      126  114  Recommendations-F5LEID:        Comment  Comment        Comment  Recommendations-PTGENE:        Comment  Additional Information        Comment  TSH     0.350 - 4.500 uIU/mL 1.456    ds DNA Ab     0 - 9 IU/mL   <1  SSA (Ro) (ENA) Antibody, IgG     0.0 - 0.9 AI   <0.2  SSB (La) (ENA) Antibody, IgG     0.0 - 0.9 AI   <0.2  RPR     Non Reactive   Non Reactive  Sed Rate     0 - 22 mm/hr   12  HIV     Non Reactive   Non Reactive  Compl, Total (CH50)     42 - 60 U/mL   > 60  ANA Ab, IFA        Negative  C3 Complement     82 - 167 mg/dL   122  Complement C4,  Body Fluid     14 - 44 mg/dL   33  Antithrombin Activity     75 - 120 %   95  Protein C Activity     73 - 180 %   91  Protein C, Total     60 - 150 %   85  Protein S Activity     63 - 140 %   83  Protein S Ag, Total     60 - 150 %   96  Homocysteine     0.0 - 15.0 umol/L   4.9  Rhuematoid fact SerPl-aCnc     0.0 - 13.9 IU/mL   <10.0  Sickle Cell Screen     Negative   Negative    Assessment: As you may recall, she is a 49 y.o. {Race/ethnicity:17218} female with PMH of ***  Plan: ***  No orders of the defined types were placed in this encounter.    No orders of the defined types were placed in this encounter.    There are no Patient Instructions on file for this visit.  Rosalin Hawking, MD PhD [facility] [address] [place], [place] [address] [phone]

## 2015-04-15 NOTE — Progress Notes (Signed)
STROKE NEUROLOGY FOLLOW UP NOTE  NAME: Ruth Conley DOB: 21-Aug-1965  REASON FOR VISIT: stroke follow up HISTORY FROM: pt and chart  Today we had the pleasure of seeing Ruth Conley in follow-up at our Neurology Clinic. Pt was accompanied by no one.   History Summary  Ruth Conley is a 49 y.o. female with history of HTN, elevated LDL, TIA 08/2014 (right sided symptoms) admitted on 04/05/15 for left sided tingling and left leg weakness. She did not receive IV t-PA due to lack of objective neurologic exam on arrival. MRI showed right thalamic small infarct. Other stroke work up including MRA,head and neck, 2D echo and A1C and hypercoagulable and autoimmune work up were all negative except LDL 119. Her ASA was changed to plavix, and lipitor 10 was added. Pt admitted palpitation once a month and her HR during admission up to 130s, lisinopril changed to metoprolol. She was discharged in good condition with recommendation of 30 day monitoring as outpt.   Of note, pt had transient right sided numbness in 08/2014, was admitted to [facility] for TIA work up. All work up were normal except CUS showed right ICA 40-59% stenosis and it is asymptomatic side. She was put on ASA at that time.   Interval History  During the interval time, the patient has been doing well. Her left facial mild numbness improved. Left hand numbness feeling is coming back. Left leg wobbly resolved. Reported frontal area mild headache with tension at neck and back of head. Admitted anxiety due to recent stroke. Admitted snoring but no apnea. Denies any smoking, illicit drugs or use OCP. BP today is high in clinic 152/103, but not check BP at home. In doctors office before 130/80. Recently started exercise program  REVIEW OF SYSTEMS: Full 14 system review of systems performed and notable only for those listed below and in HPI above, all others are negative:   Constitutional:  Cardiovascular:  Ear/Nose/Throat:  Skin:    Eyes:  Respiratory:  Gastroitestinal:  Genitourinary:  Hematology/Lymphatic:  Endocrine:  Musculoskeletal:  Allergy/Immunology:  Neurological:  Psychiatric:  Sleep:   The following represents the patient's updated allergies and side effects list:  No Known Allergies   The neurologically relevant items on the patient's problem list were reviewed on today's visit.   Neurologic Examination  A problem focused neurological exam (12 or more points of the single system neurologic examination, vital signs counts as 1 point, cranial nerves count for 8 points) was performed.  Last menstrual period 04/04/2015.  General - obese, well developed, in no apparent distress.  Ophthalmologic - Sharp disc margins OU.  Cardiovascular - Regular rate and rhythm.  Mental Status -  Level of arousal and orientation to time, place, and person were intact.  Language including expression, naming, repetition, comprehension was assessed and found intact.  Attention span and concentration were normal.  Recent and remote memory were intact.  Fund of Knowledge was assessed and was intact.  Cranial Nerves II - XII -  II - Visual field intact OU.  III, IV, VI - Extraocular movements intact.  V - Facial sensation intact bilaterally.  VII - Facial movement intact bilaterally.  VIII - Hearing & vestibular intact bilaterally.  X - Palate elevates symmetrically.  XI - Chin turning & shoulder shrug intact bilaterally.  XII - Tongue protrusion intact.  Motor Strength - The patient's strength was normal in all extremities and pronator drift was absent. Bulk was normal and fasciculations were absent.  Motor Tone -  Muscle tone was assessed at the neck and appendages and was normal.  Reflexes - The patient's reflexes were 1+ in all extremities and she had no pathological reflexes.  Sensory - Light touch, temperature/pinprick, vibration and proprioception, and Romberg testing were assessed and were normal.  Coordination  - The patient had normal movements in the hands and feet with no ataxia or dysmetria. Tremor was absent.  Gait and Station - The patient's transfers, posture, gait, station, and turns were observed as normal.   Data reviewed:  I personally reviewed the images and agree with the radiology interpretations.   CUS 08/31/14 - - The vertebral arteries appear patent with antegrade flow.  - Findings consistent with 40 - 59 percent stenosis involving the  right internal carotid artery, based on velocities. There is no  plaque morphology to support stenosis.  - Findings consistent with 1- 39 percent stenosis involving the  left internal carotid artery.  - ICA/CCA ratio. right = 1.64. left = 1.15   08/30/14 MRI and MRA  MRA head: Normal.  MRI head: No acute or significant finding. Few tiny white matter foci and not likely of clinical relevance.   Ct Head Wo Contrast  04/05/2015 No acute intracranial pathology.   Mr Angiogram Neck Wo Contrast  04/06/2015 Normal noncontrast MRA of the neck. Given patient's history of RIGHT carotid stenosis, consider follow-up examination with contrast versus carotid ultrasound if contraindicated.   Mr Brain Wo Contrast  04/06/2015 Subcentimeter acute ischemia RIGHT thalamus. Stable mild white matter changes most compatible chronic small vessel ischemic disease.   Mr Jodene Nam Head/brain Wo Cm  04/06/2015 Normal MRA head.   2D echo - - Left ventricle: The cavity size was normal. Wall thickness was normal. Systolic function was normal. The estimated ejection fraction was in the range of 60% to 65%. Wall motion was normal; there were no regional wall motion abnormalities. Doppler parameters are consistent with abnormal left ventricular relaxation (grade 1 diastolic dysfunction). - Aortic valve: There was no stenosis. - Mitral valve: There was no significant regurgitation. - Right ventricle: The cavity size was normal. Systolic function was normal. - Pulmonary arteries: No  complete TR doppler jet so unable to estimate PA systolic pressure. - Inferior vena cava: The vessel was normal in size. The respirophasic diameter changes were in the normal range (>= 50%), consistent with normal central venous pressure. - Pericardium, extracardiac: A trivial pericardial effusion was identified posterior to the heart. Impressions: - Normal LV size with EF 60-65%. Normal RV size and systolic function. No significant valvular abnormalities.   Component Latest Ref Rng  08/30/2014  04/03/2015  04/06/2015   Cholesterol 0 - 200 mg/dL   181  187   Triglycerides <150 mg/dL   63  34   HDL Cholesterol >40 mg/dL   59  61   Total CHOL/HDL Ratio   3.1  3.1   VLDL 0 - 40 mg/dL   13  7   LDL (calc) 0 - 99 mg/dL   109  119 (H)   C-ANCA Neg:<1:20 titer    <1:20   P-ANCA Neg:<1:20 titer    <1:20   Atypical P-ANCA titer Neg:<1:20 titer    <1:20   PTT Lupus Anticoagulant 0.0 - 40.6 sec    35.3   DRVVT 0.0 - 44.0 sec    36.3   Lupus Anticoag Interp    Comment:   Beta-2 Glyco I IgG 0 - 20 GPI IgG units    <9   Beta-2-Glycoprotein  I IgM 0 - 32 GPI IgM units    <9   Beta-2-Glycoprotein I IgA 0 - 25 GPI IgA units    <9   Anticardiolipin IgG 0 - 14 GPL U/mL    <9   Anticardiolipin IgM 0 - 12 MPL U/mL    <9   Anticardiolipin IgA 0 - 11 APL U/mL    <9   Hemoglobin A1C 4.8 - 5.6 %  6.0 (H)   5.6   Mean Plasma Glucose  126   114   Recommendations-F5LEID:    Comment   Comment    Comment   Recommendations-PTGENE:    Comment   Additional Information    Comment   TSH 0.350 - 4.500 uIU/mL  1.456     ds DNA Ab 0 - 9 IU/mL    <1   SSA (Ro) (ENA) Antibody, IgG 0.0 - 0.9 AI    <0.2   SSB (La) (ENA) Antibody, IgG 0.0 - 0.9 AI    <0.2   RPR Non Reactive    Non Reactive   Sed Rate 0 - 22 mm/hr    12   HIV Non Reactive    Non Reactive   Compl, Total (CH50) 42 - 60 U/mL    > 60   ANA Ab, IFA    Negative   C3 Complement 82 - 167 mg/dL    122   Complement C4, Body  Fluid 14 - 44 mg/dL    33   Antithrombin Activity 75 - 120 %    95   Protein C Activity 73 - 180 %    91   Protein C, Total 60 - 150 %    85   Protein S Activity 63 - 140 %    83   Protein S Ag, Total 60 - 150 %    96   Homocysteine 0.0 - 15.0 umol/L    4.9   Rhuematoid fact SerPl-aCnc 0.0 - 13.9 IU/mL    <10.0   Sickle Cell Screen Negative    Negative    Assessment: As you may recall, she is a 49 y.o. African American female with PMH of HTN and HLD was admitted in 08/2014 for TIA of transient right sided numbness. Work up negative and put on ASA. Admitted again in 04/2015 for left sided numbness, MRI showed right thalamic small infarct. Other stroke work up including MRA,head and neck, 2D echo and A1C and hypercoagulable and autoimmune work up were all negative except LDL 119. Her ASA was changed to plavix, and lipitor 10 was added. Pt admitted palpitation once a month and her HR during admission up to 130s, lisinopril changed to metoprolol. She was discharged in good condition. During the interval time, she has been doing well. Admit snoring but no apnea, not checking BP at home. Will do 30 day monitoring and sleep study. Tension headache recommend relaxation and PRN tylenol  Plan:  - continue plavix and lipitor for stroke prevention - check BP at home and recorder and bring over to PCP for medication adjustment if needed - 30 day cardiac monitoring for evaluation of palpitation. - sleep study to rule out OSA - tension headache managing with relaxation and PRN tylenol - healthy diet and regular exercise - Follow up with your primary care physician for stroke risk factor modification. Recommend maintain blood pressure goal <130/80, diabetes with hemoglobin A1c goal below 6.5% and lipids with LDL cholesterol goal below 70 mg/dL.  - follow up in  3 months  I spent more than 25 minutes of face to face time with the patient. Greater than 50% of time was spent in counseling and coordination  of care. We have discussed about further stroke work up and stroke risk factor modification as well as treatment of tension headache.   Orders Placed This Encounter  Procedures  . Ambulatory referral to Sleep Studies    Referral Priority:  Routine    Referral Type:  Consultation    Referral Reason:  Specialty Services Required    Number of Visits Requested:  1  . Cardiac event monitor    Standing Status: Future     Number of Occurrences:      Standing Expiration Date: 04/15/2016    Scheduling Instructions:     Request cardionet setup. Thank you.    Order Specific Question:  Where should this test be performed?    Answer:  [facility]    No orders of the defined types were placed in this encounter.    Patient Instructions  - continue plavix and lipitor for stroke prevention - check BP at home and recorder and bring over to PCP for medication adjustment if needed - will do 30 day cardiac monitoring for evaluation of palpitation. - will do sleep study to rule out sleep apnea - healthy diet and regular exercise - Follow up with your primary care physician for stroke risk factor modification. Recommend maintain blood pressure goal <130/80, diabetes with hemoglobin A1c goal below 6.5% and lipids with LDL cholesterol goal below 70 mg/dL.  - follow up in 3 months   Rosalin Hawking, MD PhD [facility] [address], [place], [place] [address] [phone]

## 2015-04-15 NOTE — Patient Instructions (Signed)
-   continue plavix and lipitor for stroke prevention - check BP at home and recorder and bring over to PCP for medication adjustment if needed - will do 30 day cardiac monitoring for evaluation of palpitation. - will do sleep study to rule out sleep apnea - healthy diet and regular exercise - Follow up with your primary care physician for stroke risk factor modification. Recommend maintain blood pressure goal <130/80, diabetes with hemoglobin A1c goal below 6.5% and lipids with LDL cholesterol goal below 70 mg/dL.  - follow up in 3 months

## 2015-04-15 NOTE — Progress Notes (Signed)
Patient here for HFU.  Patient denies any stoke symptoms since being released from the hospital two sundays ago. Patient denies pain at this time.  Patient saw neurologist this morning.  Patient has lost 6 lbs since the beginning of the month. Patient is working on controlling her diet and weight.

## 2015-04-15 NOTE — Progress Notes (Signed)
Patient ID: Ruth Conley, female   DOB: February 11, 1966, 49 y.o.   MRN: [id number]  CC: HFU  HPI: Ruth Conley is a 49 y.o. female here today for a follow up visit.  Patient has past medical history of HTN, TIA (08/2014), and Stroke 03/2015. Patient was recently admitted last month for symptoms of left sided weakness and numbness. At that time she had a full stroke workup and was discharged with Plavix and Metoprolol. She was seen by Neurology this morning and has been recommended to have a sleep study. She states that she has been working to losing weight and controlling her diet to help her cholesterol. She reports resolution of left sided weakness and numbness.  No Known Allergies Past Medical History  Diagnosis Date  . Hypertension   . Gestational diabetes   . TIA (transient ischemic attack)   . Stroke [facility])     TIA   Current Outpatient Prescriptions on File Prior to Visit  Medication Sig Dispense Refill  . atorvastatin (LIPITOR) 10 MG tablet Take 1 tablet (10 mg total) by mouth daily at 6 PM. 60 tablet 0  . clopidogrel (PLAVIX) 75 MG tablet Take 1 tablet (75 mg total) by mouth daily. 60 tablet 0  . Coenzyme Q-10 100 MG capsule Take 100 mg by mouth daily.    . ferrous sulfate 325 (65 FE) MG tablet Take 1 tablet (325 mg total) by mouth daily with breakfast. 30 tablet 3  . metoprolol succinate (TOPROL-XL) 25 MG 24 hr tablet Take 1 tablet (25 mg total) by mouth daily. 30 tablet 3  . Omega 3 1200 MG CAPS Take 600 mg by mouth daily.     No current facility-administered medications on file prior to visit.   No family history on file. Social History   Social History  . Marital Status: Legally Separated    Spouse Name: N/A  . Number of Children: N/A  . Years of Education: N/A   Occupational History  . Not on file.   Social History Main Topics  . Smoking status: Never Smoker   . Smokeless tobacco: Not on file  . Alcohol Use: No  . Drug Use: No  . Sexual Activity: Not on file    Other Topics Concern  . Not on file   Social History Narrative    Review of Systems: Constitutional: Negative for fever, chills, diaphoresis, activity change, appetite change and fatigue. HENT: Negative for ear pain, nosebleeds, congestion, facial swelling, rhinorrhea, neck pain, neck stiffness and ear discharge.  Eyes: Negative for pain, discharge, redness, itching and visual disturbance. Respiratory: Negative for cough, choking, chest tightness, shortness of breath, wheezing and stridor.  Cardiovascular: Negative for chest pain, palpitations and leg swelling. Gastrointestinal: Negative for abdominal distention. Genitourinary: Negative for dysuria, urgency, frequency, hematuria, flank pain, decreased urine volume, difficulty urinating and dyspareunia.  Musculoskeletal: Negative for back pain, joint swelling, arthralgias and gait problem. Neurological: Negative for dizziness, tremors, seizures, syncope, facial asymmetry, speech difficulty, weakness, light-headedness, numbness and headaches.  Hematological: Negative for adenopathy. Does not bruise/bleed easily. Psychiatric/Behavioral: Negative for hallucinations, behavioral problems, confusion, dysphoric mood, decreased concentration and agitation.    Objective:   Filed Vitals:   04/15/15 1144  BP: 136/89  Pulse: 87  Temp: 97.3 F (36.3 C)  Resp: 18    Physical Exam  Constitutional: She is oriented to person, place, and time.  Cardiovascular: Normal rate, regular rhythm and normal heart sounds.   Pulmonary/Chest: Effort normal and breath sounds normal.  Neurological: She  is alert and oriented to person, place, and time. No cranial nerve deficit.  Skin: Skin is warm and dry.  Psychiatric: She has a normal mood and affect.     Lab Results  Component Value Date   WBC 7.2 04/05/2015   HGB 10.9* 04/05/2015   HCT 33.6* 04/05/2015   MCV 83.4 04/05/2015   PLT 349 04/05/2015   Lab Results  Component Value Date    CREATININE 0.79 04/05/2015   BUN 8 04/05/2015   NA 137 04/05/2015   K 3.5 04/05/2015   CL 106 04/05/2015   CO2 24 04/05/2015    Lab Results  Component Value Date   HGBA1C 5.6 04/06/2015   Lipid Panel     Component Value Date/Time   CHOL 187 04/06/2015 0250   TRIG 34 04/06/2015 0250   HDL 61 04/06/2015 0250   CHOLHDL 3.1 04/06/2015 0250   VLDL 7 04/06/2015 0250   LDLCALC 119* 04/06/2015 0250       Assessment and plan:   Robertha was seen today for hospitalization follow-up.  Diagnoses and all orders for this visit:  History of TIA (transient ischemic attack) We have discussed optimal preventative care such as desired weight, LDL <70, BP <130/80.   Hypertension BP is still not at goal. I will have her come back in 2 weeks for a recheck, if not at goal we will make adjustments at that time.     Return in about 2 weeks (around 04/29/2015) for Nurse -BP check and 3 mo PCP HTN/lipid panel.   Lance Bosch, [facility] [phone] 04/15/2015, 12:02 PM

## 2015-04-20 ENCOUNTER — Encounter: Payer: Self-pay | Admitting: *Deleted

## 2015-04-20 ENCOUNTER — Ambulatory Visit: Payer: No Typology Code available for payment source

## 2015-04-20 NOTE — Progress Notes (Signed)
Patient ID: Ruth Conley, female   DOB: Apr 10, 1966, 49 y.o.   MRN: [id number] Patient arrived for 04/20/2015 appointment to have a cardiac event monitor applied. Explained to patient that [facility] card would not cover services outside of [place].  Event monitor services are through an outside company.   Patient given paperwork to apply for the hardship program offered through [employer].   Patient will be enrolled for consideration once her paperwork has been filled out and returned to our office.

## 2015-04-24 ENCOUNTER — Encounter: Payer: Self-pay | Admitting: Obstetrics & Gynecology

## 2015-04-24 ENCOUNTER — Ambulatory Visit (INDEPENDENT_AMBULATORY_CARE_PROVIDER_SITE_OTHER): Payer: Self-pay | Admitting: Obstetrics & Gynecology

## 2015-04-24 VITALS — BP 127/80 | HR 101 | Temp 98.7°F | Wt 212.1 lb

## 2015-04-24 DIAGNOSIS — D500 Iron deficiency anemia secondary to blood loss (chronic): Secondary | ICD-10-CM

## 2015-04-24 DIAGNOSIS — N920 Excessive and frequent menstruation with regular cycle: Secondary | ICD-10-CM

## 2015-04-24 DIAGNOSIS — D259 Leiomyoma of uterus, unspecified: Secondary | ICD-10-CM | POA: Insufficient documentation

## 2015-04-24 NOTE — Progress Notes (Signed)
   Subjective:    Patient ID: Ruth Conley, female    DOB: 1965/05/04, 49 y.o.   MRN: [id number]  HPI 49 yo DAAP2 (56 and 53 yo kids) here today with the issue of heavy periods since she got her IUD removed about 5 years, but much worse in the last few months. Her hbg was normal with the IUD but now is 10. She is taking iron pills.  She had a stroke about 3 weeks, no obvious deficits.  She has been abstinent for about 6 months.   Review of Systems Normal pap 5/16 Mammogram normal spring 2016 Flu vaccine 10/16    Objective:   Physical Exam WNWHBFNAD Breathing, conversing, and ambulating normally Mildly obese abd, benign Bimanual - 12-14 week mobile, NT uterus       Assessment & Plan:  Menorrhagia, fibroids, anemia- u/s ordered Continue Iron She will fill out paperwork for the free Mirena

## 2015-04-30 ENCOUNTER — Ambulatory Visit: Payer: No Typology Code available for payment source | Attending: Internal Medicine | Admitting: Pharmacist

## 2015-04-30 VITALS — BP 132/88 | HR 110

## 2015-04-30 DIAGNOSIS — Z79899 Other long term (current) drug therapy: Secondary | ICD-10-CM | POA: Insufficient documentation

## 2015-04-30 DIAGNOSIS — I10 Essential (primary) hypertension: Secondary | ICD-10-CM | POA: Insufficient documentation

## 2015-04-30 MED ORDER — METOPROLOL SUCCINATE ER 50 MG PO TB24: 50.0000 mg | ORAL_TABLET | Freq: Every day | ORAL | Status: DC

## 2015-04-30 NOTE — Progress Notes (Signed)
S:    Patient arrives in good spirits.    Presents to the clinic for hypertension evaluation.   Patient reports adherence with medications.  Current BP Medications include:  Metoprolol succinate 25 mg daily  Patient has been on lisinopril in the past but it caused a cough.   O:   Last 3 Office BP readings: BP Readings from Last 3 Encounters:  04/30/15 143/96  04/24/15 127/80  04/15/15 120/90    BMET    Component Value Date/Time   NA 137 04/05/2015 2259   K 3.5 04/05/2015 2259   CL 106 04/05/2015 2259   CO2 24 04/05/2015 2259   GLUCOSE 133* 04/05/2015 2259   BUN 8 04/05/2015 2259   CREATININE 0.79 04/05/2015 2259   CREATININE 0.71 02/02/2015 1152   CALCIUM 8.8* 04/05/2015 2259   GFRNONAA >60 04/05/2015 2259   GFRAA >60 04/05/2015 2259    A/P: History of hypertension currently UNcontrolled on current medications.  Goal per neurology is <130/80, so will increase metoprolol succinate to 50 mg daily to hopefully lower her blood pressure to goal and to get her pulse <100. Patient to return in 1-2 weeks for a blood pressure and pulse check. Consider ARB in the future as she has a compelling indication (stroke history) and had a cough with ACEi.  Results reviewed and written information provided.   Total time in face-to-face counseling 20 minutes.   F/U Clinic Visit with me in 1-2 weeks.

## 2015-04-30 NOTE — Patient Instructions (Addendum)
Thanks for coming to see me today!  Start metoprolol 50 mg daily for your blood pressure and heart rate  Come back and see me in 1-2 weeks for a blood pressure and pulse check

## 2015-05-01 ENCOUNTER — Ambulatory Visit (HOSPITAL_COMMUNITY)
Admission: RE | Admit: 2015-05-01 | Discharge: 2015-05-01 | Disposition: A | Discharge status: Home / Self Care | Payer: Self-pay | Source: Ambulatory Visit | Attending: Obstetrics & Gynecology | Admitting: Obstetrics & Gynecology

## 2015-05-01 DIAGNOSIS — D649 Anemia, unspecified: Secondary | ICD-10-CM | POA: Insufficient documentation

## 2015-05-01 DIAGNOSIS — D251 Intramural leiomyoma of uterus: Secondary | ICD-10-CM | POA: Insufficient documentation

## 2015-05-01 DIAGNOSIS — D250 Submucous leiomyoma of uterus: Secondary | ICD-10-CM | POA: Insufficient documentation

## 2015-05-01 DIAGNOSIS — N920 Excessive and frequent menstruation with regular cycle: Secondary | ICD-10-CM | POA: Insufficient documentation

## 2015-05-07 ENCOUNTER — Ambulatory Visit: Payer: No Typology Code available for payment source | Attending: Internal Medicine | Admitting: Pharmacist

## 2015-05-07 VITALS — BP 116/78 | HR 88

## 2015-05-07 DIAGNOSIS — Z79899 Other long term (current) drug therapy: Secondary | ICD-10-CM | POA: Insufficient documentation

## 2015-05-07 DIAGNOSIS — I10 Essential (primary) hypertension: Secondary | ICD-10-CM | POA: Insufficient documentation

## 2015-05-07 MED ORDER — LOSARTAN POTASSIUM 25 MG PO TABS: 25.0000 mg | ORAL_TABLET | Freq: Every day | ORAL | Status: DC

## 2015-05-07 NOTE — Progress Notes (Addendum)
S:    Patient arrives in good spirits. Presents to the clinic for hypertension evaluation.   Patient reports adherence with medications. She reports that she took her medications about 1 hour ago.  Current BP Medications include:  Metoprolol succinate 50 mg daily  Patient has been on lisinopril in the past but it caused a cough.   O:   Last 3 Office BP readings: BP Readings from Last 3 Encounters:  05/07/15 116/78  04/30/15 132/88  04/24/15 127/80    BMET    Component Value Date/Time   NA 137 04/05/2015 2259   K 3.5 04/05/2015 2259   CL 106 04/05/2015 2259   CO2 24 04/05/2015 2259   GLUCOSE 133* 04/05/2015 2259   BUN 8 04/05/2015 2259   CREATININE 0.79 04/05/2015 2259   CREATININE 0.71 02/02/2015 1152   CALCIUM 8.8* 04/05/2015 2259   GFRNONAA >60 04/05/2015 2259   GFRAA >60 04/05/2015 2259    A/P: History of hypertension currently controlled on current medications.  Goal per neurology is <130/80, so continue metoprolol succinate to 50 mg daily. Reviewed blood pressure goals and the complications of elevated blood pressure. Patient has ordered a home blood pressure monitor and will return to clinic if she has blood pressure values consistently >130/80 or pulses >100. Patient verbalized understanding.  Results reviewed and written information provided.   Total time in face-to-face counseling 20 minutes.  F/U Clinic Visit with me as needed.

## 2015-05-07 NOTE — Patient Instructions (Signed)
Thanks for coming to see me today!  Your blood pressure and pulse were great!  Monitor your blood pressure and pulse at home.  Come back and see me if you consistently see blood pressures >130/80 or heart rates (pulses) >100

## 2015-05-11 ENCOUNTER — Telehealth: Payer: Self-pay

## 2015-05-11 NOTE — Telephone Encounter (Signed)
Left VM on cell per DPR, advising pt that [facility] will be closed on 05/12/15 until 10am, and her appt will need to be rescheduled. Asked pt to call back on Tuesday after 10 am to reschedule.

## 2015-05-12 ENCOUNTER — Institutional Professional Consult (permissible substitution): Payer: Self-pay | Admitting: Neurology

## 2015-05-14 ENCOUNTER — Ambulatory Visit (INDEPENDENT_AMBULATORY_CARE_PROVIDER_SITE_OTHER): Payer: Self-pay | Admitting: Neurology

## 2015-05-14 ENCOUNTER — Encounter: Payer: Self-pay | Admitting: Neurology

## 2015-05-14 VITALS — BP 128/86 | HR 88 | Resp 20 | Ht 67.0 in | Wt 209.0 lb

## 2015-05-14 DIAGNOSIS — R0683 Snoring: Secondary | ICD-10-CM

## 2015-05-14 DIAGNOSIS — I10 Essential (primary) hypertension: Secondary | ICD-10-CM

## 2015-05-14 DIAGNOSIS — I6359 Cerebral infarction due to unspecified occlusion or stenosis of other cerebral artery: Secondary | ICD-10-CM

## 2015-05-14 NOTE — Patient Instructions (Signed)

## 2015-05-14 NOTE — Progress Notes (Signed)
STROKE NEUROLOGY FOLLOW UP NOTE  NAME: Ruth Conley DOB: 28-Sep-1965  REASON FOR VISIT: sleep study preparation HISTORY FROM: pt and chart  Today we had the pleasure of seeing Ruth Conley in consult at our Neurology Sleep Clinic. Pt was accompanied by no one.   History Summary  Ruth Conley is a 50 y.o. female with history of HTN, elevated LDL, TIA 08/2014 (right sided symptoms) admitted on 04/05/15 for left sided tingling and left leg weakness.  She did not receive IV t-PA due to lack of objective neurologic exam on arrival. MRI showed right thalamic small infarct. Other stroke work up including MRA,head and neck, 2D echo and A1C and hypercoagulable and autoimmune work up were all negative except LDL 119. Her ASA was changed to plavix, and lipitor 10 was added. Pt admitted palpitation once a month and her HR during admission up to 130s, lisinopril changed to metoprolol. She was discharged in good condition with recommendation of 30 day monitoring as outpt.   Of note, pt had transient right sided numbness in 08/2014, was admitted to [facility] for TIA work up. All work up were normal except CUS showed right ICA 40-59% stenosis and it is asymptomatic side. She was put on ASA at that time.   Interval History and sleep consult: 05/14/2015,  Ruth Conley has noted that she tends to snore and seems to wake up more frequently if she gained weight. Overall her sleep evaluation has come about because of a recent TIA and stroke. She was found to have a small right-sided thalamic infarction by MRA diagnosed on 04-05-15 when she presented with left body tingling and left  leg weakness. Some nights she reports having difficulties finding the right sleep position and she may have some tension at the neck which could provoke in return some headaches. Overall she feels that her sleep is good of normal quality restorative or refreshing. She has not been witnessed to have apneas and she has not woken up  gasping for air.  Her sleep habits are as follows her bedtime is usually between 10 and 11 PM and most nights she will sleep promptly. The bedroom is described as cool, quiet and dark. She sleeps alone. He sleeps on several pillows, prefers to sleep on her side. She has multiple bathroom breaks at night but she hydrate throughout the day and evening. She rises usually at 9 AM when she wakes up spontaneously. She will get about 9 hours of sleep at night. Again she feels that she is refreshed and restored in the morning. Not excessively daytime sleepy or fatigued and endorsed the Epworth score at 6 points and the fatigue severity score at 18 points. She does not appear depressed.  Caffeine - none , drinks all day water. No ETOH, SODA s and no tobacco.      REVIEW OF SYSTEMS: Full 14 system review of systems performed and notable only for those listed below and in HPI above, all others are negative:   Constitutional:  Cardiovascular:  Ear/Nose/Throat:  Skin:  Eyes:  Respiratory:  Gastroitestinal:  Genitourinary:  Hematology/Lymphatic:  Endocrine:  Musculoskeletal:  Allergy/Immunology:  Neurological:  Psychiatric:  Sleep:   The following represents the patient's updated allergies and side effects list:  No Known Allergies   The neurologically relevant items on the patient's problem list were reviewed on today's visit.   Neurologic Examination  A problem focused neurological exam (12 or more points of the single system neurologic examination, vital signs counts as  1 point, cranial nerves count for 8 points) was performed.  Last menstrual period 04/04/2015.  The patient has a neck circumference of 14 inches, Mallampati grade 2 and mild retrognathia. She does not present with any lymph node abnormalities, goiter, no sign of bruxism and no TMJ click. She has an unrestricted airflow through the nose. No nasal septal deviation is noted. There is no hearing loss. There is no loss of taste  or smell. General - , well developed, in no apparent distress.  Ophthalmologic - Sharp disc margins OU.  Cardiovascular - Regular rate and rhythm.  Mental Status -  Level of arousal and orientation to time, place, and person were intact.  Language including expression, naming, repetition, comprehension was assessed and found intact.  Attention span and concentration were normal.  Recent and remote memory were intact.  Fund of Knowledge was assessed and was intact.  Cranial Nerves II - XII -  II - Visual field intact OU.  III, IV, VI - Extraocular movements intact.  V - Facial sensation intact bilaterally.  VII - Facial movement intact bilaterally.  VIII - Hearing & vestibular intact bilaterally.  X - Palate elevates symmetrically.  XI - Chin turning & shoulder shrug intact bilaterally.  XII - Tongue protrusion intact.  Motor Strength - The patient's strength was normal in all extremities and pronator drift was absent. Bulk was normal and fasciculations were absent.  Motor Tone - Muscle tone was assessed at the neck and appendages and was normal.  Reflexes - The patient's reflexes were 1+ in all extremities and she had no pathological reflexes.  Sensory - Light touch, temperature/pinprick, vibration and proprioception, and Romberg testing were assessed and were normal.  Coordination - The patient had normal movements in the hands and feet with no ataxia or dysmetria. Tremor was absent.  Gait and Station - The patient's transfers, posture, gait, station, and turns were observed as normal.  Sometimes she noted a little buckle in her left leg, but no foot drop is noted.   Data reviewed:  I personally reviewed the images and agree with the radiology interpretations.   CUS 08/31/14 - - The vertebral arteries appear patent with antegrade flow.  - Findings consistent with 40 - 59 percent stenosis involving the  right internal carotid artery, based on velocities. There is no  plaque morphology  to support stenosis.  - Findings consistent with 1- 39 percent stenosis involving the  left internal carotid artery.  - ICA/CCA ratio. right = 1.64. left = 1.15   08/30/14 MRI and MRA  MRA head: Normal.  MRI head: No acute or significant finding. Few tiny white matter foci and not likely of clinical relevance.   Ct Head Wo Contrast  04/05/2015 No acute intracranial pathology.   Mr Angiogram Neck Wo Contrast  04/06/2015 Normal noncontrast MRA of the neck. Given patient's history of RIGHT carotid stenosis, consider follow-up examination with contrast versus carotid ultrasound if contraindicated.   Mr Brain Wo Contrast  04/06/2015 Subcentimeter acute ischemia RIGHT thalamus. Stable mild white matter changes most compatible chronic small vessel ischemic disease.   Mr Jodene Nam Head/brain Wo Cm  04/06/2015 Normal MRA head.   2D echo - - Left ventricle: The cavity size was normal. Wall thickness was normal. Systolic function was normal. The estimated ejection fraction was in the range of 60% to 65%. Wall motion was normal; there were no regional wall motion abnormalities. Doppler parameters are consistent with abnormal left ventricular relaxation (grade 1 diastolic dysfunction). -  Aortic valve: There was no stenosis. - Mitral valve: There was no significant regurgitation. - Right ventricle: The cavity size was normal. Systolic function was normal. - Pulmonary arteries: No complete TR doppler jet so unable to estimate PA systolic pressure. - Inferior vena cava: The vessel was normal in size. The respirophasic diameter changes were in the normal range (>= 50%), consistent with normal central venous pressure. - Pericardium, extracardiac: A trivial pericardial effusion was identified posterior to the heart. Impressions: - Normal LV size with EF 60-65%. Normal RV size and systolic function. No significant valvular abnormalities.   Component Latest Ref Rng  08/30/2014  04/03/2015  04/06/2015     Cholesterol 0 - 200 mg/dL   181  187   Triglycerides <150 mg/dL   63  34   HDL Cholesterol >40 mg/dL   59  61   Total CHOL/HDL Ratio   3.1  3.1   VLDL 0 - 40 mg/dL   13  7   LDL (calc) 0 - 99 mg/dL   109  119 (H)   C-ANCA Neg:<1:20 titer    <1:20   P-ANCA Neg:<1:20 titer    <1:20   Atypical P-ANCA titer Neg:<1:20 titer    <1:20   PTT Lupus Anticoagulant 0.0 - 40.6 sec    35.3   DRVVT 0.0 - 44.0 sec    36.3   Lupus Anticoag Interp    Comment:   Beta-2 Glyco I IgG 0 - 20 GPI IgG units    <9   Beta-2-Glycoprotein I IgM 0 - 32 GPI IgM units    <9   Beta-2-Glycoprotein I IgA 0 - 25 GPI IgA units    <9   Anticardiolipin IgG 0 - 14 GPL U/mL    <9   Anticardiolipin IgM 0 - 12 MPL U/mL    <9   Anticardiolipin IgA 0 - 11 APL U/mL    <9   Hemoglobin A1C 4.8 - 5.6 %  6.0 (H)   5.6   Mean Plasma Glucose  126   114   Recommendations-F5LEID:    Comment   Comment    Comment   Recommendations-PTGENE:    Comment   Additional Information    Comment   TSH 0.350 - 4.500 uIU/mL  1.456     ds DNA Ab 0 - 9 IU/mL    <1   SSA (Ro) (ENA) Antibody, IgG 0.0 - 0.9 AI    <0.2   SSB (La) (ENA) Antibody, IgG 0.0 - 0.9 AI    <0.2   RPR Non Reactive    Non Reactive   Sed Rate 0 - 22 mm/hr    12   HIV Non Reactive    Non Reactive   Compl, Total (CH50) 42 - 60 U/mL    > 60   ANA Ab, IFA    Negative   C3 Complement 82 - 167 mg/dL    122   Complement C4, Body Fluid 14 - 44 mg/dL    33   Antithrombin Activity 75 - 120 %    95   Protein C Activity 73 - 180 %    91   Protein C, Total 60 - 150 %    85   Protein S Activity 63 - 140 %    83   Protein S Ag, Total 60 - 150 %    96   Homocysteine 0.0 - 15.0 umol/L    4.9   Rhuematoid fact SerPl-aCnc 0.0 -  13.9 IU/mL    <10.0   Sickle Cell Screen Negative    Negative    Assessment: As you may recall, she is a 50 y.o. African American female with PMH of HTN and HLD was admitted in 08/2014 for TIA of transient right sided  numbness. Work up negative and put on ASA. Admitted again in 04/2015 for left sided numbness, MRI showed right thalamic small infarct. Other stroke work up including MRA,head and neck, 2D echo and A1C and hypercoagulable and autoimmune work up were all negative except LDL 119. Her ASA was changed to plavix, and lipitor 10 was added.   Mrs. Peary has noted that she tends to snore when she gained weight. She sleeps alone and therefore has no direct witness to her habits and actions during sleep. She does wake up with a dry mouth which would support that she snores and obstructive sleep apnea is a known risk factor for stroke. For this reason we will order a split night polysomnography for this patient. I think Dr. Erlinda Hong for allowing me to participate in her care, and I will direct: Copies of today's note and the sleep test results to Chari Manning, PCP. Plan:  - testing for OSA  - continue plavix and lipitor for stroke prevention - check BP at home and recorder and bring over to PCP for medication adjustment if needed - 30 day cardiac monitoring for evaluation of palpitation. At this time not insurance approved yet  - sleep study to rule out OSA - tension headache managing with relaxation and PRN tylenol - healthy diet and regular exercise - Follow up with your primary care physician for stroke risk factor modification. Recommend maintain blood pressure goal <130/80, diabetes with hemoglobin A1c goal below 6.5% and lipids with LDL cholesterol goal below 70 mg/dL.  - follow up after sleep study in about 4 weeks.   I spent more than 35 minutes of face to face time with the patient. Greater than 50% of time was spent in counseling and coordination of care. We have discussed about further stroke work up and stroke risk factor modification as well as treatment of tension headache.  [place], [facility] [address] [place], [place] [address] [phone]

## 2015-05-20 ENCOUNTER — Encounter: Payer: Self-pay | Admitting: Obstetrics & Gynecology

## 2015-05-20 ENCOUNTER — Ambulatory Visit (INDEPENDENT_AMBULATORY_CARE_PROVIDER_SITE_OTHER): Payer: Self-pay | Admitting: Obstetrics & Gynecology

## 2015-05-20 VITALS — BP 128/92 | HR 89 | Temp 98.5°F | Ht 68.0 in | Wt 205.7 lb

## 2015-05-20 DIAGNOSIS — N920 Excessive and frequent menstruation with regular cycle: Secondary | ICD-10-CM

## 2015-05-20 DIAGNOSIS — D500 Iron deficiency anemia secondary to blood loss (chronic): Secondary | ICD-10-CM

## 2015-05-20 DIAGNOSIS — D259 Leiomyoma of uterus, unspecified: Secondary | ICD-10-CM

## 2015-05-20 DIAGNOSIS — D219 Benign neoplasm of connective and other soft tissue, unspecified: Secondary | ICD-10-CM | POA: Insufficient documentation

## 2015-05-20 NOTE — Progress Notes (Signed)
   Subjective:    Patient ID: Ruth Conley, female    DOB: 1965-09-06, 50 y.o.   MRN: [id number]  HPI  This lovely 50 yo lady is here to discuss her u/s results. She has anemia, menorrhagia and her physical exam was c/w fibroids.  Review of Systems     Objective:   Physical Exam  WNWHBFNAD Breathing, conversing, and ambulating normally U/s showed multiple uterine fibroids      Assessment & Plan:  Menorrhagia, anemia, fibroids- She is awaiting her ARCH Mirena. I also offered her a TAH/BS. She will wait for the Mirena at this time.

## 2015-05-20 NOTE — Progress Notes (Signed)
[place] and was informed that the pt's application is still in process.  So we will have to continue to wait for pt's approval for the Free Mirena.  Pt notified.

## 2015-05-25 ENCOUNTER — Telehealth: Payer: Self-pay

## 2015-05-25 ENCOUNTER — Telehealth: Payer: Self-pay | Admitting: Internal Medicine

## 2015-05-25 DIAGNOSIS — Z0000 Encounter for general adult medical examination without abnormal findings: Secondary | ICD-10-CM

## 2015-05-25 NOTE — Telephone Encounter (Signed)
Returned phone call to patient Patient is requesting a referral to the eye doctor Referral placed in epic

## 2015-05-25 NOTE — Telephone Encounter (Signed)
Pt. Called requesting a referral to the optometrists. Please f/u

## 2015-05-29 MED ORDER — ATORVASTATIN CALCIUM 10 MG PO TABS: 10.0000 mg | ORAL_TABLET | Freq: Every day | ORAL | Status: DC

## 2015-05-29 MED ORDER — CLOPIDOGREL BISULFATE 75 MG PO TABS: 75.0000 mg | ORAL_TABLET | Freq: Every day | ORAL | Status: DC

## 2015-05-29 MED FILL — FERROUS SULFATE 325 MG TAB: 325 (65 FE) | 30 days supply | Qty: 30 | Fill #3

## 2015-05-29 MED FILL — ATORVASTATIN 10 MG TABLET: 10 | 30 days supply | Qty: 30 | Fill #0

## 2015-05-29 MED FILL — METOPROLOL SUCC ER 50 MG TA: 50 | 30 days supply | Qty: 30 | Fill #1

## 2015-05-29 MED FILL — CLOPIDOGREL 75 MG TABLET: 75 | 30 days supply | Qty: 30 | Fill #0

## 2015-05-29 NOTE — Telephone Encounter (Signed)
Pt. Called requesting a medication refill on the following medications:  atorvastatin (LIPITOR) 10 MG tablet clopidogrel (PLAVIX) 75 MG tablet  Please f/u

## 2015-05-31 ENCOUNTER — Ambulatory Visit (INDEPENDENT_AMBULATORY_CARE_PROVIDER_SITE_OTHER): Payer: Self-pay | Admitting: Neurology

## 2015-05-31 DIAGNOSIS — R0683 Snoring: Secondary | ICD-10-CM

## 2015-05-31 DIAGNOSIS — I10 Essential (primary) hypertension: Secondary | ICD-10-CM

## 2015-05-31 DIAGNOSIS — I6359 Cerebral infarction due to unspecified occlusion or stenosis of other cerebral artery: Secondary | ICD-10-CM

## 2015-05-31 NOTE — Sleep Study (Signed)
Please see the scanned sleep study interpretation located in the Procedure tab within the Chart Review section. 

## 2015-06-02 ENCOUNTER — Telehealth: Payer: Self-pay

## 2015-06-02 NOTE — Telephone Encounter (Signed)
Called pt to discuss sleep study results. No answer, left a message asking her to call me back. 

## 2015-06-03 NOTE — Telephone Encounter (Signed)
Spoke to pt and advised her that her sleep study did not reveal significant sleep apnea or significant periodic limb movements of sleep. I advised her to sleep on her side. I advised her to avoid sedative-hypnotics, alcohol and tobacco. I advised pt to lose weight, diet, and exercise if not contraindicated by other physicians. Pt verbalized understanding.  I offered pt a follow up appt to discuss further, but pt declined and said she would follow up with Dr. Erlinda Hong.

## 2015-06-12 ENCOUNTER — Telehealth: Payer: Self-pay

## 2015-06-12 NOTE — Telephone Encounter (Signed)
Contacted pt in regards to needing her signature on the back page of the [employer].  LM to return call or come the front office to sign the back page of the [employer].

## 2015-06-19 NOTE — Telephone Encounter (Signed)
Pt has not returned call to the clinic.

## 2015-06-26 ENCOUNTER — Other Ambulatory Visit: Payer: Self-pay | Admitting: Internal Medicine

## 2015-06-26 MED FILL — ATORVASTATIN 10 MG TABLET: 10 | 30 days supply | Qty: 30 | Fill #1

## 2015-06-26 MED FILL — FERROUS SULFATE 325 MG TAB: 325 (65 FE) | 30 days supply | Qty: 30 | Fill #0

## 2015-06-26 MED FILL — CLOPIDOGREL 75 MG TABLET: 75 | 30 days supply | Qty: 30 | Fill #1

## 2015-06-26 MED FILL — METOPROLOL SUCC ER 50 MG TA: 50 | 30 days supply | Qty: 30 | Fill #2

## 2015-06-29 ENCOUNTER — Ambulatory Visit: Payer: No Typology Code available for payment source | Attending: Internal Medicine | Admitting: Internal Medicine

## 2015-06-29 ENCOUNTER — Encounter: Payer: Self-pay | Admitting: Internal Medicine

## 2015-06-29 VITALS — BP 141/88 | HR 88 | Temp 98.0°F | Resp 16 | Ht 68.0 in | Wt 199.6 lb

## 2015-06-29 DIAGNOSIS — J309 Allergic rhinitis, unspecified: Secondary | ICD-10-CM

## 2015-06-29 DIAGNOSIS — I10 Essential (primary) hypertension: Secondary | ICD-10-CM | POA: Insufficient documentation

## 2015-06-29 DIAGNOSIS — Z7902 Long term (current) use of antithrombotics/antiplatelets: Secondary | ICD-10-CM | POA: Insufficient documentation

## 2015-06-29 DIAGNOSIS — R040 Epistaxis: Secondary | ICD-10-CM | POA: Insufficient documentation

## 2015-06-29 DIAGNOSIS — Z8673 Personal history of transient ischemic attack (TIA), and cerebral infarction without residual deficits: Secondary | ICD-10-CM | POA: Insufficient documentation

## 2015-06-29 DIAGNOSIS — Z888 Allergy status to other drugs, medicaments and biological substances status: Secondary | ICD-10-CM | POA: Insufficient documentation

## 2015-06-29 DIAGNOSIS — T148XXA Other injury of unspecified body region, initial encounter: Secondary | ICD-10-CM

## 2015-06-29 DIAGNOSIS — T7840XA Allergy, unspecified, initial encounter: Secondary | ICD-10-CM | POA: Insufficient documentation

## 2015-06-29 DIAGNOSIS — T148 Other injury of unspecified body region: Secondary | ICD-10-CM

## 2015-06-29 DIAGNOSIS — Z79899 Other long term (current) drug therapy: Secondary | ICD-10-CM | POA: Insufficient documentation

## 2015-06-29 LAB — CBC WITH DIFFERENTIAL/PLATELET
Basophils Absolute: 0 10*3/uL (ref 0.0–0.1)
Basophils Relative: 0 % (ref 0–1)
Eosinophils Absolute: 0.2 10*3/uL (ref 0.0–0.7)
Eosinophils Relative: 4 % (ref 0–5)
HEMATOCRIT: 34.9 % — AB (ref 36.0–46.0)
HEMOGLOBIN: 11.1 g/dL — AB (ref 12.0–15.0)
LYMPHS ABS: 1.5 10*3/uL (ref 0.7–4.0)
Lymphocytes Relative: 34 % (ref 12–46)
MCH: 26.8 pg (ref 26.0–34.0)
MCHC: 31.8 g/dL (ref 30.0–36.0)
MCV: 84.3 fL (ref 78.0–100.0)
MONO ABS: 0.3 10*3/uL (ref 0.1–1.0)
MONOS PCT: 7 % (ref 3–12)
MPV: 9.9 fL (ref 8.6–12.4)
NEUTROS ABS: 2.5 10*3/uL (ref 1.7–7.7)
NEUTROS PCT: 55 % (ref 43–77)
Platelets: 352 10*3/uL (ref 150–400)
RBC: 4.14 MIL/uL (ref 3.87–5.11)
RDW: 15 % (ref 11.5–15.5)
WBC: 4.5 10*3/uL (ref 4.0–10.5)

## 2015-06-29 MED ORDER — FLUTICASONE PROPIONATE 50 MCG/ACT NA SUSP: 2.0000 | Freq: Every day | NASAL | Status: DC

## 2015-06-29 MED FILL — FLUTICASONE PROP 50 MCG SPR: 50 | 30 days supply | Qty: 16 | Fill #0

## 2015-06-29 NOTE — Progress Notes (Signed)
Patient ID: Ruth Conley, female   DOB: Jun 18, 1965, 50 y.o.   MRN: [id number]  CC: bruising  HPI: Ruth Conley is a 49 y.o. female here today for a follow up visit.  Patient has past medical history of TIA and hypertension. Patient reports that she was placed on Plavix 2 months ago after having a a second TIA. She reports that since she started the Plavix she has noticed symptoms of bruising. She reports that she has only had one small nose bleed and that was over one month ago. She denies symptoms of hematuria or bloody stools.  Patient complains of slight sinus drainage in back of throat that began earlier last week. She has tried Claritin and sudafed without relief.    Allergies  Allergen Reactions  . Lisinopril Cough   Past Medical History  Diagnosis Date  . Hypertension   . Gestational diabetes   . TIA (transient ischemic attack)   . Stroke [facility])     TIA   Current Outpatient Prescriptions on File Prior to Visit  Medication Sig Dispense Refill  . atorvastatin (LIPITOR) 10 MG tablet Take 1 tablet (10 mg total) by mouth daily at 6 PM. 60 tablet 1  . clopidogrel (PLAVIX) 75 MG tablet Take 1 tablet (75 mg total) by mouth daily. 60 tablet 1  . Coenzyme Q-10 100 MG capsule Take 100 mg by mouth daily.    . ferrous sulfate 325 (65 FE) MG tablet TAKE 1 TABLET BY MOUTH DAILY WITH BREAKFAST. 30 tablet 2  . metoprolol succinate (TOPROL-XL) 50 MG 24 hr tablet Take 1 tablet (50 mg total) by mouth daily. Take with or immediately following a meal. 30 tablet 3  . Multiple Vitamins-Minerals (MULTIVITAMIN WITH MINERALS) tablet Take 1 tablet by mouth daily.    . Omega 3 1200 MG CAPS Take 600 mg by mouth daily.     No current facility-administered medications on file prior to visit.   History reviewed. No pertinent family history. Social History   Social History  . Marital Status: Legally Separated    Spouse Name: N/A  . Number of Children: N/A  . Years of Education: N/A   Occupational  History  . Not on file.   Social History Main Topics  . Smoking status: Never Smoker   . Smokeless tobacco: Not on file  . Alcohol Use: No  . Drug Use: No  . Sexual Activity: Not on file   Other Topics Concern  . Not on file   Social History Narrative    Review of Systems: Other than what is stated in HPI, all other systems are negative.   Objective:   Filed Vitals:   06/29/15 0923  BP: 141/88  Pulse: 88  Temp: 98 F (36.7 C)  Resp: 16    Physical Exam  Constitutional: She is oriented to person, place, and time.  Cardiovascular: Normal rate, regular rhythm and normal heart sounds.   Pulmonary/Chest: Effort normal and breath sounds normal.  Neurological: She is alert and oriented to person, place, and time.  Skin: Skin is warm and dry.  Small bruising on left forearm  Psychiatric: She has a normal mood and affect.   Lab Results  Component Value Date   WBC 7.2 04/05/2015   HGB 10.9* 04/05/2015   HCT 33.6* 04/05/2015   MCV 83.4 04/05/2015   PLT 349 04/05/2015   Lab Results  Component Value Date   CREATININE 0.79 04/05/2015   BUN 8 04/05/2015   NA 137 04/05/2015  K 3.5 04/05/2015   CL 106 04/05/2015   CO2 24 04/05/2015    Lab Results  Component Value Date   HGBA1C 5.6 04/06/2015   Lipid Panel     Component Value Date/Time   CHOL 187 04/06/2015 0250   TRIG 34 04/06/2015 0250   HDL 61 04/06/2015 0250   CHOLHDL 3.1 04/06/2015 0250   VLDL 7 04/06/2015 0250   LDLCALC 119* 04/06/2015 0250       Assessment and plan:   Earlyne was seen today for allergic reaction.  Diagnoses and all orders for this visit:  Bruising -     CBC with Differential I have explained that Plavix can cause some bruising. I have explained what to look out for and when to return.  Allergic rhinitis, unspecified allergic rhinitis type -    Begin fluticasone (FLONASE) 50 MCG/ACT nasal spray; Place 2 sprays into both nostrils daily.  Return in about 3 months (around  09/26/2015) for Hypertension.       Lance Bosch, [facility] [phone] 06/29/2015, 9:38 AM

## 2015-06-29 NOTE — Progress Notes (Signed)
Patient here for follow up Patient started plavix in December after her stroke And has been noticing purple spots that have been appearing In various places on her body

## 2015-07-06 ENCOUNTER — Telehealth: Payer: Self-pay

## 2015-07-06 ENCOUNTER — Ambulatory Visit: Payer: No Typology Code available for payment source

## 2015-07-06 NOTE — Telephone Encounter (Signed)
-----   Message from Lance Bosch, NP sent at 07/06/2015 12:49 PM EST ----- Anemia slightly improving. Continue iron

## 2015-07-06 NOTE — Telephone Encounter (Signed)
Tried to contact patient Patient not available Message left on voice mail to return our call 

## 2015-07-09 ENCOUNTER — Telehealth: Payer: Self-pay | Admitting: Internal Medicine

## 2015-07-09 ENCOUNTER — Telehealth: Payer: Self-pay

## 2015-07-09 NOTE — Telephone Encounter (Signed)
Pt. returned call for her results. Please f/u

## 2015-07-09 NOTE — Telephone Encounter (Signed)
Returned patient phone call and she is aware of her lab results 

## 2015-07-20 ENCOUNTER — Ambulatory Visit: Payer: No Typology Code available for payment source

## 2015-07-21 ENCOUNTER — Ambulatory Visit (INDEPENDENT_AMBULATORY_CARE_PROVIDER_SITE_OTHER): Payer: Self-pay | Admitting: Neurology

## 2015-07-21 ENCOUNTER — Encounter: Payer: Self-pay | Admitting: Neurology

## 2015-07-21 VITALS — BP 129/88 | HR 88 | Ht 67.0 in | Wt 202.0 lb

## 2015-07-21 DIAGNOSIS — I63531 Cerebral infarction due to unspecified occlusion or stenosis of right posterior cerebral artery: Secondary | ICD-10-CM | POA: Insufficient documentation

## 2015-07-21 DIAGNOSIS — E785 Hyperlipidemia, unspecified: Secondary | ICD-10-CM

## 2015-07-21 DIAGNOSIS — R002 Palpitations: Secondary | ICD-10-CM

## 2015-07-21 DIAGNOSIS — I10 Essential (primary) hypertension: Secondary | ICD-10-CM

## 2015-07-21 NOTE — Patient Instructions (Signed)
-   continue plavix and lipitor for stroke prevention - check BP at home and recorder and bring over to PCP for medication adjustment if needed - 30 day cardiac monitoring for evaluation of palpitation. Please call the cardiology back and find out.  - healthy diet and regular exercise - Follow up with your primary care physician for stroke risk factor modification. Recommend maintain blood pressure goal <130/80, diabetes with hemoglobin A1c goal below 6.5% and lipids with LDL cholesterol goal below 70 mg/dL.  - follow up in 6 months

## 2015-07-21 NOTE — Progress Notes (Signed)
STROKE NEUROLOGY FOLLOW UP NOTE  NAME: Ruth Conley DOB: 1965-12-20  REASON FOR VISIT: stroke follow up HISTORY FROM: pt and chart  Today we had the pleasure of seeing Ruth Conley in follow-up at our Neurology Clinic. Pt was accompanied by no one.   History Summary  [name] is a 50 y.o. female with history of HTN, elevated LDL, TIA 08/2014 (right sided symptoms) admitted on 04/05/15 for left sided tingling and left leg weakness. She did not receive IV t-PA due to lack of objective neurologic exam on arrival. MRI showed right thalamic small infarct. Other stroke work up including MRA,head and neck, 2D echo and A1C and hypercoagulable and autoimmune work up were all negative except LDL 119. Her ASA was changed to plavix, and lipitor 10 was added. Pt admitted palpitation once a month and her HR during admission up to 130s, lisinopril changed to metoprolol. She was discharged in good condition with recommendation of 30 day monitoring as outpt.   Of note, pt had transient right sided numbness in 08/2014, was admitted to [facility] for TIA work up. All work up were normal except CUS showed right ICA 40-59% stenosis and it is asymptomatic side. She was put on ASA at that time.   04/15/15 follow up - the patient has been doing well. Her left facial mild numbness improved. Left hand numbness feeling is coming back. Left leg wobbly resolved. Reported frontal area mild headache with tension at neck and back of head. Admitted anxiety due to recent stroke. Admitted snoring but no apnea. Denies any smoking, illicit drugs or use OCP. BP today is high in clinic 152/103, but not check BP at home. In doctors office before 130/80. Recently started exercise program  Interval History  During the interval time, pt has been doing well without stroke like symptoms. Reported easy bruise on the skin with plavix. Sleep study done showed no evidence of OSA. 30 day cardiac monitoring not done yet due to  insurance issue. Reported some facial and hand numbness when tired or stressed out. On metoprolol, not having much palpitation recently. BP today 129/88.   REVIEW OF SYSTEMS: Full 14 system review of systems performed and notable only for those listed below and in HPI above, all others are negative:   Constitutional:  Cardiovascular:  Ear/Nose/Throat:  Skin:  Eyes: eye pain Respiratory:  Gastroitestinal:  Genitourinary:  Hematology/Lymphatic: anemia Endocrine:  Musculoskeletal:  Allergy/Immunology:  Neurological:  Psychiatric:  Sleep:   The following represents the patient's updated allergies and side effects list:  No Known Allergies   The neurologically relevant items on the patient's problem list were reviewed on today's visit.   Neurologic Examination  A problem focused neurological exam (12 or more points of the single system neurologic examination, vital signs counts as 1 point, cranial nerves count for 8 points) was performed.  Last menstrual period 04/04/2015.  General - obese, well developed, in no apparent distress.  Ophthalmologic - Sharp disc margins OU.  Cardiovascular - Regular rate and rhythm.  Mental Status -  Level of arousal and orientation to time, place, and person were intact.  Language including expression, naming, repetition, comprehension was assessed and found intact.  Attention span and concentration were normal.  Recent and remote memory were intact.  Fund of Knowledge was assessed and was intact.  Cranial Nerves II - XII -  II - Visual field intact OU.  III, IV, VI - Extraocular movements intact.  V - Facial sensation intact bilaterally.  VII - Facial movement intact bilaterally.  VIII - Hearing & vestibular intact bilaterally.  X - Palate elevates symmetrically.  XI - Chin turning & shoulder shrug intact bilaterally.  XII - Tongue protrusion intact.  Motor Strength - The patient's strength was normal in all extremities and pronator drift was  absent. Bulk was normal and fasciculations were absent.  Motor Tone - Muscle tone was assessed at the neck and appendages and was normal.  Reflexes - The patient's reflexes were 1+ in all extremities and she had no pathological reflexes.  Sensory - Light touch, temperature/pinprick, vibration and proprioception, and Romberg testing were assessed and were normal.  Coordination - The patient had normal movements in the hands and feet with no ataxia or dysmetria. Tremor was absent.  Gait and Station - The patient's transfers, posture, gait, station, and turns were observed as normal.   Data reviewed:  I personally reviewed the images and agree with the radiology interpretations.   CUS 08/31/14 - - The vertebral arteries appear patent with antegrade flow.  - Findings consistent with 40 - 59 percent stenosis involving the  right internal carotid artery, based on velocities. There is no  plaque morphology to support stenosis.  - Findings consistent with 1- 39 percent stenosis involving the  left internal carotid artery.  - ICA/CCA ratio. right = 1.64. left = 1.15   08/30/14 MRI and MRA  MRA head: Normal.  MRI head: No acute or significant finding. Few tiny white matter foci and not likely of clinical relevance.   Ct Head Wo Contrast  04/05/2015 No acute intracranial pathology.   Mr Angiogram Neck Wo Contrast  04/06/2015 Normal noncontrast MRA of the neck. Given patient's history of RIGHT carotid stenosis, consider follow-up examination with contrast versus carotid ultrasound if contraindicated.   Mr Brain Wo Contrast  04/06/2015 Subcentimeter acute ischemia RIGHT thalamus. Stable mild white matter changes most compatible chronic small vessel ischemic disease.   Mr Jodene Nam Head/brain Wo Cm  04/06/2015 Normal MRA head.   2D echo - - Left ventricle: The cavity size was normal. Wall thickness was normal. Systolic function was normal. The estimated ejection fraction was in the range of 60% to 65%. Wall  motion was normal; there were no regional wall motion abnormalities. Doppler parameters are consistent with abnormal left ventricular relaxation (grade 1 diastolic dysfunction). - Aortic valve: There was no stenosis. - Mitral valve: There was no significant regurgitation. - Right ventricle: The cavity size was normal. Systolic function was normal. - Pulmonary arteries: No complete TR doppler jet so unable to estimate PA systolic pressure. - Inferior vena cava: The vessel was normal in size. The respirophasic diameter changes were in the normal range (>= 50%), consistent with normal central venous pressure. - Pericardium, extracardiac: A trivial pericardial effusion was identified posterior to the heart. Impressions: - Normal LV size with EF 60-65%. Normal RV size and systolic function. No significant valvular abnormalities.   Sleep study - no evidence of OSA  Component Latest Ref Rng  08/30/2014  04/03/2015  04/06/2015   Cholesterol 0 - 200 mg/dL   181  187   Triglycerides <150 mg/dL   63  34   HDL Cholesterol >40 mg/dL   59  61   Total CHOL/HDL Ratio   3.1  3.1   VLDL 0 - 40 mg/dL   13  7   LDL (calc) 0 - 99 mg/dL   109  119 (H)   C-ANCA Neg:<1:20 titer    <1:20  P-ANCA Neg:<1:20 titer    <1:20   Atypical P-ANCA titer Neg:<1:20 titer    <1:20   PTT Lupus Anticoagulant 0.0 - 40.6 sec    35.3   DRVVT 0.0 - 44.0 sec    36.3   Lupus Anticoag Interp    Comment:   Beta-2 Glyco I IgG 0 - 20 GPI IgG units    <9   Beta-2-Glycoprotein I IgM 0 - 32 GPI IgM units    <9   Beta-2-Glycoprotein I IgA 0 - 25 GPI IgA units    <9   Anticardiolipin IgG 0 - 14 GPL U/mL    <9   Anticardiolipin IgM 0 - 12 MPL U/mL    <9   Anticardiolipin IgA 0 - 11 APL U/mL    <9   Hemoglobin A1C 4.8 - 5.6 %  6.0 (H)   5.6   Mean Plasma Glucose  126   114   Recommendations-F5LEID:    Comment   Comment    Comment   Recommendations-PTGENE:    Comment   Additional Information    Comment     TSH 0.350 - 4.500 uIU/mL  1.456     ds DNA Ab 0 - 9 IU/mL    <1   SSA (Ro) (ENA) Antibody, IgG 0.0 - 0.9 AI    <0.2   SSB (La) (ENA) Antibody, IgG 0.0 - 0.9 AI    <0.2   RPR Non Reactive    Non Reactive   Sed Rate 0 - 22 mm/hr    12   HIV Non Reactive    Non Reactive   Compl, Total (CH50) 42 - 60 U/mL    > 60   ANA Ab, IFA    Negative   C3 Complement 82 - 167 mg/dL    122   Complement C4, Body Fluid 14 - 44 mg/dL    33   Antithrombin Activity 75 - 120 %    95   Protein C Activity 73 - 180 %    91   Protein C, Total 60 - 150 %    85   Protein S Activity 63 - 140 %    83   Protein S Ag, Total 60 - 150 %    96   Homocysteine 0.0 - 15.0 umol/L    4.9   Rhuematoid fact SerPl-aCnc 0.0 - 13.9 IU/mL    <10.0   Sickle Cell Screen Negative    Negative    Assessment: As you may recall, she is a 50 y.o. African American female with PMH of HTN and HLD was admitted in 08/2014 for TIA of transient right sided numbness. Work up negative and put on ASA. Admitted again in 04/2015 for left sided numbness, MRI showed right thalamic small infarct. Other stroke work up including MRA,head and neck, 2D echo and A1C and hypercoagulable and autoimmune work up were all negative except LDL 119. Her ASA was changed to plavix, and lipitor 10 was added. Pt admitted palpitation once a month and her HR during admission up to 130s, lisinopril changed to metoprolol. She was discharged in good condition. During the interval time, she has been doing well. Had sleepy study showed no evidence of OSA. Still pending for 30 day monitoring.   Plan:  - continue plavix and lipitor for stroke prevention - check BP at home and recorder and bring over to PCP for medication adjustment if needed - 30 day cardiac monitoring for evaluation of palpitation. Please  call the cardiology back and find out.  - healthy diet and regular exercise - Follow up with your primary care physician for stroke risk factor modification.  Recommend maintain blood pressure goal <130/80, diabetes with hemoglobin A1c goal below 6.5% and lipids with LDL cholesterol goal below 70 mg/dL.  - follow up in 6 months  I spent more than 25 minutes of face to face time with the patient. Greater than 50% of time was spent in counseling and coordination of care. We have discussed about further stroke work up and stroke risk factor modification.   No orders of the defined types were placed in this encounter.    No orders of the defined types were placed in this encounter.    There are no Patient Instructions on file for this visit.  Rosalin Hawking, MD PhD [facility] [address], [place], [place] [address] [phone]

## 2015-07-27 ENCOUNTER — Ambulatory Visit: Payer: No Typology Code available for payment source | Attending: Internal Medicine

## 2015-07-27 MED FILL — CLOPIDOGREL 75 MG TABLET: 75 | 30 days supply | Qty: 30 | Fill #2

## 2015-07-27 MED FILL — METOPROLOL SUCC ER 50 MG TA: 50 | 30 days supply | Qty: 30 | Fill #3

## 2015-07-27 MED FILL — ATORVASTATIN 10 MG TABLET: 10 | 30 days supply | Qty: 30 | Fill #2

## 2015-07-27 MED FILL — FERROUS SULFATE 325 MG TAB: 325 (65 FE) | 30 days supply | Qty: 30 | Fill #1

## 2015-08-06 ENCOUNTER — Encounter: Payer: Self-pay | Admitting: Obstetrics & Gynecology

## 2015-08-06 ENCOUNTER — Ambulatory Visit (INDEPENDENT_AMBULATORY_CARE_PROVIDER_SITE_OTHER): Payer: Self-pay | Admitting: Obstetrics & Gynecology

## 2015-08-06 VITALS — BP 137/82 | HR 98 | Temp 99.1°F | Ht 67.0 in | Wt 199.1 lb

## 2015-08-06 DIAGNOSIS — D259 Leiomyoma of uterus, unspecified: Secondary | ICD-10-CM

## 2015-08-06 DIAGNOSIS — N920 Excessive and frequent menstruation with regular cycle: Secondary | ICD-10-CM

## 2015-08-06 DIAGNOSIS — D500 Iron deficiency anemia secondary to blood loss (chronic): Secondary | ICD-10-CM

## 2015-08-06 DIAGNOSIS — Z3202 Encounter for pregnancy test, result negative: Secondary | ICD-10-CM

## 2015-08-06 LAB — POCT PREGNANCY, URINE: Preg Test, Ur: NEGATIVE

## 2015-08-06 NOTE — Progress Notes (Signed)
   Subjective:    Patient ID: Ruth Conley, female    DOB: Feb 11, 1966, 50 y.o.   MRN: [id number]  HPI  50 yo abstinent P2 here for insertion of her ARCH Mirena to help her menorrhagia due to her multiple fibroids.   Review of Systems     Objective:   Physical Exam UPT negative, consent signed, Time out procedure done. Cervix prepped with betadine and grasped with a single tooth tenaculum. Mirena was easily placed and the strings were cut to 3-4 cm. Uterus sounded to 9 cm. She tolerated the procedure well.      Assessment & Plan:  Menorrhagia/fibroids- Mirena RTC 2-3 months for follow up

## 2015-08-28 ENCOUNTER — Other Ambulatory Visit: Payer: Self-pay | Admitting: Internal Medicine

## 2015-08-28 MED FILL — METOPROLOL SUCC ER 50 MG TA: 50 | 30 days supply | Qty: 30 | Fill #0

## 2015-08-28 MED FILL — FERROUS SULFATE 325 MG TAB: 325 (65 FE) | 30 days supply | Qty: 30 | Fill #2

## 2015-08-28 MED FILL — ?ATORVASTATIN 10 MG TABLET: 10 | 30 days supply | Qty: 30 | Fill #3

## 2015-08-28 MED FILL — CLOPIDOGREL 75 MG TABLET: 75 | 30 days supply | Qty: 30 | Fill #3

## 2015-08-28 NOTE — Telephone Encounter (Signed)
Patient called and requested a med refill for metoprolol succinate (TOPROL-XL) 50 MG 24 hr tablet. Please f/u with pt.

## 2015-09-01 ENCOUNTER — Telehealth: Payer: Self-pay

## 2015-09-01 NOTE — Telephone Encounter (Signed)
Pt called and stated that she got a Mirena inserted and having some spotting.  Pt wants to know if that is normal.

## 2015-09-02 NOTE — Telephone Encounter (Signed)
Left message on patient voicemail to call us back concerning spotting after having a Mirena place.

## 2015-09-07 NOTE — Telephone Encounter (Signed)
Called patient and discussed normal irregular bleeding post mirena placement. Patient verbalized understanding & had no questions

## 2015-09-21 ENCOUNTER — Ambulatory Visit: Payer: Self-pay

## 2015-09-22 ENCOUNTER — Other Ambulatory Visit: Payer: Self-pay

## 2015-09-22 DIAGNOSIS — Z1231 Encounter for screening mammogram for malignant neoplasm of breast: Secondary | ICD-10-CM

## 2015-09-22 NOTE — Telephone Encounter (Signed)
Refilled on 08/28/15

## 2015-09-23 ENCOUNTER — Telehealth: Payer: Self-pay | Admitting: Internal Medicine

## 2015-09-23 NOTE — Telephone Encounter (Signed)
Patient needs refill for metoprolol, plavix,

## 2015-09-24 MED ORDER — CLOPIDOGREL BISULFATE 75 MG PO TABS: 75.0000 mg | ORAL_TABLET | Freq: Every day | ORAL | Status: DC

## 2015-09-24 MED ORDER — METOPROLOL SUCCINATE ER 50 MG PO TB24: ORAL_TABLET | ORAL | Status: DC

## 2015-09-24 MED FILL — METOPROLOL SUCC ER 50 MG TA: 50 | 30 days supply | Qty: 30 | Fill #0

## 2015-09-24 MED FILL — CLOPIDOGREL 75 MG TABLET: 75 | 30 days supply | Qty: 30 | Fill #0

## 2015-09-24 NOTE — Telephone Encounter (Signed)
Both medication refilled.

## 2015-09-29 ENCOUNTER — Other Ambulatory Visit: Payer: Self-pay | Admitting: Internal Medicine

## 2015-09-29 MED FILL — ?ATORVASTATIN 10 MG TABLET: 10 | 30 days supply | Qty: 30 | Fill #0

## 2015-10-07 ENCOUNTER — Ambulatory Visit
Admission: RE | Admit: 2015-10-07 | Discharge: 2015-10-07 | Disposition: A | Discharge status: Home / Self Care | Payer: Medicaid Other | Source: Ambulatory Visit

## 2015-10-07 ENCOUNTER — Other Ambulatory Visit: Payer: Self-pay | Admitting: Internal Medicine

## 2015-10-07 DIAGNOSIS — Z1231 Encounter for screening mammogram for malignant neoplasm of breast: Secondary | ICD-10-CM

## 2015-10-26 ENCOUNTER — Ambulatory Visit: Payer: Self-pay | Admitting: Internal Medicine

## 2015-10-27 MED FILL — CLOPIDOGREL 75 MG TABLET: 75 | 30 days supply | Qty: 30 | Fill #1

## 2015-10-27 MED FILL — METOPROLOL SUCC ER 50 MG TA: 50 | 30 days supply | Qty: 30 | Fill #1

## 2015-10-27 MED FILL — ?ATORVASTATIN 10 MG TABLET: 10 | 30 days supply | Qty: 30 | Fill #1

## 2015-11-10 ENCOUNTER — Encounter: Payer: Self-pay | Admitting: Internal Medicine

## 2015-11-10 ENCOUNTER — Ambulatory Visit: Payer: Self-pay | Attending: Internal Medicine | Admitting: Internal Medicine

## 2015-11-10 VITALS — BP 146/83 | HR 90 | Temp 98.6°F | Resp 16 | Wt 206.8 lb

## 2015-11-10 DIAGNOSIS — I6381 Other cerebral infarction due to occlusion or stenosis of small artery: Secondary | ICD-10-CM

## 2015-11-10 DIAGNOSIS — I10 Essential (primary) hypertension: Secondary | ICD-10-CM | POA: Insufficient documentation

## 2015-11-10 DIAGNOSIS — D649 Anemia, unspecified: Secondary | ICD-10-CM | POA: Insufficient documentation

## 2015-11-10 DIAGNOSIS — N920 Excessive and frequent menstruation with regular cycle: Secondary | ICD-10-CM | POA: Insufficient documentation

## 2015-11-10 DIAGNOSIS — Z79899 Other long term (current) drug therapy: Secondary | ICD-10-CM | POA: Insufficient documentation

## 2015-11-10 DIAGNOSIS — Z8632 Personal history of gestational diabetes: Secondary | ICD-10-CM | POA: Insufficient documentation

## 2015-11-10 DIAGNOSIS — I639 Cerebral infarction, unspecified: Secondary | ICD-10-CM

## 2015-11-10 DIAGNOSIS — Z8673 Personal history of transient ischemic attack (TIA), and cerebral infarction without residual deficits: Secondary | ICD-10-CM | POA: Insufficient documentation

## 2015-11-10 LAB — CBC WITH DIFFERENTIAL/PLATELET
BASOS ABS: 0 {cells}/uL (ref 0–200)
Basophils Relative: 0 %
EOS ABS: 84 {cells}/uL (ref 15–500)
EOS PCT: 1 %
HCT: 35.8 % (ref 35.0–45.0)
HEMOGLOBIN: 11.8 g/dL (ref 11.7–15.5)
Lymphocytes Relative: 14 %
Lymphs Abs: 1176 cells/uL (ref 850–3900)
MCH: 27.9 pg (ref 27.0–33.0)
MCHC: 33 g/dL (ref 32.0–36.0)
MCV: 84.6 fL (ref 80.0–100.0)
MONOS PCT: 8 %
MPV: 9.7 fL (ref 7.5–12.5)
Monocytes Absolute: 672 cells/uL (ref 200–950)
NEUTROS PCT: 77 %
Neutro Abs: 6468 cells/uL (ref 1500–7800)
PLATELETS: 272 10*3/uL (ref 140–400)
RBC: 4.23 MIL/uL (ref 3.80–5.10)
RDW: 15 % (ref 11.0–15.0)
WBC: 8.4 10*3/uL (ref 3.8–10.8)

## 2015-11-10 MED ORDER — HYDROCHLOROTHIAZIDE 12.5 MG PO CAPS: 12.5000 mg | ORAL_CAPSULE | Freq: Every day | ORAL | Status: DC

## 2015-11-10 NOTE — Patient Instructions (Signed)
DASH Eating Plan  DASH stands for "Dietary Approaches to Stop Hypertension." The DASH eating plan is a healthy eating plan that has been shown to reduce high blood pressure (hypertension). Additional health benefits may include reducing the risk of type 2 diabetes mellitus, heart disease, and stroke. The DASH eating plan may also help with weight loss.  WHAT DO I NEED TO KNOW ABOUT THE DASH EATING PLAN?  For the DASH eating plan, you will follow these general guidelines:  · Choose foods with a percent daily value for sodium of less than 5% (as listed on the food label).  · Use salt-free seasonings or herbs instead of table salt or sea salt.  · Check with your health care provider or pharmacist before using salt substitutes.  · Eat lower-sodium products, often labeled as "lower sodium" or "no salt added."  · Eat fresh foods.  · Eat more vegetables, fruits, and low-fat dairy products.  · Choose whole grains. Look for the word "whole" as the first word in the ingredient list.  · Choose fish and skinless chicken or turkey more often than red meat. Limit fish, poultry, and meat to 6 oz (170 g) each day.  · Limit sweets, desserts, sugars, and sugary drinks.  · Choose heart-healthy fats.  · Limit cheese to 1 oz (28 g) per day.  · Eat more home-cooked food and less restaurant, buffet, and fast food.  · Limit fried foods.  · Cook foods using methods other than frying.  · Limit canned vegetables. If you do use them, rinse them well to decrease the sodium.  · When eating at a restaurant, ask that your food be prepared with less salt, or no salt if possible.  WHAT FOODS CAN I EAT?  Seek help from a dietitian for individual calorie needs.  Grains  Whole grain or whole wheat bread. Brown rice. Whole grain or whole wheat pasta. Quinoa, bulgur, and whole grain cereals. Low-sodium cereals. Corn or whole wheat flour tortillas. Whole grain cornbread. Whole grain crackers. Low-sodium crackers.  Vegetables  Fresh or frozen vegetables  (raw, steamed, roasted, or grilled). Low-sodium or reduced-sodium tomato and vegetable juices. Low-sodium or reduced-sodium tomato sauce and paste. Low-sodium or reduced-sodium canned vegetables.   Fruits  All fresh, canned (in natural juice), or frozen fruits.  Meat and Other Protein Products  Ground beef (85% or leaner), grass-fed beef, or beef trimmed of fat. Skinless chicken or turkey. Ground chicken or turkey. Pork trimmed of fat. All fish and seafood. Eggs. Dried beans, peas, or lentils. Unsalted nuts and seeds. Unsalted canned beans.  Dairy  Low-fat dairy products, such as skim or 1% milk, 2% or reduced-fat cheeses, low-fat ricotta or cottage cheese, or plain low-fat yogurt. Low-sodium or reduced-sodium cheeses.  Fats and Oils  Tub margarines without trans fats. Light or reduced-fat mayonnaise and salad dressings (reduced sodium). Avocado. Safflower, olive, or canola oils. Natural peanut or almond butter.  Other  Unsalted popcorn and pretzels.  The items listed above may not be a complete list of recommended foods or beverages. Contact your dietitian for more options.  WHAT FOODS ARE NOT RECOMMENDED?  Grains  White bread. White pasta. White rice. Refined cornbread. Bagels and croissants. Crackers that contain trans fat.  Vegetables  Creamed or fried vegetables. Vegetables in a cheese sauce. Regular canned vegetables. Regular canned tomato sauce and paste. Regular tomato and vegetable juices.  Fruits  Dried fruits. Canned fruit in light or heavy syrup. Fruit juice.  Meat and Other Protein   Products  Fatty cuts of meat. Ribs, chicken wings, bacon, sausage, bologna, salami, chitterlings, fatback, hot dogs, bratwurst, and packaged luncheon meats. Salted nuts and seeds. Canned beans with salt.  Dairy  Whole or 2% milk, cream, half-and-half, and cream cheese. Whole-fat or sweetened yogurt. Full-fat cheeses or blue cheese. Nondairy creamers and whipped toppings. Processed cheese, cheese spreads, or cheese  curds.  Condiments  Onion and garlic salt, seasoned salt, table salt, and sea salt. Canned and packaged gravies. Worcestershire sauce. Tartar sauce. Barbecue sauce. Teriyaki sauce. Soy sauce, including reduced sodium. Steak sauce. Fish sauce. Oyster sauce. Cocktail sauce. Horseradish. Ketchup and mustard. Meat flavorings and tenderizers. Bouillon cubes. Hot sauce. Tabasco sauce. Marinades. Taco seasonings. Relishes.  Fats and Oils  Butter, stick margarine, lard, shortening, ghee, and bacon fat. Coconut, palm kernel, or palm oils. Regular salad dressings.  Other  Pickles and olives. Salted popcorn and pretzels.  The items listed above may not be a complete list of foods and beverages to avoid. Contact your dietitian for more information.  WHERE CAN I FIND MORE INFORMATION?  National Heart, Lung, and Blood Institute: www.nhlbi.nih.gov/health/health-topics/topics/dash/     This information is not intended to replace advice given to you by your health care provider. Make sure you discuss any questions you have with your health care provider.     Document Released: 04/07/2011 Document Revised: 05/09/2014 Document Reviewed: 02/20/2013  Elsevier Interactive Patient Education ©2016 [employer]

## 2015-11-10 NOTE — Progress Notes (Signed)
Ruth Conley, is a 50 y.o. female  [id number]  [id number]  DOB - 28-Oct-1965  CC:  Chief Complaint  Patient presents with  . Hypertension       HPI: Ruth Conley is a 50 y.o. female here today to establish medical care, last seen in clinic 2/17 w/ NP, now here for f/u.  She has a significant past medical history of hypertension, elevated LDL, TIA in 4 2016 right-sided symptoms, admitted on 04/05/2015 for left-sided tingling and left weakness of the legs at the time. She did not receive IV TPA due to lack of objective neurologic exam on arrival. MRI showed right thalamic small infarct. Other stroke workup included MRA of the head and neck as well as a 2-D echo and A1c as well as hypercoagulable and autoimmune workup which was all fairly unremarkable except for an LDL of 119. She is currently on aspirin 81 as well as daily Plavix and Lipitor.   Her HR during admission showed elevated heart rate in the 130s and lisinopril was changed to metoprolol for blood pressure control.   Per pt hx if palpitations, none currently, was suppose to get a 30 day cardiac monitor but it was never done due to insurance issues.  She is going to gym daily now, and has lost about 30lbs since November.  She watches her salt intake very closely as well.  Patient has No headache, No chest pain, No abdominal pain - No Nausea, No new weakness tingling or numbness, No Cough - SOB.    Review of Systems: Per HPI, o/w all systems reviewed and neg.  Allergies  Allergen Reactions  . Lisinopril Cough   Past Medical History  Diagnosis Date  . Hypertension   . Gestational diabetes   . TIA (transient ischemic attack)   . Stroke [facility])     TIA   Current Outpatient Prescriptions on File Prior to Visit  Medication Sig Dispense Refill  . atorvastatin (LIPITOR) 10 MG tablet TAKE 1 TABLET BY MOUTH DAILY AT 6 PM. 30 tablet 2  . clopidogrel (PLAVIX) 75 MG tablet Take 1 tablet (75 mg total) by mouth daily.  30 tablet 2  . ferrous sulfate 325 (65 FE) MG tablet TAKE 1 TABLET BY MOUTH DAILY WITH BREAKFAST. 30 tablet 2  . metoprolol succinate (TOPROL-XL) 50 MG 24 hr tablet TAKE 1 TABLET BY MOUTH DAILY. TAKE WITH OR IMMEDIATELY FOLLOWING A MEAL. 30 tablet 2  . Multiple Vitamins-Minerals (MULTIVITAMIN WITH MINERALS) tablet Take 1 tablet by mouth daily.    . Omega 3 1200 MG CAPS Take 600 mg by mouth daily.    . Coenzyme Q-10 100 MG capsule Take 100 mg by mouth daily. Reported on 11/10/2015    . fluticasone (FLONASE) 50 MCG/ACT nasal spray Place 2 sprays into both nostrils daily. (Patient not taking: Reported on 11/10/2015) 16 g 6   No current facility-administered medications on file prior to visit.   Family History  Problem Relation Age of Onset  . Stroke Paternal Uncle    Social History   Social History  . Marital Status: Legally Separated    Spouse Name: N/A  . Number of Children: N/A  . Years of Education: N/A   Occupational History  . Not on file.   Social History Main Topics  . Smoking status: Never Smoker   . Smokeless tobacco: Not on file  . Alcohol Use: No  . Drug Use: No  . Sexual Activity: Not on file   Other Topics Concern  .  Not on file   Social History Narrative    Objective:   Filed Vitals:   11/10/15 1612  BP: 146/83  Pulse: 90  Temp: 98.6 F (37 C)  Resp: 16    Filed Weights   11/10/15 1612  Weight: 206 lb 12.8 oz (93.804 kg)    BP Readings from Last 3 Encounters:  11/10/15 146/83  08/06/15 137/82  07/21/15 129/88    Physical Exam: Constitutional: Patient appears well-developed and well-nourished. No distress. AAOx3 HENT: Normocephalic, atraumatic, External right and left ear normal. Oropharynx is clear and moist.  bilt tms clear. Eyes: Conjunctivae and EOM are normal. PERRL, no scleral icterus. Neck: Normal ROM. Neck supple. No JVD. CVS: RRR, S1/S2 +, no murmurs, no gallops, no carotid bruit.  Pulmonary: Effort and breath sounds normal, no  stridor, rhonchi, wheezes, rales.  Abdominal: Soft. BS +, no distension, tenderness, rebound or guarding.  Musculoskeletal: Normal range of motion. No edema and no tenderness.  LE: bilat/ no c/c/e, pulses 2+ bilateral. Neuro: Alert.  muscle tone coordination wnl. No cranial nerve deficit grossly. Skin: Skin is warm and dry. No rash noted. Not diaphoretic. No erythema. No pallor. Psychiatric: Normal mood and affect. Behavior, judgment, thought content normal.  Lab Results  Component Value Date   WBC 4.5 06/29/2015   HGB 11.1* 06/29/2015   HCT 34.9* 06/29/2015   MCV 84.3 06/29/2015   PLT 352 06/29/2015   Lab Results  Component Value Date   CREATININE 0.79 04/05/2015   BUN 8 04/05/2015   NA 137 04/05/2015   K 3.5 04/05/2015   CL 106 04/05/2015   CO2 24 04/05/2015    Lab Results  Component Value Date   HGBA1C 5.6 04/06/2015   Lipid Panel     Component Value Date/Time   CHOL 187 04/06/2015 0250   TRIG 34 04/06/2015 0250   HDL 61 04/06/2015 0250   CHOLHDL 3.1 04/06/2015 0250   VLDL 7 04/06/2015 0250   LDLCALC 119* 04/06/2015 0250       Depression screen PHQ 2/9 11/10/2015 02/02/2015  Decreased Interest 0 0  Down, Depressed, Hopeless 0 0  PHQ - 2 Score 0 0  Altered sleeping 0 -  Tired, decreased energy 0 -  Change in appetite 0 -  Feeling bad or failure about yourself  0 -  Trouble concentrating 0 -  Moving slowly or fidgety/restless 0 -  Suicidal thoughts 0 -  PHQ-9 Score 0 -  Difficult doing work/chores Not difficult at all -   04/06/15 MRA head/  neck IMPRESSION: Normal noncontrast MRA of the neck. Given patient's history of RIGHT carotid stenosis, consider follow-up examination with contrast versus carotid ultrasound if contraindicated.  Normal MRA head.  04/05/15 MRI brain IMPRESSION: Subcentimeter acute ischemia RIGHT thalamus.  Stable mild white matter changes most compatible chronic small vessel ischemic disease.  Echo 04/06/15 Study  Conclusions  - Left ventricle: The cavity size was normal. Wall thickness was  normal. Systolic function was normal. The estimated ejection  fraction was in the range of 60% to 65%. Wall motion was normal;  there were no regional wall motion abnormalities. Doppler  parameters are consistent with abnormal left ventricular  relaxation (grade 1 diastolic dysfunction). - Aortic valve: There was no stenosis. - Mitral valve: There was no significant regurgitation. - Right ventricle: The cavity size was normal. Systolic function  was normal. - Pulmonary arteries: No complete TR doppler jet so unable to  estimate PA systolic pressure. - Inferior vena cava: The  vessel was normal in size. The  respirophasic diameter changes were in the normal range (>= 50%),  consistent with normal central venous pressure. - Pericardium, extracardiac: A trivial pericardial effusion was  identified posterior to the heart.  Impressions:  - Normal LV size with EF 60-65%. Normal RV size and systolic  function. No significant valvular abnormalities.  Assessment and plan:   1. HTN (hypertension), benign - continue toprol xl 50 qd - added hctz 12.5 qday - low salt diet discussed today. - BASIC METABOLIC PANEL WITH GFR  2. Anemia, unspecified anemia type - hx of menorrhagia related anemia, sp Mirena ID 4/17, still has menses but not as heavy. - CBC with Differential - Iron, TIBC and Ferritin Panel  3. Hx of TIA 08/2014, readmitted 04/2015 for left sided numbness, MRI showed right thalamic small infarct. - being followed by Neuro - Recommend maintain blood pressure goal <130/80, diabetes with hemoglobin A1c goal below 6.5% and lipids with LDL cholesterol goal below 70 mg/dL.  - will place cards referral for 30day monitor, r/o arrhythmia. , in pt w/ hx of cva, palpitations in past, and HR in 130s during hospitalization.  4. Hx of menorrhagia related anemia, sp Mirena ID 4/17 (by OB), still has  menses but not as heavy.  5. Health maintenance - per pt, Pap smear 08/2014 normal (no documentation) - mm 6/17 negative - per pt, had tdap at pharmacy in 2015.  Return in about 3 months (around 02/10/2016).  The patient was given clear instructions to go to ER or return to medical center if symptoms don't improve, worsen or new problems develop. The patient verbalized understanding. The patient was told to call to get lab results if they haven't heard anything in the next week.    This note has been created with Surveyor, quantity. Any transcriptional errors are unintentional.   Maren Reamer, MD, [place], [place]   11/10/2015, 4:51 PM

## 2015-11-11 ENCOUNTER — Telehealth: Payer: Self-pay

## 2015-11-11 ENCOUNTER — Other Ambulatory Visit: Payer: Self-pay | Admitting: Internal Medicine

## 2015-11-11 LAB — BASIC METABOLIC PANEL WITH GFR
BUN: 10 mg/dL (ref 7–25)
CALCIUM: 8.6 mg/dL (ref 8.6–10.2)
CHLORIDE: 106 mmol/L (ref 98–110)
CO2: 21 mmol/L (ref 20–31)
CREATININE: 0.92 mg/dL (ref 0.50–1.10)
GFR, Est African American: 85 mL/min (ref >=60)
GFR, Est Non African American: 74 mL/min (ref >=60)
Glucose, Bld: 101 mg/dL — ABNORMAL HIGH (ref 65–99)
Potassium: 3.9 mmol/L (ref 3.5–5.3)
Sodium: 137 mmol/L (ref 135–146)

## 2015-11-11 LAB — IRON,TIBC AND FERRITIN PANEL
%SAT: 30 % (ref 11–50)
FERRITIN: 24 ng/mL (ref 10–232)
Iron: 96 ug/dL (ref 40–190)
TIBC: 320 ug/dL (ref 250–450)

## 2015-11-11 MED ORDER — FERROUS SULFATE 325 (65 FE) MG PO TABS: 325.0000 mg | ORAL_TABLET | ORAL | Status: DC

## 2015-11-11 MED FILL — HYDROCHLOROTHIAZIDE 12.5 MG: 12.5 | 30 days supply | Qty: 30 | Fill #0

## 2015-11-11 NOTE — Telephone Encounter (Signed)
Contacted patient to go over lab results patient is aware of results

## 2015-11-25 MED FILL — METOPROLOL SUCC ER 50 MG TA: 50 | 30 days supply | Qty: 30 | Fill #2

## 2015-11-25 MED FILL — ?ATORVASTATIN 10 MG TABLET: 10 | 30 days supply | Qty: 30 | Fill #2

## 2015-11-25 MED FILL — CLOPIDOGREL 75 MG TABLET: 75 | 30 days supply | Qty: 30 | Fill #2

## 2015-12-03 ENCOUNTER — Ambulatory Visit: Payer: Medicaid Other | Admitting: Cardiology

## 2015-12-16 ENCOUNTER — Other Ambulatory Visit: Payer: Self-pay | Admitting: Internal Medicine

## 2015-12-16 MED FILL — METOPROLOL SUCC ER 50 MG TA: 50 | 30 days supply | Qty: 30 | Fill #1

## 2015-12-16 MED FILL — FERROUS SULFATE 325 MG TAB: 325 (65 FE) | 60 days supply | Qty: 30 | Fill #0

## 2015-12-24 MED FILL — CLOPIDOGREL 75 MG TABLET: 75 | 30 days supply | Qty: 30 | Fill #0

## 2015-12-24 MED FILL — HYDROCHLOROTHIAZIDE 12.5 MG: 12.5 | 30 days supply | Qty: 30 | Fill #1

## 2015-12-24 MED FILL — ATORVASTATIN 10 MG TABLET: 10 | 30 days supply | Qty: 30 | Fill #0

## 2016-01-21 ENCOUNTER — Ambulatory Visit: Payer: No Typology Code available for payment source | Admitting: Neurology

## 2016-01-22 ENCOUNTER — Telehealth: Payer: Self-pay | Admitting: Internal Medicine

## 2016-01-22 NOTE — Telephone Encounter (Signed)
Will forward to [facility]

## 2016-01-22 NOTE — Telephone Encounter (Signed)
Patient called the office to request refills for atorvastatin (LIPITOR) 10 MG tablet, clopidogrel (PLAVIX) 75 MG tablet, and metoprolol succinate (TOPROL-XL) 50 MG 24 hr tablet. Please follow up.    Thank you.

## 2016-01-25 MED ORDER — ATORVASTATIN CALCIUM 10 MG PO TABS: ORAL_TABLET | ORAL | 5 refills | Status: DC

## 2016-01-25 MED ORDER — METOPROLOL SUCCINATE ER 50 MG PO TB24: ORAL_TABLET | ORAL | 2 refills | Status: DC

## 2016-01-25 MED ORDER — CLOPIDOGREL BISULFATE 75 MG PO TABS: 75.0000 mg | ORAL_TABLET | Freq: Every day | ORAL | 2 refills | Status: DC

## 2016-01-25 MED FILL — METOPROLOL SUCC ER 50 MG TA: 50 | 30 days supply | Qty: 30 | Fill #0

## 2016-01-25 MED FILL — CLOPIDOGREL 75 MG TABLET: 75 | 30 days supply | Qty: 30 | Fill #0

## 2016-01-25 MED FILL — ?ATORVASTATIN 10 MG TABLET: 10 | 30 days supply | Qty: 30 | Fill #0

## 2016-01-25 NOTE — Telephone Encounter (Signed)
Requested medications refilled 

## 2016-01-26 ENCOUNTER — Other Ambulatory Visit: Payer: Self-pay | Admitting: Pharmacist

## 2016-01-26 DIAGNOSIS — Z23 Encounter for immunization: Secondary | ICD-10-CM

## 2016-02-22 ENCOUNTER — Telehealth: Payer: Self-pay | Admitting: Internal Medicine

## 2016-02-22 MED ORDER — ATORVASTATIN CALCIUM 10 MG PO TABS: ORAL_TABLET | ORAL | 0 refills | Status: DC

## 2016-02-22 MED ORDER — CLOPIDOGREL BISULFATE 75 MG PO TABS: 75.0000 mg | ORAL_TABLET | Freq: Every day | ORAL | 0 refills | Status: DC

## 2016-02-22 MED ORDER — METOPROLOL SUCCINATE ER 50 MG PO TB24: ORAL_TABLET | ORAL | 0 refills | Status: DC

## 2016-02-22 MED FILL — METOPROLOL SUCC ER 50 MG TA: 50 | 30 days supply | Qty: 30 | Fill #0

## 2016-02-22 MED FILL — CLOPIDOGREL 75 MG TABLET: 75 | 30 days supply | Qty: 30 | Fill #0

## 2016-02-22 MED FILL — ATORVASTATIN 10 MG TABLET: 10 | 30 days supply | Qty: 30 | Fill #0

## 2016-02-22 NOTE — Telephone Encounter (Signed)
Patient called to request medication refill for atorvastatin (LIPITOR) 10 MG tablet, clopidogrel (PLAVIX) 75 MG tablet, and metoprolol succinate (TOPROL-XL) 50 MG 24 hr tablet. Please call rx to our pharmacy [facility]).    Thank you

## 2016-02-22 NOTE — Telephone Encounter (Signed)
Requested medications refilled. Patient needs office visit with Dr. Janne Napoleon.

## 2016-03-23 MED FILL — CLOPIDOGREL 75 MG TABLET: 75 | 30 days supply | Qty: 30 | Fill #1

## 2016-03-23 MED FILL — METOPROLOL SUCC ER 50 MG TA: 50 | 30 days supply | Qty: 30 | Fill #1

## 2016-03-23 MED FILL — ATORVASTATIN 10 MG TABLET: 10 | 30 days supply | Qty: 30 | Fill #1

## 2016-04-01 ENCOUNTER — Encounter: Payer: Self-pay | Admitting: Neurology

## 2016-04-01 ENCOUNTER — Ambulatory Visit (INDEPENDENT_AMBULATORY_CARE_PROVIDER_SITE_OTHER): Payer: Self-pay | Admitting: Neurology

## 2016-04-01 VITALS — BP 129/88 | HR 90 | Wt 197.6 lb

## 2016-04-01 DIAGNOSIS — E785 Hyperlipidemia, unspecified: Secondary | ICD-10-CM

## 2016-04-01 DIAGNOSIS — R002 Palpitations: Secondary | ICD-10-CM

## 2016-04-01 DIAGNOSIS — I10 Essential (primary) hypertension: Secondary | ICD-10-CM

## 2016-04-01 DIAGNOSIS — I63531 Cerebral infarction due to unspecified occlusion or stenosis of right posterior cerebral artery: Secondary | ICD-10-CM

## 2016-04-01 NOTE — Progress Notes (Signed)
STROKE NEUROLOGY FOLLOW UP NOTE  NAME: Ruth Conley DOB: 06-01-1965  REASON FOR VISIT: stroke follow up HISTORY FROM: pt and chart  Today we had the pleasure of seeing Ruth Conley in follow-up at our Neurology Clinic. Pt was accompanied by no one.   History Summary  [name] is a 50 y.o. female with history of HTN, elevated LDL, TIA 08/2014 (right sided symptoms) admitted on 04/05/15 for left sided tingling and left leg weakness. She did not receive IV t-PA due to lack of objective neurologic exam on arrival. MRI showed right thalamic small infarct. Other stroke work up including MRA,head and neck, 2D echo and A1C and hypercoagulable and autoimmune work up were all negative except LDL 119. Her ASA was changed to plavix, and lipitor 10 was added. Pt admitted palpitation once a month and her HR during admission up to 130s, lisinopril changed to metoprolol. She was discharged in good condition with recommendation of 30 day monitoring as outpt.   Of note, pt had transient right sided numbness in 08/2014, was admitted to [facility] for TIA work up. All work up were normal except CUS showed right ICA 40-59% stenosis and it is asymptomatic side. She was put on ASA at that time.   04/15/15 follow up - the patient has been doing well. Her left facial mild numbness improved. Left hand numbness feeling is coming back. Left leg wobbly resolved. Reported frontal area mild headache with tension at neck and back of head. Admitted anxiety due to recent stroke. Admitted snoring but no apnea. Denies any smoking, illicit drugs or use OCP. BP today is high in clinic 152/103, but not check BP at home. In doctors office before 130/80. Recently started exercise program  07/21/15 follow up - pt has been doing well without stroke like symptoms. Reported easy bruise on the skin with plavix. Sleep study done showed no evidence of OSA. 30 day cardiac monitoring not done yet due to insurance issue. Reported some  facial and hand numbness when tired or stressed out. On metoprolol, not having much palpitation recently. BP today 129/88.  Interval History  During the interval time, pt has been doing well. Has not had 30 day monitoring yet due to insurance coverage. She is changing job now and will likely to be able to have it done in 3 months. Still has some facial and hand numbness when tired or stressed out. BP 129/88 today. Pt stated that her palpitation usually happens with anxiety and back to bed at night after bathroom use.   REVIEW OF SYSTEMS: Full 14 system review of systems performed and notable only for those listed below and in HPI above, all others are negative:   Constitutional:  Cardiovascular:  Ear/Nose/Throat:  Skin:  Eyes:  Respiratory:  Gastroitestinal:  Genitourinary:  Hematology/Lymphatic:  Endocrine:  Musculoskeletal:  Allergy/Immunology:  Neurological:  Psychiatric:  Sleep:   The following represents the patient's updated allergies and side effects list:  No Known Allergies   The neurologically relevant items on the patient's problem list were reviewed on today's visit.   Neurologic Examination  A problem focused neurological exam (12 or more points of the single system neurologic examination, vital signs counts as 1 point, cranial nerves count for 8 points) was performed.  Last menstrual period 04/04/2015.  General - well nourished, well developed, in no apparent distress.  Ophthalmologic - Sharp disc margins OU.  Cardiovascular - Regular rate and rhythm.  Mental Status -  Level of arousal and orientation  to time, place, and person were intact.  Language including expression, naming, repetition, comprehension was assessed and found intact.  Attention span and concentration were normal.  Recent and remote memory were intact.  Fund of Knowledge was assessed and was intact.  Cranial Nerves II - XII -  II - Visual field intact OU.  III, IV, VI - Extraocular movements  intact.  V - Facial sensation intact bilaterally.  VII - Facial movement intact bilaterally.  VIII - Hearing & vestibular intact bilaterally.  X - Palate elevates symmetrically.  XI - Chin turning & shoulder shrug intact bilaterally.  XII - Tongue protrusion intact.  Motor Strength - The patient's strength was normal in all extremities and pronator drift was absent. Bulk was normal and fasciculations were absent.  Motor Tone - Muscle tone was assessed at the neck and appendages and was normal.  Reflexes - The patient's reflexes were 1+ in all extremities and she had no pathological reflexes.  Sensory - Light touch, temperature/pinprick, vibration and proprioception, and Romberg testing were assessed and were normal.  Coordination - The patient had normal movements in the hands and feet with no ataxia or dysmetria. Tremor was absent.  Gait and Station - The patient's transfers, posture, gait, station, and turns were observed as normal.   Data reviewed:  I personally reviewed the images and agree with the radiology interpretations.   CUS 08/31/14 - - The vertebral arteries appear patent with antegrade flow.  - Findings consistent with 40 - 59 percent stenosis involving the  right internal carotid artery, based on velocities. There is no  plaque morphology to support stenosis.  - Findings consistent with 1- 39 percent stenosis involving the  left internal carotid artery.  - ICA/CCA ratio. right = 1.64. left = 1.15   08/30/14 MRI and MRA  MRA head: Normal.  MRI head: No acute or significant finding. Few tiny white matter foci and not likely of clinical relevance.   Ct Head Wo Contrast  04/05/2015 No acute intracranial pathology.   Mr Angiogram Neck Wo Contrast  04/06/2015 Normal noncontrast MRA of the neck. Given patient's history of RIGHT carotid stenosis, consider follow-up examination with contrast versus carotid ultrasound if contraindicated.   Mr Brain Wo Contrast  04/06/2015  Subcentimeter acute ischemia RIGHT thalamus. Stable mild white matter changes most compatible chronic small vessel ischemic disease.   Mr Jodene Nam Head/brain Wo Cm  04/06/2015 Normal MRA head.   2D echo - - Left ventricle: The cavity size was normal. Wall thickness was normal. Systolic function was normal. The estimated ejection fraction was in the range of 60% to 65%. Wall motion was normal; there were no regional wall motion abnormalities. Doppler parameters are consistent with abnormal left ventricular relaxation (grade 1 diastolic dysfunction). - Aortic valve: There was no stenosis. - Mitral valve: There was no significant regurgitation. - Right ventricle: The cavity size was normal. Systolic function was normal. - Pulmonary arteries: No complete TR doppler jet so unable to estimate PA systolic pressure. - Inferior vena cava: The vessel was normal in size. The respirophasic diameter changes were in the normal range (>= 50%), consistent with normal central venous pressure. - Pericardium, extracardiac: A trivial pericardial effusion was identified posterior to the heart. Impressions: - Normal LV size with EF 60-65%. Normal RV size and systolic function. No significant valvular abnormalities.   Sleep study - no evidence of OSA  Component Latest Ref Rng  08/30/2014  04/03/2015  04/06/2015   Cholesterol 0 - 200  mg/dL   181  187   Triglycerides <150 mg/dL   63  34   HDL Cholesterol >40 mg/dL   59  61   Total CHOL/HDL Ratio   3.1  3.1   VLDL 0 - 40 mg/dL   13  7   LDL (calc) 0 - 99 mg/dL   109  119 (H)   C-ANCA Neg:<1:20 titer    <1:20   P-ANCA Neg:<1:20 titer    <1:20   Atypical P-ANCA titer Neg:<1:20 titer    <1:20   PTT Lupus Anticoagulant 0.0 - 40.6 sec    35.3   DRVVT 0.0 - 44.0 sec    36.3   Lupus Anticoag Interp    Comment:   Beta-2 Glyco I IgG 0 - 20 GPI IgG units    <9   Beta-2-Glycoprotein I IgM 0 - 32 GPI IgM units    <9   Beta-2-Glycoprotein I IgA 0 - 25 GPI  IgA units    <9   Anticardiolipin IgG 0 - 14 GPL U/mL    <9   Anticardiolipin IgM 0 - 12 MPL U/mL    <9   Anticardiolipin IgA 0 - 11 APL U/mL    <9   Hemoglobin A1C 4.8 - 5.6 %  6.0 (H)   5.6   Mean Plasma Glucose  126   114   Recommendations-F5LEID:    Comment   Comment    Comment   Recommendations-PTGENE:    Comment   Additional Information    Comment   TSH 0.350 - 4.500 uIU/mL  1.456     ds DNA Ab 0 - 9 IU/mL    <1   SSA (Ro) (ENA) Antibody, IgG 0.0 - 0.9 AI    <0.2   SSB (La) (ENA) Antibody, IgG 0.0 - 0.9 AI    <0.2   RPR Non Reactive    Non Reactive   Sed Rate 0 - 22 mm/hr    12   HIV Non Reactive    Non Reactive   Compl, Total (CH50) 42 - 60 U/mL    > 60   ANA Ab, IFA    Negative   C3 Complement 82 - 167 mg/dL    122   Complement C4, Body Fluid 14 - 44 mg/dL    33   Antithrombin Activity 75 - 120 %    95   Protein C Activity 73 - 180 %    91   Protein C, Total 60 - 150 %    85   Protein S Activity 63 - 140 %    83   Protein S Ag, Total 60 - 150 %    96   Homocysteine 0.0 - 15.0 umol/L    4.9   Rhuematoid fact SerPl-aCnc 0.0 - 13.9 IU/mL    <10.0   Sickle Cell Screen Negative    Negative    Assessment: As you may recall, she is a 50 y.o. African American female with PMH of HTN and HLD was admitted in 08/2014 for TIA of transient right sided numbness. Work up negative and put on ASA. Admitted again in 04/2015 for left sided numbness, MRI showed right thalamic small infarct. Other stroke work up including MRA,head and neck, 2D echo and A1C and hypercoagulable and autoimmune work up were all negative except LDL 119. Her ASA was changed to plavix, and lipitor 10 was added. Reported intermittent palpitation with anxiety and movement, and 30 day cardiac event monitoring  still pending. She was discharged in good condition. During the interval time, she has been doing well. Had sleepy study showed no evidence of OSA.    Plan:  - continue plavix and lipitor for  stroke prevention - check BP at home and recorder - 30 day cardiac monitoring for evaluation of palpitation. - healthy diet and regular exercise. - Follow up with your primary care physician for stroke risk factor modification. Recommend maintain blood pressure goal 120-140/70-90 and lipids with LDL cholesterol goal below 70 mg/dL.  - follow up in 6 months   No orders of the defined types were placed in this encounter.   No orders of the defined types were placed in this encounter.   Patient Instructions  - continue plavix and lipitor for stroke prevention - check BP at home and recorder - 30 day cardiac monitoring for evaluation of palpitation. - healthy diet and regular exercise. - Follow up with your primary care physician for stroke risk factor modification. Recommend maintain blood pressure goal 120-140/70-90 and lipids with LDL cholesterol goal below 70 mg/dL.  - follow up in 6 months   Rosalin Hawking, MD PhD [facility] [address], [place],  [address] [phone]

## 2016-04-01 NOTE — Patient Instructions (Addendum)
-   continue plavix and lipitor for stroke prevention - check BP at home and recorder - 30 day cardiac monitoring for evaluation of palpitation. - healthy diet and regular exercise. - Follow up with your primary care physician for stroke risk factor modification. Recommend maintain blood pressure goal 120-140/70-90 and lipids with LDL cholesterol goal below 70 mg/dL.  - follow up in 6 months

## 2016-04-20 MED FILL — ATORVASTATIN 10 MG TABLET: 10 | 30 days supply | Qty: 30 | Fill #2

## 2016-04-20 MED FILL — CLOPIDOGREL 75 MG TABLET: 75 | 30 days supply | Qty: 30 | Fill #2

## 2016-04-20 MED FILL — METOPROLOL SUCC ER 50 MG TA: 50 | 30 days supply | Qty: 30 | Fill #2

## 2016-05-24 MED FILL — FERROUS SULFATE 325 MG TAB: 325 (65 FE) | 60 days supply | Qty: 30 | Fill #1

## 2016-05-24 MED FILL — ATORVASTATIN 10 MG TABLET: 10 | 30 days supply | Qty: 30 | Fill #3

## 2016-05-24 MED FILL — METOPROLOL SUCC ER 50 MG TA: 50 | 30 days supply | Qty: 30 | Fill #2

## 2016-05-24 MED FILL — CLOPIDOGREL 75 MG TABLET: 75 | 30 days supply | Qty: 30 | Fill #1

## 2016-06-21 MED FILL — ?ATORVASTATIN 10 MG TABLET: 10 | 30 days supply | Qty: 30 | Fill #4

## 2016-06-21 MED FILL — CLOPIDOGREL 75 MG TABLET: 75 | 30 days supply | Qty: 30 | Fill #2

## 2016-06-21 MED FILL — METOPROLOL SUCC ER 50 MG TA: 50 | 30 days supply | Qty: 30 | Fill #3

## 2016-07-20 ENCOUNTER — Other Ambulatory Visit: Payer: Self-pay | Admitting: Internal Medicine

## 2016-07-20 MED FILL — METOPROLOL SUCC ER 50 MG TA: 50 | 30 days supply | Qty: 30 | Fill #0

## 2016-07-20 MED FILL — CLOPIDOGREL 75 MG TABLET: 75 | 30 days supply | Qty: 30 | Fill #3

## 2016-07-20 MED FILL — ATORVASTATIN 10 MG TABLET: 10 | 30 days supply | Qty: 30 | Fill #5

## 2016-07-20 NOTE — Telephone Encounter (Signed)
Last time refill. Needs f/u visit for further refills.

## 2016-07-20 NOTE — Telephone Encounter (Signed)
Pt scheduled appt for 08-15-16 at 9:00 a.m.

## 2016-07-20 NOTE — Telephone Encounter (Signed)
Could you schedule pt an appointment  

## 2016-07-22 ENCOUNTER — Other Ambulatory Visit: Payer: Self-pay | Admitting: Internal Medicine

## 2016-07-22 DIAGNOSIS — J309 Allergic rhinitis, unspecified: Secondary | ICD-10-CM

## 2016-08-11 ENCOUNTER — Other Ambulatory Visit: Payer: Self-pay | Admitting: Internal Medicine

## 2016-08-11 MED FILL — ATORVASTATIN 10 MG TABLET: 10 | 30 days supply | Qty: 30 | Fill #1

## 2016-08-11 MED FILL — CLOPIDOGREL 75 MG TABLET: 75 | 30 days supply | Qty: 30 | Fill #4

## 2016-08-15 ENCOUNTER — Ambulatory Visit: Payer: BLUE CROSS/BLUE SHIELD | Attending: Internal Medicine | Admitting: Internal Medicine

## 2016-08-15 ENCOUNTER — Encounter: Payer: Self-pay | Admitting: Internal Medicine

## 2016-08-15 VITALS — BP 143/95 | HR 93 | Temp 98.2°F | Resp 16 | Wt 192.4 lb

## 2016-08-15 DIAGNOSIS — I10 Essential (primary) hypertension: Secondary | ICD-10-CM | POA: Diagnosis present

## 2016-08-15 DIAGNOSIS — J302 Other seasonal allergic rhinitis: Secondary | ICD-10-CM | POA: Insufficient documentation

## 2016-08-15 DIAGNOSIS — Z8673 Personal history of transient ischemic attack (TIA), and cerebral infarction without residual deficits: Secondary | ICD-10-CM | POA: Diagnosis not present

## 2016-08-15 DIAGNOSIS — Z131 Encounter for screening for diabetes mellitus: Secondary | ICD-10-CM | POA: Diagnosis not present

## 2016-08-15 DIAGNOSIS — Z888 Allergy status to other drugs, medicaments and biological substances status: Secondary | ICD-10-CM | POA: Diagnosis not present

## 2016-08-15 DIAGNOSIS — R002 Palpitations: Secondary | ICD-10-CM | POA: Insufficient documentation

## 2016-08-15 LAB — POCT GLYCOSYLATED HEMOGLOBIN (HGB A1C): Hemoglobin A1C: 5.3

## 2016-08-15 MED ORDER — HYDROCHLOROTHIAZIDE 12.5 MG PO CAPS: 12.5000 mg | ORAL_CAPSULE | Freq: Every day | ORAL | 3 refills | Status: DC

## 2016-08-15 MED ORDER — FLUTICASONE PROPIONATE 50 MCG/ACT NA SUSP: 2.0000 | Freq: Every day | NASAL | 3 refills | Status: DC

## 2016-08-15 MED ORDER — CLOPIDOGREL BISULFATE 75 MG PO TABS: 75.0000 mg | ORAL_TABLET | Freq: Every day | ORAL | 2 refills | Status: DC

## 2016-08-15 MED ORDER — METOPROLOL SUCCINATE ER 50 MG PO TB24: 50.0000 mg | ORAL_TABLET | Freq: Every day | ORAL | 2 refills | Status: DC

## 2016-08-15 MED ORDER — ATORVASTATIN CALCIUM 10 MG PO TABS: ORAL_TABLET | ORAL | 2 refills | Status: DC

## 2016-08-15 MED FILL — ?HYDROCHLOROTHIAZIDE 12.5MG: 12.5 | 30 days supply | Qty: 30 | Fill #0

## 2016-08-15 MED FILL — ?METOPROLOL SUCC ER 50 MG: 50 MG | 30 days supply | Qty: 30 | Fill #0

## 2016-08-15 NOTE — Patient Instructions (Addendum)
rn Ruth Conley - 2 wks bp check, bring in home bp monitor   Low-Sodium Eating Plan Sodium, which is an element that makes up salt, helps you maintain a healthy balance of fluids in your body. Too much sodium can increase your blood pressure and cause fluid and waste to be held in your body. Your health care provider or dietitian may recommend following this plan if you have high blood pressure (hypertension), kidney disease, liver disease, or heart failure. Eating less sodium can help lower your blood pressure, reduce swelling, and protect your heart, liver, and kidneys. What are tips for following this plan? General guidelines   Most people on this plan should limit their sodium intake to 1,500-2,000 mg (milligrams) of sodium each day. Reading food labels   The Nutrition Facts label lists the amount of sodium in one serving of the food. If you eat more than one serving, you must multiply the listed amount of sodium by the number of servings.  Choose foods with less than 140 mg of sodium per serving.  Avoid foods with 300 mg of sodium or more per serving. Shopping   Look for lower-sodium products, often labeled as "low-sodium" or "no salt added."  Always check the sodium content even if foods are labeled as "unsalted" or "no salt added".  Buy fresh foods.  Avoid canned foods and premade or frozen meals.  Avoid canned, cured, or processed meats  Buy breads that have less than 80 mg of sodium per slice. Cooking   Eat more home-cooked food and less restaurant, buffet, and fast food.  Avoid adding salt when cooking. Use salt-free seasonings or herbs instead of table salt or sea salt. Check with your health care provider or pharmacist before using salt substitutes.  Cook with plant-based oils, such as canola, sunflower, or olive oil. Meal planning   When eating at a restaurant, ask that your food be prepared with less salt or no salt, if possible.  Avoid foods that contain MSG  (monosodium glutamate). MSG is sometimes added to [place] food, bouillon, and some canned foods. What foods are recommended? The items listed may not be a complete list. Talk with your dietitian about what dietary choices are best for you. Grains  Low-sodium cereals, including oats, puffed wheat and rice, and shredded wheat. Low-sodium crackers. Unsalted rice. Unsalted pasta. Low-sodium bread. Whole-grain breads and whole-grain pasta. Vegetables  Fresh or frozen vegetables. "No salt added" canned vegetables. "No salt added" tomato sauce and paste. Low-sodium or reduced-sodium tomato and vegetable juice. Fruits  Fresh, frozen, or canned fruit. Fruit juice. Meats and other protein foods  Fresh or frozen (no salt added) meat, poultry, seafood, and fish. Low-sodium canned tuna and salmon. Unsalted nuts. Dried peas, beans, and lentils without added salt. Unsalted canned beans. Eggs. Unsalted nut butters. Dairy  Milk. Soy milk. Cheese that is naturally low in sodium, such as ricotta cheese, fresh mozzarella, or Swiss cheese Low-sodium or reduced-sodium cheese. Cream cheese. Yogurt. Fats and oils  Unsalted butter. Unsalted margarine with no trans fat. Vegetable oils such as canola or olive oils. Seasonings and other foods  Fresh and dried herbs and spices. Salt-free seasonings. Low-sodium mustard and ketchup. Sodium-free salad dressing. Sodium-free light mayonnaise. Fresh or refrigerated horseradish. Lemon juice. Vinegar. Homemade, reduced-sodium, or low-sodium soups. Unsalted popcorn and pretzels. Low-salt or salt-free chips. What foods are not recommended? The items listed may not be a complete list. Talk with your dietitian about what dietary choices are best for you. Grains  Instant hot cereals. Bread stuffing, pancake, and biscuit mixes. Croutons. Seasoned rice or pasta mixes. Noodle soup cups. Boxed or frozen macaroni and cheese. Regular salted crackers. Self-rising flour. Vegetables    Sauerkraut, pickled vegetables, and relishes. Olives. [place] fries. Onion rings. Regular canned vegetables (not low-sodium or reduced-sodium). Regular canned tomato sauce and paste (not low-sodium or reduced-sodium). Regular tomato and vegetable juice (not low-sodium or reduced-sodium). Frozen vegetables in sauces. Meats and other protein foods  Meat or fish that is salted, canned, smoked, spiced, or pickled. Bacon, ham, sausage, hotdogs, corned beef, chipped beef, packaged lunch meats, salt pork, jerky, pickled herring, anchovies, regular canned tuna, sardines, salted nuts. Dairy  Processed cheese and cheese spreads. Cheese curds. Blue cheese. Feta cheese. String cheese. Regular cottage cheese. Buttermilk. Canned milk. Fats and oils  Salted butter. Regular margarine. Ghee. Bacon fat. Seasonings and other foods  Onion salt, garlic salt, seasoned salt, table salt, and sea salt. Canned and packaged gravies. Worcestershire sauce. Tartar sauce. Barbecue sauce. Teriyaki sauce. Soy sauce, including reduced-sodium. Steak sauce. Fish sauce. Oyster sauce. Cocktail sauce. Horseradish that you find on the shelf. Regular ketchup and mustard. Meat flavorings and tenderizers. Bouillon cubes. Hot sauce and Tabasco sauce. Premade or packaged marinades. Premade or packaged taco seasonings. Relishes. Regular salad dressings. Salsa. Potato and tortilla chips. Corn chips and puffs. Salted popcorn and pretzels. Canned or dried soups. Pizza. Frozen entrees and pot pies. Summary  Eating less sodium can help lower your blood pressure, reduce swelling, and protect your heart, liver, and kidneys.  Most people on this plan should limit their sodium intake to 1,500-2,000 mg (milligrams) of sodium each day.  Canned, boxed, and frozen foods are high in sodium. Restaurant foods, fast foods, and pizza are also very high in sodium. You also get sodium by adding salt to food.  Try to cook at home, eat more fresh fruits and  vegetables, and eat less fast food, canned, processed, or prepared foods. This information is not intended to replace advice given to you by your health care provider. Make sure you discuss any questions you have with your health care provider. Document Released: 10/08/2001 Document Revised: 04/11/2016 Document Reviewed: 04/11/2016 Elsevier Interactive Patient Education  2017 [employer]  -  Hypertension Hypertension is another name for high blood pressure. High blood pressure forces your heart to work harder to pump blood. This can cause problems over time. There are two numbers in a blood pressure reading. There is a top number (systolic) over a bottom number (diastolic). It is best to have a blood pressure below 120/80. Healthy choices can help lower your blood pressure. You may need medicine to help lower your blood pressure if:  Your blood pressure cannot be lowered with healthy choices.  Your blood pressure is higher than 130/80. Follow these instructions at home: Eating and drinking   If directed, follow the DASH eating plan. This diet includes:  Filling half of your plate at each meal with fruits and vegetables.  Filling one quarter of your plate at each meal with whole grains. Whole grains include whole wheat pasta, brown rice, and whole grain bread.  Eating or drinking low-fat dairy products, such as skim milk or low-fat yogurt.  Filling one quarter of your plate at each meal with low-fat (lean) proteins. Low-fat proteins include fish, skinless chicken, eggs, beans, and tofu.  Avoiding fatty meat, cured and processed meat, or chicken with skin.  Avoiding premade or processed food.  Eat less than 1,500 mg of  salt (sodium) a day.  Limit alcohol use to no more than 1 drink a day for nonpregnant women and 2 drinks a day for men. One drink equals 12 oz of beer, 5 oz of wine, or 1 oz of hard liquor. Lifestyle   Work with your doctor to stay at a healthy weight or to lose  weight. Ask your doctor what the best weight is for you.  Get at least 30 minutes of exercise that causes your heart to beat faster (aerobic exercise) most days of the week. This may include walking, swimming, or biking.  Get at least 30 minutes of exercise that strengthens your muscles (resistance exercise) at least 3 days a week. This may include lifting weights or pilates.  Do not use any products that contain nicotine or tobacco. This includes cigarettes and e-cigarettes. If you need help quitting, ask your doctor.  Check your blood pressure at home as told by your doctor.  Keep all follow-up visits as told by your doctor. This is important. Medicines   Take over-the-counter and prescription medicines only as told by your doctor. Follow directions carefully.  Do not skip doses of blood pressure medicine. The medicine does not work as well if you skip doses. Skipping doses also puts you at risk for problems.  Ask your doctor about side effects or reactions to medicines that you should watch for. Contact a doctor if:  You think you are having a reaction to the medicine you are taking.  You have headaches that keep coming back (recurring).  You feel dizzy.  You have swelling in your ankles.  You have trouble with your vision. Get help right away if:  You get a very bad headache.  You start to feel confused.  You feel weak or numb.  You feel faint.  You get very bad pain in your:  Chest.  Belly (abdomen).  You throw up (vomit) more than once.  You have trouble breathing. Summary  Hypertension is another name for high blood pressure.  Making healthy choices can help lower blood pressure. If your blood pressure cannot be controlled with healthy choices, you may need to take medicine. This information is not intended to replace advice given to you by your health care provider. Make sure you discuss any questions you have with your health care provider. Document  Released: 10/05/2007 Document Revised: 03/16/2016 Document Reviewed: 03/16/2016 Elsevier Interactive Patient Education  2017 [place] Maintenance, Female Adopting a healthy lifestyle and getting preventive care can go a long way to promote health and wellness. Talk with your health care provider about what schedule of regular examinations is right for you. This is a good chance for you to check in with your provider about disease prevention and staying healthy. In between checkups, there are plenty of things you can do on your own. Experts have done a lot of research about which lifestyle changes and preventive measures are most likely to keep you healthy. Ask your health care provider for more information. Weight and diet Eat a healthy diet  Be sure to include plenty of vegetables, fruits, low-fat dairy products, and lean protein.  Do not eat a lot of foods high in solid fats, added sugars, or salt.  Get regular exercise. This is one of the most important things you can do for your health.  Most adults should exercise for at least 150 minutes each week. The exercise should increase your heart rate and make you sweat (moderate-intensity exercise).  Most adults should also do strengthening exercises at least twice a week. This is in addition to the moderate-intensity exercise. Maintain a healthy weight  Body mass index (BMI) is a measurement that can be used to identify possible weight problems. It estimates body fat based on height and weight. Your health care provider can help determine your BMI and help you achieve or maintain a healthy weight.  For females 89 years of age and older:  A BMI below 18.5 is considered underweight.  A BMI of 18.5 to 24.9 is normal.  A BMI of 25 to 29.9 is considered overweight.  A BMI of 30 and above is considered obese. Watch levels of cholesterol and blood lipids  You should start having your blood tested for lipids and cholesterol at  49 years of age, then have this test every 5 years.  You may need to have your cholesterol levels checked more often if:  Your lipid or cholesterol levels are high.  You are older than 49 years of age.  You are at high risk for heart disease. Cancer screening Lung Cancer  Lung cancer screening is recommended for adults 44-48 years old who are at high risk for lung cancer because of a history of smoking.  A yearly low-dose CT scan of the lungs is recommended for people who:  Currently smoke.  Have quit within the past 15 years.  Have at least a 30-pack-year history of smoking. A pack year is smoking an average of one pack of cigarettes a day for 1 year.  Yearly screening should continue until it has been 15 years since you quit.  Yearly screening should stop if you develop a health problem that would prevent you from having lung cancer treatment. Breast Cancer  Practice breast self-awareness. This means understanding how your breasts normally appear and feel.  It also means doing regular breast self-exams. Let your health care provider know about any changes, no matter how small.  If you are in your 20s or 30s, you should have a clinical breast exam (CBE) by a health care provider every 1-3 years as part of a regular health exam.  If you are 46 or older, have a CBE every year. Also consider having a breast X-ray (mammogram) every year.  If you have a family history of breast cancer, talk to your health care provider about genetic screening.  If you are at high risk for breast cancer, talk to your health care provider about having an MRI and a mammogram every year.  Breast cancer gene (BRCA) assessment is recommended for women who have family members with BRCA-related cancers. BRCA-related cancers include:  Breast.  Ovarian.  Tubal.  Peritoneal cancers.  Results of the assessment will determine the need for genetic counseling and BRCA1 and BRCA2 testing. Cervical  Cancer  Your health care provider may recommend that you be screened regularly for cancer of the pelvic organs (ovaries, uterus, and vagina). This screening involves a pelvic examination, including checking for microscopic changes to the surface of your cervix (Pap test). You may be encouraged to have this screening done every 3 years, beginning at age 65.  For women ages 46-65, health care providers may recommend pelvic exams and Pap testing every 3 years, or they may recommend the Pap and pelvic exam, combined with testing for human papilloma virus (HPV), every 5 years. Some types of HPV increase your risk of cervical cancer. Testing for HPV may also be done on women of any age with  unclear Pap test results.  Other health care providers may not recommend any screening for nonpregnant women who are considered low risk for pelvic cancer and who do not have symptoms. Ask your health care provider if a screening pelvic exam is right for you.  If you have had past treatment for cervical cancer or a condition that could lead to cancer, you need Pap tests and screening for cancer for at least 20 years after your treatment. If Pap tests have been discontinued, your risk factors (such as having a new sexual partner) need to be reassessed to determine if screening should resume. Some women have medical problems that increase the chance of getting cervical cancer. In these cases, your health care provider may recommend more frequent screening and Pap tests. Colorectal Cancer  This type of cancer can be detected and often prevented.  Routine colorectal cancer screening usually begins at 49 years of age and continues through 49 years of age.  Your health care provider may recommend screening at an earlier age if you have risk factors for colon cancer.  Your health care provider may also recommend using home test kits to check for hidden blood in the stool.  A small camera at the end of a tube can be used to  examine your colon directly (sigmoidoscopy or colonoscopy). This is done to check for the earliest forms of colorectal cancer.  Routine screening usually begins at age 74.  Direct examination of the colon should be repeated every 5-10 years through 49 years of age. However, you may need to be screened more often if early forms of precancerous polyps or small growths are found. Skin Cancer  Check your skin from head to toe regularly.  Tell your health care provider about any new moles or changes in moles, especially if there is a change in a mole's shape or color.  Also tell your health care provider if you have a mole that is larger than the size of a pencil eraser.  Always use sunscreen. Apply sunscreen liberally and repeatedly throughout the day.  Protect yourself by wearing long sleeves, pants, a wide-brimmed hat, and sunglasses whenever you are outside. Heart disease, diabetes, and high blood pressure  High blood pressure causes heart disease and increases the risk of stroke. High blood pressure is more likely to develop in:  People who have blood pressure in the high end of the normal range (130-139/85-89 mm Hg).  People who are overweight or obese.  People who are African American.  If you are 61-70 years of age, have your blood pressure checked every 3-5 years. If you are 2 years of age or older, have your blood pressure checked every year. You should have your blood pressure measured twice-once when you are at a hospital or clinic, and once when you are not at a hospital or clinic. Record the average of the two measurements. To check your blood pressure when you are not at a hospital or clinic, you can use:  An automated blood pressure machine at a pharmacy.  A home blood pressure monitor.  If you are between 59 years and 22 years old, ask your health care provider if you should take aspirin to prevent strokes.  Have regular diabetes screenings. This involves taking a blood  sample to check your fasting blood sugar level.  If you are at a normal weight and have a low risk for diabetes, have this test once every three years after 49 years of age.  If you are overweight and have a high risk for diabetes, consider being tested at a younger age or more often. Preventing infection Hepatitis B  If you have a higher risk for hepatitis B, you should be screened for this virus. You are considered at high risk for hepatitis B if:  You were born in a country where hepatitis B is common. Ask your health care provider which countries are considered high risk.  Your parents were born in a high-risk country, and you have not been immunized against hepatitis B (hepatitis B vaccine).  You have HIV or AIDS.  You use needles to inject street drugs.  You live with someone who has hepatitis B.  You have had sex with someone who has hepatitis B.  You get hemodialysis treatment.  You take certain medicines for conditions, including cancer, organ transplantation, and autoimmune conditions. Hepatitis C  Blood testing is recommended for:  Everyone born from [birth year] through 1963.  Anyone with known risk factors for hepatitis C. Sexually transmitted infections (STIs)  You should be screened for sexually transmitted infections (STIs) including gonorrhea and chlamydia if:  You are sexually active and are younger than 49 years of age.  You are older than 49 years of age and your health care provider tells you that you are at risk for this type of infection.  Your sexual activity has changed since you were last screened and you are at an increased risk for chlamydia or gonorrhea. Ask your health care provider if you are at risk.  If you do not have HIV, but are at risk, it may be recommended that you take a prescription medicine daily to prevent HIV infection. This is called pre-exposure prophylaxis (PrEP). You are considered at risk if:  You are sexually active and do not  regularly use condoms or know the HIV status of your partner(s).  You take drugs by injection.  You are sexually active with a partner who has HIV. Talk with your health care provider about whether you are at high risk of being infected with HIV. If you choose to begin PrEP, you should first be tested for HIV. You should then be tested every 3 months for as long as you are taking PrEP. Pregnancy  If you are premenopausal and you may become pregnant, ask your health care provider about preconception counseling.  If you may become pregnant, take 400 to 800 micrograms (mcg) of folic acid every day.  If you want to prevent pregnancy, talk to your health care provider about birth control (contraception). Osteoporosis and menopause  Osteoporosis is a disease in which the bones lose minerals and strength with aging. This can result in serious bone fractures. Your risk for osteoporosis can be identified using a bone density scan.  If you are 28 years of age or older, or if you are at risk for osteoporosis and fractures, ask your health care provider if you should be screened.  Ask your health care provider whether you should take a calcium or vitamin D supplement to lower your risk for osteoporosis.  Menopause may have certain physical symptoms and risks.  Hormone replacement therapy may reduce some of these symptoms and risks. Talk to your health care provider about whether hormone replacement therapy is right for you. Follow these instructions at home:  Schedule regular health, dental, and eye exams.  Stay current with your immunizations.  Do not use any tobacco products including cigarettes, chewing tobacco, or electronic cigarettes.  If you are  pregnant, do not drink alcohol.  If you are breastfeeding, limit how much and how often you drink alcohol.  Limit alcohol intake to no more than 1 drink per day for nonpregnant women. One drink equals 12 ounces of beer, 5 ounces of wine, or 1  ounces of hard liquor.  Do not use street drugs.  Do not share needles.  Ask your health care provider for help if you need support or information about quitting drugs.  Tell your health care provider if you often feel depressed.  Tell your health care provider if you have ever been abused or do not feel safe at home. This information is not intended to replace advice given to you by your health care provider. Make sure you discuss any questions you have with your health care provider. Document Released: 11/01/2010 Document Revised: 09/24/2015 Document Reviewed: 01/20/2015 Elsevier Interactive Patient Education  2017 [employer].

## 2016-08-15 NOTE — Progress Notes (Signed)
Ruth Conley, is a 51 y.o. female  EHM:[id number]  ZMO:[id number]  DOB - May 30, 1965  Chief Complaint  Patient presents with  . Hypertension  . Establish Care        Subjective:   Jamesina Gaugh is a 51 y.o. female here today for a follow up visit, las seen 11/10/15. Pt overall doing well, hx of htn, tia 4/16 (right sided sxs).    She states she is overall doing well, eating better, trying to lose weight. Drinks lots of water and green tea. Notes occasional palpitations still, denies loc/syncope.    Of note, she did not take her bp meds this am. She notes her bp typically wnl at home.  Patient has No headache, No chest pain, No abdominal pain - No Nausea, No new weakness tingling or numbness, No Cough - SOB.  No problems updated.  ALLERGIES: Allergies  Allergen Reactions  . Lisinopril Cough    PAST MEDICAL HISTORY: Past Medical History:  Diagnosis Date  . Gestational diabetes   . Hypertension   . Stroke [facility])    TIA  . TIA (transient ischemic attack)     MEDICATIONS AT HOME: Prior to Admission medications   Medication Sig Start Date End Date Taking? Authorizing Provider  atorvastatin (LIPITOR) 10 MG tablet TAKE 1 TABLET BY MOUTH DAILY AT 6 PM. 08/15/16   Maren Reamer, MD  clopidogrel (PLAVIX) 75 MG tablet Take 1 tablet (75 mg total) by mouth daily. 08/15/16   Maren Reamer, MD  Coenzyme Q-10 100 MG capsule Take 100 mg by mouth daily. Reported on 11/10/2015    Historical Provider, MD  ferrous sulfate 325 (65 FE) MG tablet Take 1 tablet (325 mg total) by mouth every other day. 11/11/15   Maren Reamer, MD  fluticasone (FLONASE) 50 MCG/ACT nasal spray Place 2 sprays into both nostrils daily. 08/15/16   Maren Reamer, MD  hydrochlorothiazide (MICROZIDE) 12.5 MG capsule Take 1 capsule (12.5 mg total) by mouth daily. 08/15/16   Maren Reamer, MD  metoprolol succinate (TOPROL-XL) 50 MG 24 hr tablet Take 1 tablet (50 mg total) by mouth daily. Take with or  immediately following a meal. 08/15/16   Maren Reamer, MD  Multiple Vitamins-Minerals (MULTIVITAMIN WITH MINERALS) tablet Take 1 tablet by mouth daily.    Historical Provider, MD  Omega 3 1200 MG CAPS Take 600 mg by mouth daily.    Historical Provider, MD     Objective:   Vitals:   08/15/16 0912  BP: (!) 143/95  Pulse: 93  Resp: 16  Temp: 98.2 F (36.8 C)  TempSrc: Oral  SpO2: 98%  Weight: 192 lb 6.4 oz (87.3 kg)    Exam General appearance : Awake, alert, not in any distress. Speech Clear. Not toxic looking, pleasant HEENT: Atraumatic and Normocephalic, pupils equally reactive to light.  bilat TMs clearn.  Neck: supple, no JVD. No cervical lymphadenopathy.   No cartoid bruits. No goiter. Chest:Good air entry bilaterally, no added sounds. CVS: S1 S2 regular, no murmurs/gallups or rubs. Abdomen: Bowel sounds active, obese, Non tender and not distended with no gaurding, rigidity or rebound. Extremities: B/L Lower Ext shows no edema, both legs are warm to touch Neurology: Awake alert, and oriented X 3, CN II-XII grossly intact, Non focal Skin:No Rash  Data Review Lab Results  Component Value Date   HGBA1C 5.3 08/15/2016   HGBA1C 5.6 04/06/2015   HGBA1C 6.0 (H) 08/30/2014    Depression screen [facility] 2/9 08/15/2016  11/10/2015 02/02/2015  Decreased Interest 0 0 0  Down, Depressed, Hopeless 0 0 0  PHQ - 2 Score 0 0 0  Altered sleeping - 0 -  Tired, decreased energy - 0 -  Change in appetite - 0 -  Feeling bad or failure about yourself  - 0 -  Trouble concentrating - 0 -  Moving slowly or fidgety/restless - 0 -  Suicidal thoughts - 0 -  PHQ-9 Score - 0 -  Difficult doing work/chores - Not difficult at all -      Assessment & Plan   1. Essential hypertension Encouraged to take bp meds, low salt diet - Basic metabolic panel - CBC with Differential - fu RN 2 wks for bp check, advised to bring in bp monitor to corelate  2. Palpitation - TSH - Ambulatory referral to  Cardiology  - may need halter monitor  3. Seasonal allergic rhinitis, unspecified trigger - worse lately due to pollen - continue otc claritin. - fluticasone (FLONASE) 50 MCG/ACT nasal spray; Place 2 sprays into both nostrils daily.  Dispense: 16 g; Refill: 3   4. Diabetes mellitus screening - no dm - POCT glycosylated hemoglobin (Hb A1C) 5.3   5. uptodate w/ health-maintenance  Patient have been counseled extensively about nutrition and exercise  Return in about 3 months (around 11/14/2016), or if symptoms worsen or fail to improve.  The patient was given clear instructions to go to ER or return to medical center if symptoms don't improve, worsen or new problems develop. The patient verbalized understanding. The patient was told to call to get lab results if they haven't heard anything in the next week.   This note has been created with Surveyor, quantity. Any transcriptional errors are unintentional.   Maren Reamer, MD, [place] [facility] [place], [place]   08/15/2016, 9:36 AM

## 2016-08-16 LAB — CBC WITH DIFFERENTIAL/PLATELET
Basophils Absolute: 0 10*3/uL (ref 0.0–0.2)
Basos: 1 %
EOS (ABSOLUTE): 0.1 10*3/uL (ref 0.0–0.4)
Eos: 2 %
HEMOGLOBIN: 12.2 g/dL (ref 11.1–15.9)
Hematocrit: 37.2 % (ref 34.0–46.6)
IMMATURE GRANS (ABS): 0 10*3/uL (ref 0.0–0.1)
IMMATURE GRANULOCYTES: 0 %
Lymphocytes Absolute: 1.3 10*3/uL (ref 0.7–3.1)
Lymphs: 35 %
MCH: 27.5 pg (ref 26.6–33.0)
MCHC: 32.8 g/dL (ref 31.5–35.7)
MCV: 84 fL (ref 79–97)
MONOCYTES: 12 %
Monocytes Absolute: 0.4 10*3/uL (ref 0.1–0.9)
NEUTROS PCT: 50 %
Neutrophils Absolute: 1.8 10*3/uL (ref 1.4–7.0)
PLATELETS: 331 10*3/uL (ref 150–379)
RBC: 4.43 x10E6/uL (ref 3.77–5.28)
RDW: 15.4 % (ref 12.3–15.4)
WBC: 3.6 10*3/uL (ref 3.4–10.8)

## 2016-08-16 LAB — BASIC METABOLIC PANEL
BUN/Creatinine Ratio: 17 (ref 9–23)
BUN: 12 mg/dL (ref 6–24)
CHLORIDE: 104 mmol/L (ref 96–106)
CO2: 22 mmol/L (ref 18–29)
Calcium: 8.6 mg/dL — ABNORMAL LOW (ref 8.7–10.2)
Creatinine, Ser: 0.71 mg/dL (ref 0.57–1.00)
GFR calc non Af Amer: 101 mL/min/{1.73_m2} (ref >59)
GFR, EST AFRICAN AMERICAN: 116 mL/min/{1.73_m2} (ref >59)
GLUCOSE: 86 mg/dL (ref 65–99)
POTASSIUM: 4.3 mmol/L (ref 3.5–5.2)
Sodium: 139 mmol/L (ref 134–144)

## 2016-08-16 LAB — TSH: TSH: 1.34 u[IU]/mL (ref 0.450–4.500)

## 2016-08-19 ENCOUNTER — Telehealth: Payer: Self-pay

## 2016-08-19 NOTE — Telephone Encounter (Signed)
Contacted pt to go over lab results pt didn't answer lvm informing pt that all lab work is normal and if she has any questions or concerns to give [place] a call

## 2016-09-01 ENCOUNTER — Encounter: Payer: Self-pay | Admitting: Cardiology

## 2016-09-05 ENCOUNTER — Ambulatory Visit (INDEPENDENT_AMBULATORY_CARE_PROVIDER_SITE_OTHER): Payer: BLUE CROSS/BLUE SHIELD | Admitting: Student

## 2016-09-05 ENCOUNTER — Ambulatory Visit: Payer: BLUE CROSS/BLUE SHIELD | Admitting: Cardiology

## 2016-09-05 ENCOUNTER — Encounter: Payer: Self-pay | Admitting: Cardiology

## 2016-09-05 ENCOUNTER — Encounter: Payer: Self-pay | Admitting: Student

## 2016-09-05 VITALS — BP 134/90 | HR 85 | Ht 67.0 in | Wt 195.8 lb

## 2016-09-05 DIAGNOSIS — I6521 Occlusion and stenosis of right carotid artery: Secondary | ICD-10-CM | POA: Diagnosis not present

## 2016-09-05 DIAGNOSIS — R002 Palpitations: Secondary | ICD-10-CM | POA: Diagnosis not present

## 2016-09-05 DIAGNOSIS — G459 Transient cerebral ischemic attack, unspecified: Secondary | ICD-10-CM

## 2016-09-05 NOTE — Progress Notes (Signed)
Cardiology Office Note    Date:  09/05/2016   ID:  Ruth Conley, DOB [birth year], MRN [id number]  PCP:  Maren Reamer, MD  Cardiologist: New - Dr. Sallyanne Kuster (DOD)   Chief Complaint  Patient presents with  . New Patient (Initial Visit)    Palpitations    History of Present Illness:    Ruth Conley is a 51 y.o. female with past medical history of HTN and prior TIA (2016) who is being seen today for the evaluation of palpitations at the request of Lottie Mussel T, MD.  She was recently evaluated by her PCP on 08/15/2016 and reported having intermittent palpitations. Labs were checked at that time and showed WBC of 3.6, Hgb 12.2, platelets 331. K+ 4.3. Creatinine 0.71. TSH 1.34.  In talking with the patient today, she reports episodes of palpitations occurring since her TIA in 2016. Over the past several months, these have increased in frequency and severity. She notices these mostly at rest and when she wakes up in the night to go to the restroom. Her symptoms can last for 5-10 minutes and spontaneously resolve. She denies any associated chest discomfort, dyspnea, lightheadedness, dizziness, or presyncope. No orthopnea, PND, or lower extremity edema. No significant caffeine use and no alcohol use.  She does have a history of a TIA occurring in 2016 for which the etiology is unknown. An echocardiogram was obtained which showed an EF of 60-65% with no wall motion abnormalities. Carotid dopplers with 40-59% RICA stenosis and 6-56% LICA stenosis. An event monitor was not ordered at that time as she was told her insurance would not cover this. She has since switched insurance companies.  She denies any prior history of CAD or cardiac arrhythmias. Reports her father had an MI at age 48. She denies any prior alcohol use, tobacco use, or recreational drug use. She does exercise 3-4 days per week which consists mostly of aerobic activities. Denies any exertional symptoms with this.     Past Medical History:  Diagnosis Date  . Carotid stenosis    a. 08/2014: dopplers showing 40-59% RICA stenosis and 8-12% LICA stenosis  . Gestational diabetes   . Hypertension   . TIA (transient ischemic attack) 2016    Past Surgical History:  Procedure Laterality Date  . CESAREAN SECTION    . TRANSTHORACIC ECHOCARDIOGRAM  04/2015   (no change from 08/2014) Normal LV size and thickness. EF 60-65%. GR 1 DD. No wall motion abnormality. No source of embolism noted    Current Medications: Outpatient Medications Prior to Visit  Medication Sig Dispense Refill  . atorvastatin (LIPITOR) 10 MG tablet TAKE 1 TABLET BY MOUTH DAILY AT 6 PM. 90 tablet 2  . clopidogrel (PLAVIX) 75 MG tablet Take 1 tablet (75 mg total) by mouth daily. 90 tablet 2  . Coenzyme Q-10 100 MG capsule Take 100 mg by mouth daily. Reported on 11/10/2015    . ferrous sulfate 325 (65 FE) MG tablet Take 1 tablet (325 mg total) by mouth every other day. 30 tablet 2  . fluticasone (FLONASE) 50 MCG/ACT nasal spray Place 2 sprays into both nostrils daily. 16 g 3  . hydrochlorothiazide (MICROZIDE) 12.5 MG capsule Take 1 capsule (12.5 mg total) by mouth daily. 90 capsule 3  . metoprolol succinate (TOPROL-XL) 50 MG 24 hr tablet Take 1 tablet (50 mg total) by mouth daily. Take with or immediately following a meal. 90 tablet 2  . Multiple Vitamins-Minerals (MULTIVITAMIN WITH MINERALS) tablet Take 1 tablet by  mouth daily.    . Omega 3 1200 MG CAPS Take 600 mg by mouth daily.     No facility-administered medications prior to visit.      Allergies:   Lisinopril   Social History   Social History  . Marital status: Legally Separated    Spouse name: N/A  . Number of children: N/A  . Years of education: N/A   Social History Main Topics  . Smoking status: Never Smoker  . Smokeless tobacco: Never Used  . Alcohol use No  . Drug use: No  . Sexual activity: Not Asked   Other Topics Concern  . None   Social History Narrative   . None     Family History:  The patient's family history includes CAD (age of onset: 36) in her father; Hypertension in her mother; Stroke in her paternal uncle.   Review of Systems:   Please see the history of present illness.     General:  No chills, fever, night sweats or weight changes.  Cardiovascular:  No chest pain, dyspnea on exertion, edema, orthopnea, paroxysmal nocturnal dyspnea. Positive for palpitations.  Dermatological: No rash, lesions/masses Respiratory: No cough, dyspnea Urologic: No hematuria, dysuria Abdominal:   No nausea, vomiting, diarrhea, bright red blood per rectum, melena, or hematemesis Neurologic:  No visual changes, wkns, changes in mental status. All other systems reviewed and are otherwise negative except as noted above.   Physical Exam:    VS:  BP 134/90   Pulse 85   Ht 5\' 7"  (1.702 m)   Wt 195 lb 12.8 oz (88.8 kg)   BMI 30.67 kg/m    General: Well developed, well nourished [place] female appearing in no acute distress. Head: Normocephalic, atraumatic, sclera non-icteric, no xanthomas, nares are without discharge.  Neck: No carotid bruits. JVD not elevated.  Lungs: Respirations regular and unlabored, without wheezes or rales.  Heart: Regular rate and rhythm. No S3 or S4.  No murmur, no rubs, or gallops appreciated. Abdomen: Soft, non-tender, non-distended with normoactive bowel sounds. No hepatomegaly. No rebound/guarding. No obvious abdominal masses. Msk:  Strength and tone appear normal for age. No joint deformities or effusions. Extremities: No clubbing or cyanosis. No lower extremity edema.  Distal pedal pulses are 2+ bilaterally. Neuro: Alert and oriented X 3. Moves all extremities spontaneously. No focal deficits noted. Psych:  Responds to questions appropriately with a normal affect. Skin: No rashes or lesions noted  Wt Readings from Last 3 Encounters:  09/05/16 195 lb 12.8 oz (88.8 kg)  08/15/16 192 lb 6.4 oz (87.3 kg)   04/01/16 197 lb 9.6 oz (89.6 kg)     Studies/Labs Reviewed:   EKG:  EKG is ordered today.  The ekg ordered today demonstrates NSR, HR 85, with no acute ST or T-wave changes.   Recent Labs: 11/10/2015: Hemoglobin 11.8 08/15/2016: BUN 12; Creatinine, Ser 0.71; Platelets 331; Potassium 4.3; Sodium 139; TSH 1.340   Lipid Panel    Component Value Date/Time   CHOL 187 04/06/2015 0250   TRIG 34 04/06/2015 0250   HDL 61 04/06/2015 0250   CHOLHDL 3.1 04/06/2015 0250   VLDL 7 04/06/2015 0250   LDLCALC 119 (H) 04/06/2015 0250    Additional studies/ records that were reviewed today include:   Echocardiogram: 04/06/2015 Study Conclusions  - Left ventricle: The cavity size was normal. Wall thickness was   normal. Systolic function was normal. The estimated ejection   fraction was in the range of 60% to 65%. Wall motion  was normal;   there were no regional wall motion abnormalities. Doppler   parameters are consistent with abnormal left ventricular   relaxation (grade 1 diastolic dysfunction). - Aortic valve: There was no stenosis. - Mitral valve: There was no significant regurgitation. - Right ventricle: The cavity size was normal. Systolic function   was normal. - Pulmonary arteries: No complete TR doppler jet so unable to   estimate PA systolic pressure. - Inferior vena cava: The vessel was normal in size. The   respirophasic diameter changes were in the normal range (>= 50%),   consistent with normal central venous pressure. - Pericardium, extracardiac: A trivial pericardial effusion was   identified posterior to the heart.  Impressions:  - Normal LV size with EF 60-65%. Normal RV size and systolic   function. No significant valvular abnormalities.  Carotid Dopplers: 08/31/2014 Summary:  - The vertebral arteries appear patent with antegrade flow. - Findings consistent with 40 - 59 percent stenosis involving the   right internal carotid artery, based on velocities. There  is no   plaque morphology to support stenosis. - Findings consistent with 1- 39 percent stenosis involving the   left internal carotid artery. - ICA/CCA ratio. right = 1.64. left = 1.15   Assessment:    1. Palpitations   2. Transient cerebral ischemia, unspecified type   3. Stenosis of right carotid artery      Plan:   In order of problems listed above:  1. Palpitations - the patient reports having episodes of palpitations occurring since her TIA in 2016 which have recently increased in frequency and severity. Episodes can last for 5-10 minutes and spontaneously resolve. She denies any associated symptoms.  - recent echo in 2016 showed an EF of 60-65% with no wall motion abnormalities. Recent labs on 08/15/2016 with WBC of 3.6, Hgb 12.2, platelets 331. K+ 4.3. Creatinine 0.71. TSH 1.34. - will obtain a 30-day cardiac event monitor in the setting of her worsening palpitations and prior TIA. She is already on Toprol-XL 50mg  daily and will continue this for now. May require further dose adjustments but will await the results of her monitor prior to dose adjustment. This assessment and plan was discussed with Dr. Sallyanne Kuster (DOD) who was in agreement with the above.    2. History of TIA - occurring in 2016, etiology unknown.  - plan for 30-day cardiac event monitor. - remains on Plavix and statin therapy.   3. Carotid Artery Stenosis - carotid dopplers in 08/2014 showed 40-59% RICA stenosis and 3-50% LICA stenosis. - continue Atorvastatin and Plavix.   Medication Adjustments/Labs and Tests Ordered: Current medicines are reviewed at length with the patient today.  Concerns regarding medicines are outlined above.  Medication changes, Labs and Tests ordered today are listed in the Patient Instructions below. Patient Instructions  Medication Instructions: No changes  Procedures/Testing: Your physician has recommended that you wear an 30 day event monitor. Event monitors are medical  devices that record the heart's electrical activity. Doctors most often [place] these monitors to diagnose arrhythmias. Arrhythmias are problems with the speed or rhythm of the heartbeat. The monitor is a small, portable device. You can wear one while you do your normal daily activities. This is usually used to diagnose what is causing palpitations/syncope (passing out). This will be done at [address], [address]  Follow-Up: Your physician wants you to follow-up in: 6 months with Dr. Sallyanne Kuster. You will receive a reminder letter in the mail two months in advance.  If you don't receive a letter, please call our office to schedule the follow-up appointment.  If you need a refill on your cardiac medications before your next appointment, please call your pharmacy.   Signed, Erma Heritage, PA-C  09/05/2016 4:06 PM    [facility] [place], [place], [place]  [address] Phone: [phone]; Fax: [phone]  [address], [place], [place] [address] Phone: [phone]

## 2016-09-05 NOTE — Progress Notes (Deleted)
PCP: Maren Reamer, MD  Clinic Note: No chief complaint on file.   HPI: Ruth Conley is a 51 y.o. female with a PMH notable for hypertension, TIA and CVA below who is being seen today for the evaluation of palpitations at the request of Maren Reamer, MD.  Ruth Conley was seen on April 16 by her PCP - apparently she notes occasional palpitations. She drinks lots of water and green tea. Apparently she was scheduled to have an event monitor placed back in December 2016 by her neurologist but never done.  Recent Hospitalizations: ***  Studies Personally Reviewed - if available, images/films reviewed: From Epic Chart or Care Everywhere -  2-D echo December 2016: Normal LV size and thickness. EF 60-65%. GR 1 DD. No wall motion abnormality. No source of embolism noted  Interval History: ***   No chest pain or shortness of breath with rest or exertion.  No PND, orthopnea or edema. No palpitations, lightheadedness, dizziness, weakness or syncope/near syncope. No TIA/amaurosis fugax symptoms. No melena, hematochezia, hematuria, or epstaxis. No claudication.  ROS: A comprehensive was performed. ROS   I have reviewed and (if needed) personally updated the patient's problem list, medications, allergies, past medical and surgical history, social and family history.   Past Medical History:  Diagnosis Date  . Gestational diabetes   . Hypertension   . Stroke [facility])    TIA  . TIA (transient ischemic attack)     Past Surgical History:  Procedure Laterality Date  . CESAREAN SECTION      No outpatient prescriptions have been marked as taking for the 09/05/16 encounter (Appointment) with Leonie Man, MD.    Allergies  Allergen Reactions  . Lisinopril Cough    Social History   Social History  . Marital status: Legally Separated    Spouse name: N/A  . Number of children: N/A  . Years of education: N/A   Social History Main Topics  . Smoking status: Never  Smoker  . Smokeless tobacco: Never Used  . Alcohol use No  . Drug use: No  . Sexual activity: Not on file   Other Topics Concern  . Not on file   Social History Narrative  . No narrative on file    family history includes Stroke in her paternal uncle.  Wt Readings from Last 3 Encounters:  08/15/16 192 lb 6.4 oz (87.3 kg)  04/01/16 197 lb 9.6 oz (89.6 kg)  11/10/15 206 lb 12.8 oz (93.8 kg)    PHYSICAL EXAM There were no vitals taken for this visit. General appearance: alert, cooperative, appears stated age, no distress and *** obese Neck: no adenopathy, no carotid bruit and no JVD Lungs: clear to auscultation bilaterally, normal percussion bilaterally and non-labored Heart: regular rate and rhythm, S1, S2 normal, no murmur, click, rub or gallop *** Abdomen: soft, non-tender; bowel sounds normal; no masses,  no organomegaly; *** Extremities: extremities normal, atraumatic, no cyanosis, or edema *** Pulses: 2+ and symmetric; *** Skin: {normal findings:33173} Neurologic: Mental status: Alert, oriented, thought content appropriate; *** mood & affect Cranial nerves: normal (II-XII grossly intact)    Adult ECG Report  Rate: *** ;  Rhythm: {rhythm:17366};   Narrative Interpretation: ***   Other studies Reviewed: Additional studies/ records that were reviewed today include:  Recent Labs:  ***  Lab Results  Component Value Date   CHOL 187 04/06/2015   HDL 61 04/06/2015   LDLCALC 119 (H) 04/06/2015   TRIG 34 04/06/2015  CHOLHDL 3.1 04/06/2015   Lab Results  Component Value Date   HGBA1C 5.3 08/15/2016   Lab Results  Component Value Date   CREATININE 0.71 08/15/2016   BUN 12 08/15/2016   NA 139 08/15/2016   K 4.3 08/15/2016   CL 104 08/15/2016   CO2 22 08/15/2016    ASSESSMENT / PLAN: Problem List Items Addressed This Visit    None      Current medicines are reviewed at length with the patient today. (+/- concerns) *** The following changes have been  made: ***  There are no Patient Instructions on file for this visit.  Studies Ordered:   No orders of the defined types were placed in this encounter.     Glenetta Hew, M.D., M.S. Interventional Cardiologist   Pager # [phone] Phone # [phone] [address] [place], [place] [address]

## 2016-09-05 NOTE — Patient Instructions (Signed)
Medication Instructions: No changes   Procedures/Testing: Your physician has recommended that you wear an 30 day event monitor. Event monitors are medical devices that record the heart's electrical activity. Doctors most often [place] these monitors to diagnose arrhythmias. Arrhythmias are problems with the speed or rhythm of the heartbeat. The monitor is a small, portable device. You can wear one while you do your normal daily activities. This is usually used to diagnose what is causing palpitations/syncope (passing out). This will be done at [address], [address]   Follow-Up: Your physician wants you to follow-up in: 6 months with Dr. Sallyanne Kuster. You will receive a reminder letter in the mail two months in advance. If you don't receive a letter, please call our office to schedule the follow-up appointment.   If you need a refill on your cardiac medications before your next appointment, please call your pharmacy.

## 2016-09-15 ENCOUNTER — Encounter: Payer: Self-pay | Admitting: Internal Medicine

## 2016-09-15 ENCOUNTER — Other Ambulatory Visit: Payer: Self-pay | Admitting: Internal Medicine

## 2016-09-15 MED FILL — FERROUS SULFATE 325 MG TAB: 325 (65 FE) | 60 days supply | Qty: 30 | Fill #2

## 2016-09-15 MED FILL — CLOPIDOGREL 75 MG TABLET: 75 | 90 days supply | Qty: 90 | Fill #0

## 2016-09-15 MED FILL — ?ATORVASTATIN 10 MG TABLET: 10 | 90 days supply | Qty: 90 | Fill #0

## 2016-09-15 MED FILL — METOPROLOL SUCC ER 50 MG TA: 50 | 30 days supply | Qty: 30 | Fill #1

## 2016-09-16 ENCOUNTER — Encounter: Payer: Self-pay | Admitting: Internal Medicine

## 2016-09-19 ENCOUNTER — Ambulatory Visit (INDEPENDENT_AMBULATORY_CARE_PROVIDER_SITE_OTHER): Payer: BLUE CROSS/BLUE SHIELD

## 2016-09-19 ENCOUNTER — Encounter (INDEPENDENT_AMBULATORY_CARE_PROVIDER_SITE_OTHER): Payer: Self-pay

## 2016-09-19 ENCOUNTER — Encounter: Payer: Self-pay | Admitting: Internal Medicine

## 2016-09-19 DIAGNOSIS — R002 Palpitations: Secondary | ICD-10-CM | POA: Diagnosis not present

## 2016-09-19 DIAGNOSIS — G459 Transient cerebral ischemic attack, unspecified: Secondary | ICD-10-CM

## 2016-10-12 ENCOUNTER — Ambulatory Visit: Payer: No Typology Code available for payment source | Admitting: Neurology

## 2016-10-19 MED FILL — METOPROLOL SUCC ER 50 MG TA: 50 | 90 days supply | Qty: 90 | Fill #2

## 2016-10-20 MED FILL — ?HYDROCHLOROTHIAZIDE 12.5MG: 12.5 | 90 days supply | Qty: 90 | Fill #1

## 2016-10-25 ENCOUNTER — Ambulatory Visit: Payer: BLUE CROSS/BLUE SHIELD

## 2016-10-31 ENCOUNTER — Telehealth: Payer: Self-pay

## 2016-10-31 NOTE — Telephone Encounter (Signed)
-----   Message from Rosalin Hawking, MD sent at 10/30/2016  7:30 AM EDT ----- Could you please let the patient know that the cardiac monitoring test done recently was normal, no irregular heart beat. Please continue current treatment. Thanks.  Rosalin Hawking, MD PhD Stroke Neurology 10/30/2016 7:29 AM

## 2016-10-31 NOTE — Telephone Encounter (Signed)
Rn spoke with patient that her cardiac monitor was normal. NO irregular heart beat. Continue treatment plan. Pt verbalized understanding

## 2016-11-07 ENCOUNTER — Ambulatory Visit: Payer: No Typology Code available for payment source | Admitting: Neurology

## 2016-12-19 ENCOUNTER — Ambulatory Visit (INDEPENDENT_AMBULATORY_CARE_PROVIDER_SITE_OTHER): Payer: BLUE CROSS/BLUE SHIELD | Admitting: Neurology

## 2016-12-19 ENCOUNTER — Encounter: Payer: Self-pay | Admitting: Neurology

## 2016-12-19 VITALS — BP 110/72 | HR 79 | Wt 202.2 lb

## 2016-12-19 DIAGNOSIS — E785 Hyperlipidemia, unspecified: Secondary | ICD-10-CM

## 2016-12-19 DIAGNOSIS — I10 Essential (primary) hypertension: Secondary | ICD-10-CM | POA: Diagnosis not present

## 2016-12-19 DIAGNOSIS — I63531 Cerebral infarction due to unspecified occlusion or stenosis of right posterior cerebral artery: Secondary | ICD-10-CM

## 2016-12-19 MED FILL — ATORVASTATIN 10 MG TABLET: 10 | 90 days supply | Qty: 90 | Fill #1

## 2016-12-19 MED FILL — CLOPIDOGREL 75 MG TABLET: 75 | 90 days supply | Qty: 90 | Fill #1

## 2016-12-19 NOTE — Progress Notes (Signed)
STROKE NEUROLOGY FOLLOW UP NOTE  NAME: Lacresia Yearsley DOB: December 04, 1965  REASON FOR VISIT: stroke follow up HISTORY FROM: pt and chart  Today we had the pleasure of seeing Jerrye Cranfield in follow-up at our Neurology Clinic. Pt was accompanied by no one.   History Summary  Ms. Modesty Kehm is a 51 y.o. female with history of HTN, elevated LDL, TIA 08/2014 (right sided symptoms) admitted on 04/05/15 for left sided tingling and left leg weakness. She did not receive IV t-PA due to lack of objective neurologic exam on arrival. MRI showed right thalamic small infarct. Other stroke work up including MRA,head and neck, 2D echo and A1C and hypercoagulable and autoimmune work up were all negative except LDL 119. Her ASA was changed to plavix, and lipitor 10 was added. Pt admitted palpitation once a month and her HR during admission up to 130s, lisinopril changed to metoprolol. She was discharged in good condition with recommendation of 30 day monitoring as outpt.   Of note, pt had transient right sided numbness in 08/2014, was admitted to [facility] for TIA work up. All work up were normal except CUS showed right ICA 40-59% stenosis and it is asymptomatic side. She was put on ASA at that time.   04/15/15 follow up - the patient has been doing well. Her left facial mild numbness improved. Left hand numbness feeling is coming back. Left leg wobbly resolved. Reported frontal area mild headache with tension at neck and back of head. Admitted anxiety due to recent stroke. Admitted snoring but no apnea. Denies any smoking, illicit drugs or use OCP. BP today is high in clinic 152/103, but not check BP at home. In doctors office before 130/80. Recently started exercise program  07/21/15 follow up - pt has been doing well without stroke like symptoms. Reported easy bruise on the skin with plavix. Sleep study done showed no evidence of OSA. 30 day cardiac monitoring not done yet due to insurance issue. Reported some  facial and hand numbness when tired or stressed out. On metoprolol, not having much palpitation recently. BP today 129/88.  04/01/16 follow up - pt has been doing well. Has not had 30 day monitoring yet due to insurance coverage. She is changing job now and will likely to be able to have it done in 3 months. Still has some facial and hand numbness when tired or stressed out. BP 129/88 today. Pt stated that her palpitation usually happens with anxiety and back to bed at night after bathroom use.   Interval History  During the interval time, pt has been doing well. Had 30 day cardiac event monitoring and no afib found. Pt felt well and has lost 40lbs intentionally. On lipitor and plavix and intermittent bruising. BP well controlled. 110/72 today.   REVIEW OF SYSTEMS: Full 14 system review of systems performed and notable only for those listed below and in HPI above, all others are negative:   Constitutional:  Cardiovascular:  Ear/Nose/Throat:  Skin:  Eyes:  Respiratory:  Gastroitestinal:  Genitourinary:  Hematology/Lymphatic:  Endocrine:  Musculoskeletal:  Allergy/Immunology:  Neurological:  Psychiatric:  Sleep:   The following represents the patient's updated allergies and side effects list:  No Known Allergies   The neurologically relevant items on the patient's problem list were reviewed on today's visit.   Neurologic Examination  A problem focused neurological exam (12 or more points of the single system neurologic examination, vital signs counts as 1 point, cranial nerves count for 8 points) was  performed.  Last menstrual period 04/04/2015.  General - well nourished, well developed, in no apparent distress.  Ophthalmologic - Sharp disc margins OU.  Cardiovascular - Regular rate and rhythm.  Mental Status -  Level of arousal and orientation to time, place, and person were intact.  Language including expression, naming, repetition, comprehension was assessed and found intact.    Attention span and concentration were normal.  Recent and remote memory were intact.  Fund of Knowledge was assessed and was intact.  Cranial Nerves II - XII -  II - Visual field intact OU.  III, IV, VI - Extraocular movements intact.  V - Facial sensation intact bilaterally.  VII - Facial movement intact bilaterally.  VIII - Hearing & vestibular intact bilaterally.  X - Palate elevates symmetrically.  XI - Chin turning & shoulder shrug intact bilaterally.  XII - Tongue protrusion intact.  Motor Strength - The patient's strength was normal in all extremities and pronator drift was absent. Bulk was normal and fasciculations were absent.  Motor Tone - Muscle tone was assessed at the neck and appendages and was normal.  Reflexes - The patient's reflexes were 1+ in all extremities and she had no pathological reflexes.  Sensory - Light touch, temperature/pinprick, vibration and proprioception, and Romberg testing were assessed and were normal.  Coordination - The patient had normal movements in the hands and feet with no ataxia or dysmetria. Tremor was absent.  Gait and Station - The patient's transfers, posture, gait, station, and turns were observed as normal.   Data reviewed:  I personally reviewed the images and agree with the radiology interpretations.   CUS 08/31/14 - - The vertebral arteries appear patent with antegrade flow.  - Findings consistent with 40 - 59 percent stenosis involving the  right internal carotid artery, based on velocities. There is no  plaque morphology to support stenosis.  - Findings consistent with 1- 39 percent stenosis involving the  left internal carotid artery.  - ICA/CCA ratio. right = 1.64. left = 1.15   08/30/14 MRI and MRA  MRA head: Normal.  MRI head: No acute or significant finding. Few tiny white matter foci and not likely of clinical relevance.   Ct Head Wo Contrast  04/05/2015 No acute intracranial pathology.   Mr Angiogram Neck Wo Contrast   04/06/2015 Normal noncontrast MRA of the neck. Given patient's history of RIGHT carotid stenosis, consider follow-up examination with contrast versus carotid ultrasound if contraindicated.   Mr Brain Wo Contrast  04/06/2015 Subcentimeter acute ischemia RIGHT thalamus. Stable mild white matter changes most compatible chronic small vessel ischemic disease.   Mr Jodene Nam Head/brain Wo Cm  04/06/2015 Normal MRA head.   2D echo - - Left ventricle: The cavity size was normal. Wall thickness was normal. Systolic function was normal. The estimated ejection fraction was in the range of 60% to 65%. Wall motion was normal; there were no regional wall motion abnormalities. Doppler parameters are consistent with abnormal left ventricular relaxation (grade 1 diastolic dysfunction). - Aortic valve: There was no stenosis. - Mitral valve: There was no significant regurgitation. - Right ventricle: The cavity size was normal. Systolic function was normal. - Pulmonary arteries: No complete TR doppler jet so unable to estimate PA systolic pressure. - Inferior vena cava: The vessel was normal in size. The respirophasic diameter changes were in the normal range (>= 50%), consistent with normal central venous pressure. - Pericardium, extracardiac: A trivial pericardial effusion was identified posterior to the heart. Impressions: -  Normal LV size with EF 60-65%. Normal RV size and systolic function. No significant valvular abnormalities.   Sleep study - no evidence of OSA  30 day cardiac event monitoring - no afib  Component Latest Ref Rng  08/30/2014  04/03/2015  04/06/2015   Cholesterol 0 - 200 mg/dL   181  187   Triglycerides <150 mg/dL   63  34   HDL Cholesterol >40 mg/dL   59  61   Total CHOL/HDL Ratio   3.1  3.1   VLDL 0 - 40 mg/dL   13  7   LDL (calc) 0 - 99 mg/dL   109  119 (H)   C-ANCA Neg:<1:20 titer    <1:20   P-ANCA Neg:<1:20 titer    <1:20   Atypical P-ANCA titer Neg:<1:20 titer     <1:20   PTT Lupus Anticoagulant 0.0 - 40.6 sec    35.3   DRVVT 0.0 - 44.0 sec    36.3   Lupus Anticoag Interp    Comment:   Beta-2 Glyco I IgG 0 - 20 GPI IgG units    <9   Beta-2-Glycoprotein I IgM 0 - 32 GPI IgM units    <9   Beta-2-Glycoprotein I IgA 0 - 25 GPI IgA units    <9   Anticardiolipin IgG 0 - 14 GPL U/mL    <9   Anticardiolipin IgM 0 - 12 MPL U/mL    <9   Anticardiolipin IgA 0 - 11 APL U/mL    <9   Hemoglobin A1C 4.8 - 5.6 %  6.0 (H)   5.6   Mean Plasma Glucose  126   114   Recommendations-F5LEID:    Comment   Comment    Comment   Recommendations-PTGENE:    Comment   Additional Information    Comment   TSH 0.350 - 4.500 uIU/mL  1.456     ds DNA Ab 0 - 9 IU/mL    <1   SSA (Ro) (ENA) Antibody, IgG 0.0 - 0.9 AI    <0.2   SSB (La) (ENA) Antibody, IgG 0.0 - 0.9 AI    <0.2   RPR Non Reactive    Non Reactive   Sed Rate 0 - 22 mm/hr    12   HIV Non Reactive    Non Reactive   Compl, Total (CH50) 42 - 60 U/mL    > 60   ANA Ab, IFA    Negative   C3 Complement 82 - 167 mg/dL    122   Complement C4, Body Fluid 14 - 44 mg/dL    33   Antithrombin Activity 75 - 120 %    95   Protein C Activity 73 - 180 %    91   Protein C, Total 60 - 150 %    85   Protein S Activity 63 - 140 %    83   Protein S Ag, Total 60 - 150 %    96   Homocysteine 0.0 - 15.0 umol/L    4.9   Rhuematoid fact SerPl-aCnc 0.0 - 13.9 IU/mL    <10.0   Sickle Cell Screen Negative    Negative    Assessment: As you may recall, she is a 51 y.o. African American female with PMH of HTN and HLD was admitted in 08/2014 for TIA of transient right sided numbness. Work up negative and put on ASA. Admitted again in 04/2015 for left sided numbness, MRI showed right  thalamic small infarct. Other stroke work up including MRA,head and neck, 2D echo and A1C and hypercoagulable and autoimmune work up were all negative except LDL 119. Her ASA was changed to plavix, and lipitor 10 was added. She was  discharged in good condition. During the interval time, she has been doing well. Had sleepy study showed no evidence of OSA. Reported intermittent palpitation with anxiety and movement, and 30 day cardiac event monitoring negative. Will need to rule out PFO.  Plan:  - continue plavix and lipitor for stroke prevention - check BP at home and recorder - will do TCD bubble study to rule out PFO - healthy diet and regular exercise. - Follow up with your primary care physician for stroke risk factor modification. Recommend maintain blood pressure goal 120-140/70-90 and lipids with LDL cholesterol goal below 70 mg/dL.  - follow up in 4 months   No orders of the defined types were placed in this encounter.   No orders of the defined types were placed in this encounter.   Patient Instructions  - continue plavix and lipitor for stroke prevention - check BP at home and recorder - will do TCD bubble study to rule out PFO - healthy diet and regular exercise. - Follow up with your primary care physician for stroke risk factor modification. Recommend maintain blood pressure goal 120-140/70-90 and lipids with LDL cholesterol goal below 70 mg/dL.  - follow up in 4 months   Rosalin Hawking, MD PhD [facility] [address], [place], [place] [address] [phone]

## 2016-12-19 NOTE — Patient Instructions (Signed)
-   continue plavix and lipitor for stroke prevention - check BP at home and recorder - will do TCD bubble study to rule out PFO - healthy diet and regular exercise. - Follow up with your primary care physician for stroke risk factor modification. Recommend maintain blood pressure goal 120-140/70-90 and lipids with LDL cholesterol goal below 70 mg/dL.  - follow up in 4 months

## 2016-12-20 ENCOUNTER — Telehealth: Payer: Self-pay | Admitting: Neurology

## 2016-12-20 NOTE — Telephone Encounter (Signed)
Patient is scheduled Monday 12/26/2016 arrive at 1:45 pm - No restrictions .   for VAS [place] TRANSCRANIAL DOPPLER W BUBBLES . Patient 's insurance no authorization required.  Dr. Erlinda Hong you will be contacted when its time for bubbles. Thanks Hinton Dyer. I have called and left patient a message asking her to call me back.

## 2016-12-20 NOTE — Telephone Encounter (Signed)
Thank you so much, Hinton Dyer.  Rosalin Hawking, MD PhD Stroke Neurology 12/20/2016 4:56 PM

## 2016-12-26 ENCOUNTER — Ambulatory Visit (HOSPITAL_COMMUNITY): Payer: BLUE CROSS/BLUE SHIELD

## 2016-12-29 ENCOUNTER — Other Ambulatory Visit (HOSPITAL_COMMUNITY): Payer: BLUE CROSS/BLUE SHIELD

## 2017-01-10 MED FILL — HYDROCHLOROTHIAZIDE 12.5 MG: 12.5 | 90 days supply | Qty: 90 | Fill #2

## 2017-01-10 MED FILL — METOPROLOL SUCC ER 50 MG TA: 50 | 90 days supply | Qty: 90 | Fill #3

## 2017-03-16 MED FILL — ?CLOPIDOGREL 75MG TAB: 75 | 30 days supply | Qty: 30 | Fill #2

## 2017-03-20 MED FILL — ?ATORVASTATIN 10 MG TABLET: 10 | 30 days supply | Qty: 30 | Fill #2 | Status: TO

## 2017-04-12 MED FILL — METOPROLOL SUCC ER 50 MG TA: 50 | 30 days supply | Qty: 30 | Fill #4

## 2017-04-12 MED FILL — HYDROCHLOROTHIAZIDE 12.5 MG: 12.5 | 30 days supply | Qty: 30 | Fill #3

## 2017-04-12 MED FILL — ?CLOPIDOGREL 75MG TAB: 75 | 30 days supply | Qty: 30 | Fill #3

## 2017-04-20 ENCOUNTER — Ambulatory Visit: Payer: BLUE CROSS/BLUE SHIELD | Admitting: Internal Medicine

## 2017-05-04 MED FILL — ?HYDROCHLOROTHIAZIDE 12.5MG: 12.5 | 30 days supply | Qty: 30 | Fill #4

## 2017-05-08 ENCOUNTER — Ambulatory Visit: Payer: BLUE CROSS/BLUE SHIELD | Admitting: Neurology

## 2017-05-08 ENCOUNTER — Telehealth: Payer: Self-pay

## 2017-05-08 NOTE — Telephone Encounter (Signed)
Patient was no show for appt today.

## 2017-05-09 ENCOUNTER — Encounter: Payer: Self-pay | Admitting: Neurology

## 2017-05-17 ENCOUNTER — Ambulatory Visit: Payer: BLUE CROSS/BLUE SHIELD | Attending: Internal Medicine | Admitting: Nurse Practitioner

## 2017-05-17 ENCOUNTER — Encounter: Payer: Self-pay | Admitting: Nurse Practitioner

## 2017-05-17 VITALS — BP 133/74 | HR 106 | Temp 98.6°F | Resp 12 | Ht 67.0 in | Wt 211.2 lb

## 2017-05-17 DIAGNOSIS — Z8673 Personal history of transient ischemic attack (TIA), and cerebral infarction without residual deficits: Secondary | ICD-10-CM | POA: Diagnosis not present

## 2017-05-17 DIAGNOSIS — E669 Obesity, unspecified: Secondary | ICD-10-CM

## 2017-05-17 DIAGNOSIS — Z9889 Other specified postprocedural states: Secondary | ICD-10-CM | POA: Insufficient documentation

## 2017-05-17 DIAGNOSIS — Z8249 Family history of ischemic heart disease and other diseases of the circulatory system: Secondary | ICD-10-CM | POA: Insufficient documentation

## 2017-05-17 DIAGNOSIS — Z6833 Body mass index (BMI) 33.0-33.9, adult: Secondary | ICD-10-CM | POA: Diagnosis not present

## 2017-05-17 DIAGNOSIS — I63531 Cerebral infarction due to unspecified occlusion or stenosis of right posterior cerebral artery: Secondary | ICD-10-CM

## 2017-05-17 DIAGNOSIS — E782 Mixed hyperlipidemia: Secondary | ICD-10-CM | POA: Diagnosis not present

## 2017-05-17 DIAGNOSIS — R000 Tachycardia, unspecified: Secondary | ICD-10-CM | POA: Diagnosis not present

## 2017-05-17 DIAGNOSIS — Z823 Family history of stroke: Secondary | ICD-10-CM | POA: Insufficient documentation

## 2017-05-17 DIAGNOSIS — Z79899 Other long term (current) drug therapy: Secondary | ICD-10-CM | POA: Diagnosis not present

## 2017-05-17 DIAGNOSIS — I10 Essential (primary) hypertension: Secondary | ICD-10-CM

## 2017-05-17 MED ORDER — HYDROCHLOROTHIAZIDE 12.5 MG PO CAPS
12.5000 mg | ORAL_CAPSULE | Freq: Every day | ORAL | 3 refills | Status: DC
Start: 2017-05-17 — End: 2018-01-31

## 2017-05-17 MED ORDER — ATORVASTATIN CALCIUM 10 MG PO TABS: ORAL_TABLET | ORAL | 2 refills | Status: DC

## 2017-05-17 MED ORDER — CLOPIDOGREL BISULFATE 75 MG PO TABS: 75.0000 mg | ORAL_TABLET | Freq: Every day | ORAL | 2 refills | Status: DC

## 2017-05-17 MED ORDER — METOPROLOL SUCCINATE ER 50 MG PO TB24: 50.0000 mg | ORAL_TABLET | Freq: Every day | ORAL | 2 refills | Status: DC

## 2017-05-17 MED FILL — METOPROLOL SUCCINATE ER 50: 50 | 30 days supply | Qty: 30 | Fill #0

## 2017-05-17 MED FILL — ?CLOPIDOGREL 75MG TABLET: 75 | 30 days supply | Qty: 30 | Fill #0

## 2017-05-17 MED FILL — ?ATORVASTATIN 10 MG TABLET: 10 | 30 days supply | Qty: 30 | Fill #0

## 2017-05-17 NOTE — Patient Instructions (Addendum)
DASH Eating Plan DASH stands for "Dietary Approaches to Stop Hypertension." The DASH eating plan is a healthy eating plan that has been shown to reduce high blood pressure (hypertension). It may also reduce your risk for type 2 diabetes, heart disease, and stroke. The DASH eating plan may also help with weight loss. What are tips for following this plan? General guidelines  Avoid eating more than 2,300 mg (milligrams) of salt (sodium) a day. If you have hypertension, you may need to reduce your sodium intake to 1,500 mg a day.  Limit alcohol intake to no more than 1 drink a day for nonpregnant women and 2 drinks a day for men. One drink equals 12 oz of beer, 5 oz of wine, or 1 oz of hard liquor.  Work with your health care provider to maintain a healthy body weight or to lose weight. Ask what an ideal weight is for you.  Get at least 30 minutes of exercise that causes your heart to beat faster (aerobic exercise) most days of the week. Activities may include walking, swimming, or biking.  Work with your health care provider or diet and nutrition specialist (dietitian) to adjust your eating plan to your individual calorie needs. Reading food labels  Check food labels for the amount of sodium per serving. Choose foods with less than 5 percent of the Daily Value of sodium. Generally, foods with less than 300 mg of sodium per serving fit into this eating plan.  To find whole grains, look for the word "whole" as the first word in the ingredient list. Shopping  Buy products labeled as "low-sodium" or "no salt added."  Buy fresh foods. Avoid canned foods and premade or frozen meals. Cooking  Avoid adding salt when cooking. Use salt-free seasonings or herbs instead of table salt or sea salt. Check with your health care provider or pharmacist before using salt substitutes.  Do not fry foods. Cook foods using healthy methods such as baking, boiling, grilling, and broiling instead.  Cook with  heart-healthy oils, such as olive, canola, soybean, or sunflower oil. Meal planning   Eat a balanced diet that includes: ? 5 or more servings of fruits and vegetables each day. At each meal, try to fill half of your plate with fruits and vegetables. ? Up to 6-8 servings of whole grains each day. ? Less than 6 oz of lean meat, poultry, or fish each day. A 3-oz serving of meat is about the same size as a deck of cards. One egg equals 1 oz. ? 2 servings of low-fat dairy each day. ? A serving of nuts, seeds, or beans 5 times each week. ? Heart-healthy fats. Healthy fats called Omega-3 fatty acids are found in foods such as flaxseeds and coldwater fish, like sardines, salmon, and mackerel.  Limit how much you eat of the following: ? Canned or prepackaged foods. ? Food that is high in trans fat, such as fried foods. ? Food that is high in saturated fat, such as fatty meat. ? Sweets, desserts, sugary drinks, and other foods with added sugar. ? Full-fat dairy products.  Do not salt foods before eating.  Try to eat at least 2 vegetarian meals each week.  Eat more home-cooked food and less restaurant, buffet, and fast food.  When eating at a restaurant, ask that your food be prepared with less salt or no salt, if possible. What foods are recommended? The items listed may not be a complete list. Talk with your dietitian about what   dietary choices are best for you. Grains Whole-grain or whole-wheat bread. Whole-grain or whole-wheat pasta. Brown rice. Oatmeal. Quinoa. Bulgur. Whole-grain and low-sodium cereals. Pita bread. Low-fat, low-sodium crackers. Whole-wheat flour tortillas. Vegetables Fresh or frozen vegetables (raw, steamed, roasted, or grilled). Low-sodium or reduced-sodium tomato and vegetable juice. Low-sodium or reduced-sodium tomato sauce and tomato paste. Low-sodium or reduced-sodium canned vegetables. Fruits All fresh, dried, or frozen fruit. Canned fruit in natural juice (without  added sugar). Meat and other protein foods Skinless chicken or turkey. Ground chicken or turkey. Pork with fat trimmed off. Fish and seafood. Egg whites. Dried beans, peas, or lentils. Unsalted nuts, nut butters, and seeds. Unsalted canned beans. Lean cuts of beef with fat trimmed off. Low-sodium, lean deli meat. Dairy Low-fat (1%) or fat-free (skim) milk. Fat-free, low-fat, or reduced-fat cheeses. Nonfat, low-sodium ricotta or cottage cheese. Low-fat or nonfat yogurt. Low-fat, low-sodium cheese. Fats and oils Soft margarine without trans fats. Vegetable oil. Low-fat, reduced-fat, or light mayonnaise and salad dressings (reduced-sodium). Canola, safflower, olive, soybean, and sunflower oils. Avocado. Seasoning and other foods Herbs. Spices. Seasoning mixes without salt. Unsalted popcorn and pretzels. Fat-free sweets. What foods are not recommended? The items listed may not be a complete list. Talk with your dietitian about what dietary choices are best for you. Grains Baked goods made with fat, such as croissants, muffins, or some breads. Dry pasta or rice meal packs. Vegetables Creamed or fried vegetables. Vegetables in a cheese sauce. Regular canned vegetables (not low-sodium or reduced-sodium). Regular canned tomato sauce and paste (not low-sodium or reduced-sodium). Regular tomato and vegetable juice (not low-sodium or reduced-sodium). Pickles. Olives. Fruits Canned fruit in a light or heavy syrup. Fried fruit. Fruit in cream or butter sauce. Meat and other protein foods Fatty cuts of meat. Ribs. Fried meat. Bacon. Sausage. Bologna and other processed lunch meats. Salami. Fatback. Hotdogs. Bratwurst. Salted nuts and seeds. Canned beans with added salt. Canned or smoked fish. Whole eggs or egg yolks. Chicken or turkey with skin. Dairy Whole or 2% milk, cream, and half-and-half. Whole or full-fat cream cheese. Whole-fat or sweetened yogurt. Full-fat cheese. Nondairy creamers. Whipped toppings.  Processed cheese and cheese spreads. Fats and oils Butter. Stick margarine. Lard. Shortening. Ghee. Bacon fat. Tropical oils, such as coconut, palm kernel, or palm oil. Seasoning and other foods Salted popcorn and pretzels. Onion salt, garlic salt, seasoned salt, table salt, and sea salt. Worcestershire sauce. Tartar sauce. Barbecue sauce. Teriyaki sauce. Soy sauce, including reduced-sodium. Steak sauce. Canned and packaged gravies. Fish sauce. Oyster sauce. Cocktail sauce. Horseradish that you find on the shelf. Ketchup. Mustard. Meat flavorings and tenderizers. Bouillon cubes. Hot sauce and Tabasco sauce. Premade or packaged marinades. Premade or packaged taco seasonings. Relishes. Regular salad dressings. Where to find more information:  National Heart, Lung, and Blood Institute: www.nhlbi.nih.gov  American Heart Association: www.heart.org Summary  The DASH eating plan is a healthy eating plan that has been shown to reduce high blood pressure (hypertension). It may also reduce your risk for type 2 diabetes, heart disease, and stroke.  With the DASH eating plan, you should limit salt (sodium) intake to 2,300 mg a day. If you have hypertension, you may need to reduce your sodium intake to 1,500 mg a day.  When on the DASH eating plan, aim to eat more fresh fruits and vegetables, whole grains, lean proteins, low-fat dairy, and heart-healthy fats.  Work with your health care provider or diet and nutrition specialist (dietitian) to adjust your eating plan to your individual   calorie needs. This information is not intended to replace advice given to you by your health care provider. Make sure you discuss any questions you have with your health care provider. Document Released: 04/07/2011 Document Revised: 04/11/2016 Document Reviewed: 04/11/2016 Elsevier Interactive Patient Education  2018 [place] Eating Plan DASH stands for "Dietary Approaches to Stop Hypertension." The DASH eating  plan is a healthy eating plan that has been shown to reduce high blood pressure (hypertension). It may also reduce your risk for type 2 diabetes, heart disease, and stroke. The DASH eating plan may also help with weight loss. What are tips for following this plan? General guidelines  Avoid eating more than 2,300 mg (milligrams) of salt (sodium) a day. If you have hypertension, you may need to reduce your sodium intake to 1,500 mg a day.  Limit alcohol intake to no more than 1 drink a day for nonpregnant women and 2 drinks a day for men. One drink equals 12 oz of beer, 5 oz of wine, or 1 oz of hard liquor.  Work with your health care provider to maintain a healthy body weight or to lose weight. Ask what an ideal weight is for you.  Get at least 30 minutes of exercise that causes your heart to beat faster (aerobic exercise) most days of the week. Activities may include walking, swimming, or biking.  Work with your health care provider or diet and nutrition specialist (dietitian) to adjust your eating plan to your individual calorie needs. Reading food labels  Check food labels for the amount of sodium per serving. Choose foods with less than 5 percent of the Daily Value of sodium. Generally, foods with less than 300 mg of sodium per serving fit into this eating plan.  To find whole grains, look for the word "whole" as the first word in the ingredient list. Shopping  Buy products labeled as "low-sodium" or "no salt added."  Buy fresh foods. Avoid canned foods and premade or frozen meals. Cooking  Avoid adding salt when cooking. Use salt-free seasonings or herbs instead of table salt or sea salt. Check with your health care provider or pharmacist before using salt substitutes.  Do not fry foods. Cook foods using healthy methods such as baking, boiling, grilling, and broiling instead.  Cook with heart-healthy oils, such as olive, canola, soybean, or sunflower oil. Meal planning   Eat a  balanced diet that includes: ? 5 or more servings of fruits and vegetables each day. At each meal, try to fill half of your plate with fruits and vegetables. ? Up to 6-8 servings of whole grains each day. ? Less than 6 oz of lean meat, poultry, or fish each day. A 3-oz serving of meat is about the same size as a deck of cards. One egg equals 1 oz. ? 2 servings of low-fat dairy each day. ? A serving of nuts, seeds, or beans 5 times each week. ? Heart-healthy fats. Healthy fats called Omega-3 fatty acids are found in foods such as flaxseeds and coldwater fish, like sardines, salmon, and mackerel.  Limit how much you eat of the following: ? Canned or prepackaged foods. ? Food that is high in trans fat, such as fried foods. ? Food that is high in saturated fat, such as fatty meat. ? Sweets, desserts, sugary drinks, and other foods with added sugar. ? Full-fat dairy products.  Do not salt foods before eating.  Try to eat at least 2 vegetarian meals each week.  Eat more home-cooked food and less restaurant, buffet, and fast food.  When eating at a restaurant, ask that your food be prepared with less salt or no salt, if possible. What foods are recommended? The items listed may not be a complete list. Talk with your dietitian about what dietary choices are best for you. Grains Whole-grain or whole-wheat bread. Whole-grain or whole-wheat pasta. Brown rice. [place]. Bulgur. Whole-grain and low-sodium cereals. Pita bread. Low-fat, low-sodium crackers. Whole-wheat flour tortillas. Vegetables Fresh or frozen vegetables (raw, steamed, roasted, or grilled). Low-sodium or reduced-sodium tomato and vegetable juice. Low-sodium or reduced-sodium tomato sauce and tomato paste. Low-sodium or reduced-sodium canned vegetables. Fruits All fresh, dried, or frozen fruit. Canned fruit in natural juice (without added sugar). Meat and other protein foods Skinless chicken or [place]. Ground chicken or  [place]. Pork with fat trimmed off. Fish and seafood. Egg whites. Dried beans, peas, or lentils. Unsalted nuts, nut butters, and seeds. Unsalted canned beans. Lean cuts of beef with fat trimmed off. Low-sodium, lean deli meat. Dairy Low-fat (1%) or fat-free (skim) milk. Fat-free, low-fat, or reduced-fat cheeses. Nonfat, low-sodium ricotta or cottage cheese. Low-fat or nonfat yogurt. Low-fat, low-sodium cheese. Fats and oils Soft margarine without trans fats. Vegetable oil. Low-fat, reduced-fat, or light mayonnaise and salad dressings (reduced-sodium). Canola, safflower, olive, soybean, and sunflower oils. Avocado. Seasoning and other foods Herbs. Spices. Seasoning mixes without salt. Unsalted popcorn and pretzels. Fat-free sweets. What foods are not recommended? The items listed may not be a complete list. Talk with your dietitian about what dietary choices are best for you. Grains Baked goods made with fat, such as croissants, muffins, or some breads. Dry pasta or rice meal packs. Vegetables Creamed or fried vegetables. Vegetables in a cheese sauce. Regular canned vegetables (not low-sodium or reduced-sodium). Regular canned tomato sauce and paste (not low-sodium or reduced-sodium). Regular tomato and vegetable juice (not low-sodium or reduced-sodium). Angie Fava. Olives. Fruits Canned fruit in a light or heavy syrup. Fried fruit. Fruit in cream or butter sauce. Meat and other protein foods Fatty cuts of meat. Ribs. Fried meat. Berniece Salines. Sausage. Bologna and other processed lunch meats. Salami. Fatback. Hotdogs. Bratwurst. Salted nuts and seeds. Canned beans with added salt. Canned or smoked fish. Whole eggs or egg yolks. Chicken or [place] with skin. Dairy Whole or 2% milk, cream, and half-and-half. Whole or full-fat cream cheese. Whole-fat or sweetened yogurt. Full-fat cheese. Nondairy creamers. Whipped toppings. Processed cheese and cheese spreads. Fats and oils Butter. Stick margarine. Lard.  Shortening. Ghee. Bacon fat. Tropical oils, such as coconut, palm kernel, or palm oil. Seasoning and other foods Salted popcorn and pretzels. Onion salt, garlic salt, seasoned salt, table salt, and sea salt. Worcestershire sauce. Tartar sauce. Barbecue sauce. Teriyaki sauce. Soy sauce, including reduced-sodium. Steak sauce. Canned and packaged gravies. Fish sauce. Oyster sauce. Cocktail sauce. Horseradish that you find on the shelf. Ketchup. Mustard. Meat flavorings and tenderizers. Bouillon cubes. Hot sauce and Tabasco sauce. Premade or packaged marinades. Premade or packaged taco seasonings. Relishes. Regular salad dressings. Where to find more information:  National Heart, Lung, and [place]: https://wilson-eaton.com/  American Heart Association: www.heart.org Summary  The DASH eating plan is a healthy eating plan that has been shown to reduce high blood pressure (hypertension). It may also reduce your risk for type 2 diabetes, heart disease, and stroke.  With the DASH eating plan, you should limit salt (sodium) intake to 2,300 mg a day. If you have hypertension, you may need to reduce your sodium intake  to 1,500 mg a day.  When on the DASH eating plan, aim to eat more fresh fruits and vegetables, whole grains, lean proteins, low-fat dairy, and heart-healthy fats.  Work with your health care provider or diet and nutrition specialist (dietitian) to adjust your eating plan to your individual calorie needs. This information is not intended to replace advice given to you by your health care provider. Make sure you discuss any questions you have with your health care provider. Document Released: 04/07/2011 Document Revised: 04/11/2016 Document Reviewed: 04/11/2016 Elsevier Interactive Patient Education  2018 [employer].  Dyslipidemia Dyslipidemia is an imbalance of waxy, fat-like substances (lipids) in the blood. The body needs lipids in small amounts. Dyslipidemia often involves a high level  of cholesterol or triglycerides, which are types of lipids. Common forms of dyslipidemia include:  High levels of bad cholesterol (LDL cholesterol). LDL is the type of cholesterol that causes fatty deposits (plaques) to build up in the blood vessels that carry blood away from your heart (arteries).  Low levels of good cholesterol (HDL cholesterol). HDL cholesterol is the type of cholesterol that protects against heart disease. High levels of HDL remove the LDL buildup from arteries.  High levels of triglycerides. Triglycerides are a fatty substance in the blood that is linked to a buildup of plaques in the arteries.  You can develop dyslipidemia because of the genes you are born with (primary dyslipidemia) or changes that occur during your life (secondary dyslipidemia), or as a side effect of certain medical treatments. What are the causes? Primary dyslipidemia is caused by changes (mutations) in genes that are passed down through families (inherited). These mutations cause several types of dyslipidemia. Mutations can result in disorders that make the body produce too much LDL cholesterol or triglycerides, or not enough HDL cholesterol. These disorders may lead to heart disease, arterial disease, or stroke at an early age. Causes of secondary dyslipidemia include certain lifestyle choices and diseases that lead to dyslipidemia, such as:  Eating a diet that is high in animal fat.  Not getting enough activity or exercise (having a sedentary lifestyle).  Having diabetes, kidney disease, liver disease, or thyroid disease.  Drinking large amounts of alcohol.  Using certain types of drugs.  What increases the risk? You may be at greater risk for dyslipidemia if you are an older man or if you are a woman who has gone through menopause. Other risk factors include:  Having a family history of dyslipidemia.  Taking certain medicines, including birth control pills, steroids, some diuretics,  beta-blockers, and some medicines forHIV.  Smoking cigarettes.  Eating a high-fat diet.  Drinking large amounts of alcohol.  Having certain medical conditions such as diabetes, polycystic ovary syndrome (PCOS), pregnancy, kidney disease, liver disease, or hypothyroidism.  Not exercising regularly.  Being overweight or obese with too much belly fat.  What are the signs or symptoms? Dyslipidemia does not usually cause any symptoms. Very high lipid levels can cause fatty bumps under the skin (xanthomas) or a white or gray ring around the black center (pupil) of the eye. Very high triglyceride levels can cause inflammation of the pancreas (pancreatitis). How is this diagnosed? Your health care provider may diagnose dyslipidemia based on a routine blood test (fasting blood test). Because most people do not have symptoms of the condition, this blood testing (lipid profile) is done on adults age 31 and older and is repeated every 5 years. This test checks:  Total cholesterol. This is a measure of the  total amount of cholesterol in your blood, including LDL cholesterol, HDL cholesterol, and triglycerides. A healthy number is below 200.  LDL cholesterol. The target number for LDL cholesterol is different for each person, depending on individual risk factors. For most people, a number below 100 is healthy. Ask your health care provider what your LDL cholesterol number should be.  HDL cholesterol. An HDL level of 60 or higher is best because it helps to protect against heart disease. A number below 96 for men or below 77 for women increases the risk for heart disease.  Triglycerides. A healthy triglyceride number is below 150.  If your lipid profile is abnormal, your health care provider may do other blood tests to get more information about your condition. How is this treated? Treatment depends on the type of dyslipidemia that you have and your other risk factors for heart disease and stroke.  Your health care provider will have a target range for your lipid levels based on this information. For many people, treatment starts with lifestyle changes, such as diet and exercise. Your health care provider may recommend that you:  Get regular exercise.  Make changes to your diet.  Quit smoking if you smoke.  If diet changes and exercise do not help you reach your goals, your health care provider may also prescribe medicine to lower lipids. The most commonly prescribed type of medicine lowers your LDL cholesterol (statin drug). If you have a high triglyceride level, your provider may prescribe another type of drug (fibrate) or an omega-3 fish oil supplement, or both. Follow these instructions at home:  Take over-the-counter and prescription medicines only as told by your health care provider. This includes supplements.  Get regular exercise. Start an aerobic exercise and strength training program as told by your health care provider. Ask your health care provider what activities are safe for you. Your health care provider may recommend: ? 30 minutes of aerobic activity 4-6 days a week. Brisk walking is an example of aerobic activity. ? Strength training 2 days a week.  Eat a healthy diet as told by your health care provider. This can help you reach and maintain a healthy weight, lower your LDL cholesterol, and raise your HDL cholesterol. It may help to work with a diet and nutrition specialist (dietitian) to make a plan that is right for you. Your dietitian or health care provider may recommend: ? Limiting your calories, if you are overweight. ? Eating more fruits, vegetables, whole grains, fish, and lean meats. ? Limiting saturated fat, trans fat, and cholesterol.  Follow instructions from your health care provider or dietitian about eating or drinking restrictions.  Limit alcohol intake to no more than one drink per day for nonpregnant women and two drinks per day for men. One drink  equals 12 oz of beer, 5 oz of wine, or 1 oz of hard liquor.  Do not use any products that contain nicotine or tobacco, such as cigarettes and e-cigarettes. If you need help quitting, ask your health care provider.  Keep all follow-up visits as told by your health care provider. This is important. Contact a health care provider if:  You are having trouble sticking to your exercise or diet plan.  You are struggling to quit smoking or control your use of alcohol. Summary  Dyslipidemia is an imbalance of waxy, fat-like substances (lipids) in the blood. The body needs lipids in small amounts. Dyslipidemia often involves a high level of cholesterol or triglycerides, which are types  of lipids.  Treatment depends on the type of dyslipidemia that you have and your other risk factors for heart disease and stroke.  For many people, treatment starts with lifestyle changes, such as diet and exercise. Your health care provider may also prescribe medicine to lower lipids. This information is not intended to replace advice given to you by your health care provider. Make sure you discuss any questions you have with your health care provider. Document Released: 04/23/2013 Document Revised: 12/14/2015 Document Reviewed: 12/14/2015 Elsevier Interactive Patient Education  2018 [employer].  Iron Deficiency Anemia, Adult Iron-deficiency anemia is when you have a low amount of red blood cells or hemoglobin. This happens because you have too little iron in your body. Hemoglobin carries oxygen to parts of the body. Anemia can cause your body to not get enough oxygen. It may or may not cause symptoms. Follow these instructions at home: Medicines  Take over-the-counter and prescription medicines only as told by your doctor. This includes iron pills (supplements) and vitamins.  If you cannot handle taking iron pills by mouth, ask your doctor about getting iron through: ? A vein (intravenously). ? A shot  (injection) into a muscle.  Take iron pills when your stomach is empty. If you cannot handle this, take them with food.  Do not drink milk or take antacids at the same time as your iron pills.  To prevent trouble pooping (constipation), eat fiber or take medicine (stool softener) as told by your doctor. Eating and drinking  Talk with your doctor before changing the foods you eat. He or she may tell you to eat foods that have a lot of iron, such as: ? Liver. ? Lowfat (lean) beef. ? Breads and cereals that have iron added to them (fortified breads and cereals). ? Eggs. ? Dried fruit. ? Dark green, leafy vegetables.  Drink enough fluid to keep your pee (urine) clear or pale yellow.  Eat fresh fruits and vegetables that are high in vitamin C. They help your body to use iron. Foods with a lot of vitamin C include: ? Oranges. ? Peppers. ? Tomatoes. ? Mangoes. General instructions  Return to your normal activities as told by your doctor. Ask your doctor what activities are safe for you.  Keep yourself clean, and keep things clean around you (your surroundings). Anemia can make you get sick more easily.  Keep all follow-up visits as told by your doctor. This is important. Contact a doctor if:  You feel sick to your stomach (nauseous).  You throw up (vomit).  You feel weak.  You are sweating for no clear reason.  You have trouble pooping, such as: ? Pooping (having a bowel movement) less than 3 times a week. ? Straining to poop. ? Having poop that is hard, dry, or larger than normal. ? Feeling full or bloated. ? Pain in the lower belly. ? Not feeling better after pooping. Get help right away if:  You pass out (faint). If this happens, do not drive yourself to the hospital. Call your local emergency services (911 in the [place]  You have chest pain.  You have shortness of breath that: ? Is very bad. ? Gets worse with physical activity.  You have a fast heartbeat.  You  get light-headed when getting up from sitting or lying down. This information is not intended to replace advice given to you by your health care provider. Make sure you discuss any questions you have with your health care provider. Document  Released: 05/21/2010 Document Revised: 01/06/2016 Document Reviewed: 01/06/2016 Elsevier Interactive Patient Education  2017 Elsevier Inc.  Sinus Tachycardia Sinus tachycardia is a kind of fast heartbeat. In sinus tachycardia, the heart beats more than 100 times a minute. Sinus tachycardia starts in a part of the heart called the sinus node. Sinus tachycardia may be harmless, or it may be a sign of a serious condition. What are the causes? This condition may be caused by:  Exercise or exertion.  A fever.  Pain.  Loss of body fluids (dehydration).  Severe bleeding (hemorrhage).  Anxiety and stress.  Certain substances, including: ? Alcohol. ? Caffeine. ? Tobacco and nicotine products. ? Diet pills. ? Illegal drugs.  Medical conditions including: ? Heart disease. ? An infection. ? An overactive thyroid (hyperthyroidism). ? A lack of red blood cells (anemia).  What are the signs or symptoms? Symptoms of this condition include:  A feeling that the heart is beating quickly (palpitations).  Suddenly noticing your heartbeat (cardiac awareness).  Dizziness.  Tiredness (fatigue).  Shortness of breath.  Chest pain.  Nausea.  Fainting.  How is this diagnosed? This condition is diagnosed with:  A physical exam.  Other tests, such as: ? Blood tests. ? An electrocardiogram (ECG). This test measures the electrical activity of the heart. ? Holter monitoring. For this test, you wear a device that records your heartbeat for one or more days.  You may be referred to a heart specialist (cardiologist). How is this treated? Treatment for this condition depends on the cause or underlying condition. Treatment may involve:  Treating the  underlying condition.  Taking new medicines or changing your current medicines as told by your health care provider.  Making changes to your diet or lifestyle.  Practicing relaxation methods.  Follow these instructions at home: Lifestyle  Do not use any products that contain nicotine or tobacco, such as cigarettes and e-cigarettes. If you need help quitting, ask your health care provider.  Learn relaxation methods, like deep breathing, to help you when you get stressed or anxious.  Do not use illegal drugs, such as cocaine.  Do not abuse alcohol. Limit alcohol intake to no more than 1 drink a day for non-pregnant women and 2 drinks a day for men. One drink equals 12 oz of beer, 5 oz of wine, or 1 oz of hard liquor.  Find time to rest and relax often. This reduces stress.  Avoid: ? Caffeine. ? Stimulants such as over-the-counter diet pills or pills that help you to stay awake. ? Situations that cause anxiety or stress. General instructions  Drink enough fluids to keep your urine clear or pale yellow.  Take over-the-counter and prescription medicines only as told by your health care provider.  Keep all follow-up visits as told by your health care provider. This is important. Contact a health care provider if:  You have a fever.  You have vomiting or diarrhea that keeps happening (is persistent). Get help right away if:  You have pain in your chest, upper arms, jaw, or neck.  You become weak or dizzy.  You feel faint.  You have palpitations that do not go away. This information is not intended to replace advice given to you by your health care provider. Make sure you discuss any questions you have with your health care provider. Document Released: 05/26/2004 Document Revised: 11/14/2015 Document Reviewed: 10/31/2014 Elsevier Interactive Patient Education  [employer].

## 2017-05-17 NOTE — Progress Notes (Signed)
Assessment & Plan:  Ruth Conley was seen today for establish care.  Diagnoses and all orders for this visit:  Essential hypertension -     hydrochlorothiazide (MICROZIDE) 12.5 MG capsule; Take 1 capsule (12.5 mg total) by mouth daily. -     metoprolol succinate (TOPROL-XL) 50 MG 24 hr tablet; Take 1 tablet (50 mg total) by mouth daily. Take with or immediately following a meal. INSTRUCTIONS; Continue all antihypertensives as prescribed.  Remember to bring in your blood pressure log with you for your follow up appointment.  DASH/Mediterranean Diets are healthier choices for HTN.   History of TIA (transient ischemic attack) -     clopidogrel (PLAVIX) 75 MG tablet; Take 1 tablet (75 mg total) by mouth daily. -     atorvastatin (LIPITOR) 10 MG tablet; TAKE 1 TABLET BY MOUTH DAILY AT 6 PM. INSTRUCTIONS; PLEASE MAKE A F/U APPT WITH NEUROLOGY AS SOON AS POSSIBLE  Cerebrovascular accident (CVA) due to occlusion of right posterior cerebral artery (HCC) -     clopidogrel (PLAVIX) 75 MG tablet; Take 1 tablet (75 mg total) by mouth daily. -     atorvastatin (LIPITOR) 10 MG tablet; TAKE 1 TABLET BY MOUTH DAILY AT 6 PM.  Mixed hyperlipidemia -     atorvastatin (LIPITOR) 10 MG tablet; TAKE 1 TABLET BY MOUTH DAILY AT 6 PM. INSTRUCTIONS; Work on a low fat, heart healthy diet and participate in regular aerobic exercise program to control as well by working out at least 150 minutes per week. No fried foods. No junk foods, sodas, sugary drinks, unhealthy snacking, or smoking.   Tachycardia -     metoprolol succinate (TOPROL-XL) 50 MG 24 hr tablet; Take 1 tablet (50 mg total) by mouth daily. Take with or immediately following a meal. Patient instructed to [place]. IF  HR >100 on 2 consecutive readings; WILL CALL OFFICE (MAY NEED TO INCREASE TOPROL XL).   OBESITY Discussed diet and exercise for person with BMI >25. Instructed: You must burn more calories than you eat.  Losing 5 percent of your body weight should be considered a success. In the longer term, losing more than 15 percent of your body weight and staying at this weight is an extremely good result. However, keep in mind that even losing 5 percent of your body weight leads to important health benefits, so try not to get discouraged if you're not able to lose more than this. Will recheck weight in 3-6 months.   Patient has been counseled on age-appropriate routine health concerns for screening and prevention. These are reviewed and up-to-date. Referrals have been placed accordingly. Immunizations are up-to-date or declined.    Subjective:   Chief Complaint  Patient presents with  . Establish Care    Patient is here to establish care. Patient need medication refills.    HPI Ruth Conley 52 y.o. female presents to office today to establish care. She has a history of HTN, elevated LDL, TIA (08-2014) and R Thalamic infarct 04-2015.   Essential Hypertension Chronic. Stable. Endorses medication and diet compliance although she is very overweight I am not sure how forthcoming she is regarding diet compliance. Goal blood pressure 120-130/70-90. She has been at goal during office visits. Checks blood pressure at home 3x week. Blood pressure average 118-120/70-80s. Reports HR readings on monitor 70-80s. Going to gym 5x weeks; 30 minute circuit. Denies chest pain, shortness of breath, palpitations, lightheadedness, dizziness, headaches or BLE edema. She is tachycardic today.  BP Readings from Last 3 Encounters:  05/17/17 133/74  12/19/16 110/72  09/05/16 134/90    History of TIA/Stroke Taking Plavix and Lipitor for stroke prevention. LDL goal <70. Sees Neurology. Last appt 11-2016. She was a no show for follow up appt this month. She reports she did call the office to cancel however they did not get the message. She endorses heart palpitations only with anxiety. Denies any current stroke like  symptoms.  Hyperlipidemia Chronic. Stable. Taking Lipitor 10mg  daily. Risk factors for heart disease:obesity, weight and hypertension.  Lab Results  Component Value Date   CHOL 187 04/06/2015   CHOL 181 04/03/2015   CHOL 183 08/30/2014   Lab Results  Component Value Date   HDL 61 04/06/2015   HDL 59 04/03/2015   HDL 56 08/30/2014   Lab Results  Component Value Date   LDLCALC 119 (H) 04/06/2015   LDLCALC 109 04/03/2015   LDLCALC 114 (H) 08/30/2014   Lab Results  Component Value Date   TRIG 34 04/06/2015   TRIG 63 04/03/2015   TRIG 67 08/30/2014   Lab Results  Component Value Date   CHOLHDL 3.1 04/06/2015   CHOLHDL 3.1 04/03/2015   CHOLHDL 3.3 08/30/2014     Review of Systems  Constitutional: Negative for fever, malaise/fatigue and weight loss.  HENT: Negative.  Negative for nosebleeds.   Eyes: Negative.  Negative for blurred vision, double vision and photophobia.  Respiratory: Negative.  Negative for cough and shortness of breath.   Cardiovascular: Negative.  Negative for chest pain, palpitations and leg swelling.  Gastrointestinal: Negative.  Negative for abdominal pain, constipation, diarrhea, heartburn, nausea and vomiting.  Musculoskeletal: Negative.  Negative for myalgias.  Neurological: Negative.  Negative for dizziness, focal weakness, seizures and headaches.  Endo/Heme/Allergies: Negative for environmental allergies.  Psychiatric/Behavioral: Negative.  Negative for suicidal ideas.    Past Medical History:  Diagnosis Date  . Carotid stenosis    a. 08/2014: dopplers showing 40-59% RICA stenosis and 6-19% LICA stenosis  . Hypertension   . Stroke ([place])   . TIA (transient ischemic attack) 2016    Past Surgical History:  Procedure Laterality Date  . CESAREAN SECTION    . TRANSTHORACIC ECHOCARDIOGRAM  04/2015   (no change from 08/2014) Normal LV size and thickness. EF 60-65%. GR 1 DD. No wall motion abnormality. No source of embolism noted    Family  History  Problem Relation Age of Onset  . Hypertension Mother   . CAD Father 45  . Stroke Paternal Uncle     Social History Reviewed with no changes to be made today.   Outpatient Medications Prior to Visit  Medication Sig Dispense Refill  . atorvastatin (LIPITOR) 10 MG tablet TAKE 1 TABLET BY MOUTH DAILY AT 6 PM. 90 tablet 2  . clopidogrel (PLAVIX) 75 MG tablet Take 1 tablet (75 mg total) by mouth daily. 90 tablet 2  . hydrochlorothiazide (MICROZIDE) 12.5 MG capsule Take 1 capsule (12.5 mg total) by mouth daily. 90 capsule 3  . metoprolol succinate (TOPROL-XL) 50 MG 24 hr tablet Take 1 tablet (50 mg total) by mouth daily. Take with or immediately following a meal. 90 tablet 2  . Coenzyme Q-10 100 MG capsule Take 100 mg by mouth daily. Reported on 11/10/2015    . ferrous sulfate 325 (65 FE) MG tablet TAKE 1 TABLET BY MOUTH EVERY OTHER DAY. (Patient not taking: Reported on 05/17/2017) 30 tablet 2  . Multiple Vitamins-Minerals (MULTIVITAMIN WITH MINERALS) tablet Take 1 tablet  by mouth daily.    . Omega 3 1200 MG CAPS Take 600 mg by mouth daily.    . fluticasone (FLONASE) 50 MCG/ACT nasal spray Place 2 sprays into both nostrils daily. (Patient not taking: Reported on 05/17/2017) 16 g 3   No facility-administered medications prior to visit.     Allergies  Allergen Reactions  . Lisinopril Cough       Objective:    BP 133/74 (BP Location: Left Arm, Patient Position: Sitting, Cuff Size: Normal)   Pulse (!) 106   Temp 98.6 F (37 C) (Oral)   Resp 12   Ht 5\' 7"  (1.702 m)   Wt 211 lb 3.2 oz (95.8 kg)   SpO2 100%   BMI 33.08 kg/m  Wt Readings from Last 3 Encounters:  05/17/17 211 lb 3.2 oz (95.8 kg)  12/19/16 202 lb 3.2 oz (91.7 kg)  09/05/16 195 lb 12.8 oz (88.8 kg)    Physical Exam  Constitutional: She is oriented to person, place, and time. She appears well-developed and well-nourished. She is cooperative.  HENT:  Head: Normocephalic and atraumatic.  Eyes: EOM are normal.   Neck: Normal range of motion.  Cardiovascular: Regular rhythm, normal heart sounds and intact distal pulses. Tachycardia present. Exam reveals no gallop and no friction rub.  No murmur heard. Pulmonary/Chest: Effort normal and breath sounds normal. No tachypnea. No respiratory distress. She has no decreased breath sounds. She has no wheezes. She has no rhonchi. She has no rales. She exhibits no tenderness.  Abdominal: Soft. Bowel sounds are normal.  Musculoskeletal: Normal range of motion. She exhibits no edema.  Neurological: She is alert and oriented to person, place, and time. Coordination normal.  Skin: Skin is warm and dry.  Psychiatric: She has a normal mood and affect. Her behavior is normal. Judgment and thought content normal.  Nursing note and vitals reviewed.     Patient has been counseled extensively about nutrition and exercise as well as the importance of adherence with medications and regular follow-up. The patient was given clear instructions to go to ER or return to medical center if symptoms don't improve, worsen or new problems develop. The patient verbalized understanding.   Follow-up: Return in about 3 months (around 08/15/2017) for FASTING labs and Physical.   Gildardo Pounds, FNP-BC [facility] and [place], [place]   05/21/2017, 9:39 PM

## 2017-05-21 ENCOUNTER — Encounter: Payer: Self-pay | Admitting: Nurse Practitioner

## 2017-05-22 ENCOUNTER — Ambulatory Visit: Payer: BLUE CROSS/BLUE SHIELD | Admitting: Nurse Practitioner

## 2017-06-09 MED FILL — ?CLOPIDOGREL 75MG TABLET: 75 | 30 days supply | Qty: 30 | Fill #1

## 2017-06-09 MED FILL — ?ATORVASTATIN 10 MG TABLET: 10 | 30 days supply | Qty: 30 | Fill #1

## 2017-06-09 MED FILL — ?HYDROCHLOROTHIAZIDE 12.5MG: 12.5 | 30 days supply | Qty: 30 | Fill #5

## 2017-06-09 MED FILL — METOPROLOL SUCCINATE ER 50: 50 | 30 days supply | Qty: 30 | Fill #1

## 2017-07-12 MED FILL — METOPROLOL SUCCINATE ER 50: 50 | 30 days supply | Qty: 30 | Fill #2

## 2017-07-12 MED FILL — ATORVASTATIN 10 MG TABLET: 10 | 30 days supply | Qty: 30 | Fill #2

## 2017-07-12 MED FILL — CLOPIDOGREL 75 MG TABLET: 75 | 30 days supply | Qty: 30 | Fill #2

## 2017-07-12 MED FILL — HYDROCHLOROTHIAZIDE 12.5 MG: 12.5 | 30 days supply | Qty: 30 | Fill #6

## 2017-08-08 MED FILL — HYDROCHLOROTHIAZIDE 12.5 MG: 12.5 | 90 days supply | Qty: 90 | Fill #0

## 2017-08-08 MED FILL — ATORVASTATIN 10 MG TABLET: 10 | 90 days supply | Qty: 90 | Fill #3

## 2017-08-08 MED FILL — CLOPIDOGREL 75 MG TABLET: 75 | 90 days supply | Qty: 90 | Fill #3

## 2017-08-08 MED FILL — METOPROLOL SUCCINATE ER 50: 50 | 90 days supply | Qty: 90 | Fill #3

## 2017-08-21 ENCOUNTER — Encounter: Payer: BLUE CROSS/BLUE SHIELD | Admitting: Nurse Practitioner

## 2017-09-18 ENCOUNTER — Ambulatory Visit (INDEPENDENT_AMBULATORY_CARE_PROVIDER_SITE_OTHER): Payer: BLUE CROSS/BLUE SHIELD | Admitting: Nurse Practitioner

## 2017-11-07 MED FILL — ATORVASTATIN 10 MG TABLET: 10 | 90 days supply | Qty: 90 | Fill #4

## 2017-11-07 MED FILL — HYDROCHLOROTHIAZIDE 12.5 MG: 12.5 | 90 days supply | Qty: 90 | Fill #1

## 2017-11-07 MED FILL — METOPROLOL SUCCINATE ER 50: 50 | 90 days supply | Qty: 90 | Fill #4

## 2017-11-07 MED FILL — CLOPIDOGREL 75 MG TABLET: 75 | 90 days supply | Qty: 90 | Fill #4

## 2017-12-07 ENCOUNTER — Telehealth: Payer: Self-pay | Admitting: Family Medicine

## 2017-12-07 NOTE — Telephone Encounter (Signed)
Patient

## 2017-12-13 ENCOUNTER — Ambulatory Visit: Payer: BLUE CROSS/BLUE SHIELD | Admitting: Nurse Practitioner

## 2018-01-10 ENCOUNTER — Ambulatory Visit: Payer: BLUE CROSS/BLUE SHIELD | Admitting: Obstetrics and Gynecology

## 2018-01-12 ENCOUNTER — Ambulatory Visit: Payer: BLUE CROSS/BLUE SHIELD | Admitting: Nurse Practitioner

## 2018-01-31 ENCOUNTER — Encounter: Payer: Self-pay | Admitting: Nurse Practitioner

## 2018-01-31 ENCOUNTER — Ambulatory Visit: Payer: BLUE CROSS/BLUE SHIELD | Attending: Nurse Practitioner | Admitting: Nurse Practitioner

## 2018-01-31 VITALS — BP 126/83 | HR 97 | Temp 98.3°F | Ht 67.0 in | Wt 195.8 lb

## 2018-01-31 DIAGNOSIS — Z8249 Family history of ischemic heart disease and other diseases of the circulatory system: Secondary | ICD-10-CM | POA: Insufficient documentation

## 2018-01-31 DIAGNOSIS — Z7902 Long term (current) use of antithrombotics/antiplatelets: Secondary | ICD-10-CM | POA: Insufficient documentation

## 2018-01-31 DIAGNOSIS — E559 Vitamin D deficiency, unspecified: Secondary | ICD-10-CM | POA: Diagnosis not present

## 2018-01-31 DIAGNOSIS — Z79899 Other long term (current) drug therapy: Secondary | ICD-10-CM | POA: Insufficient documentation

## 2018-01-31 DIAGNOSIS — Z888 Allergy status to other drugs, medicaments and biological substances status: Secondary | ICD-10-CM | POA: Insufficient documentation

## 2018-01-31 DIAGNOSIS — Z8673 Personal history of transient ischemic attack (TIA), and cerebral infarction without residual deficits: Secondary | ICD-10-CM | POA: Diagnosis not present

## 2018-01-31 DIAGNOSIS — I10 Essential (primary) hypertension: Secondary | ICD-10-CM | POA: Diagnosis not present

## 2018-01-31 DIAGNOSIS — Z0000 Encounter for general adult medical examination without abnormal findings: Secondary | ICD-10-CM | POA: Insufficient documentation

## 2018-01-31 DIAGNOSIS — I63531 Cerebral infarction due to unspecified occlusion or stenosis of right posterior cerebral artery: Secondary | ICD-10-CM

## 2018-01-31 DIAGNOSIS — R000 Tachycardia, unspecified: Secondary | ICD-10-CM

## 2018-01-31 DIAGNOSIS — Z1231 Encounter for screening mammogram for malignant neoplasm of breast: Secondary | ICD-10-CM

## 2018-01-31 DIAGNOSIS — E782 Mixed hyperlipidemia: Secondary | ICD-10-CM | POA: Diagnosis not present

## 2018-01-31 DIAGNOSIS — D500 Iron deficiency anemia secondary to blood loss (chronic): Secondary | ICD-10-CM | POA: Diagnosis not present

## 2018-01-31 DIAGNOSIS — Z1211 Encounter for screening for malignant neoplasm of colon: Secondary | ICD-10-CM

## 2018-01-31 MED ORDER — ATORVASTATIN CALCIUM 10 MG PO TABS: ORAL_TABLET | ORAL | 2 refills | Status: DC

## 2018-01-31 MED ORDER — CLOPIDOGREL BISULFATE 75 MG PO TABS: 75.0000 mg | ORAL_TABLET | Freq: Every day | ORAL | 2 refills | Status: DC

## 2018-01-31 MED ORDER — FERROUS SULFATE 325 (65 FE) MG PO TABS: 325.0000 mg | ORAL_TABLET | ORAL | 2 refills | Status: DC

## 2018-01-31 MED ORDER — METOPROLOL SUCCINATE ER 50 MG PO TB24: 50.0000 mg | ORAL_TABLET | Freq: Every day | ORAL | 2 refills | Status: DC

## 2018-01-31 MED ORDER — HYDROCHLOROTHIAZIDE 12.5 MG PO CAPS
12.5000 mg | ORAL_CAPSULE | Freq: Every day | ORAL | 3 refills | Status: DC
Start: 2018-01-31 — End: 2019-01-30

## 2018-01-31 MED FILL — ATORVASTATIN 10 MG TABLET: 10 | 90 days supply | Qty: 90 | Fill #0

## 2018-01-31 MED FILL — FERROUS SULFATE 325 MG TAB: 325 (65 FE) | 90 days supply | Qty: 90 | Fill #0

## 2018-01-31 MED FILL — CLOPIDOGREL 75 MG TABLET: 75 | 90 days supply | Qty: 90 | Fill #0

## 2018-01-31 MED FILL — HYDROCHLOROTHIAZIDE 12.5 MG: 12.5 | 90 days supply | Qty: 90 | Fill #0

## 2018-01-31 MED FILL — METOPROLOL SUCCINATE ER 50: 50 | 90 days supply | Qty: 90 | Fill #0

## 2018-01-31 NOTE — Progress Notes (Signed)
Assessment & Plan:  Ruth Conley was seen today for annual exam.  Diagnoses and all orders for this visit:  Well woman exam without gynecological exam She has a list of several essential oils that she is using topically as well as taking p.o.  I have instructed her to stop using ginger, cinnamon, grapefruit, and vitamin D due to possible reactions with her Plavix.  She verbalized understanding.  History of TIA (transient ischemic attack) -     atorvastatin (LIPITOR) 10 MG tablet; TAKE 1 TABLET BY MOUTH DAILY AT 6 PM. -     clopidogrel (PLAVIX) 75 MG tablet; Take 1 tablet (75 mg total) by mouth daily.  Cerebrovascular accident (CVA) due to occlusion of right posterior cerebral artery (HCC) -     atorvastatin (LIPITOR) 10 MG tablet; TAKE 1 TABLET BY MOUTH DAILY AT 6 PM. -     clopidogrel (PLAVIX) 75 MG tablet; Take 1 tablet (75 mg total) by mouth daily. -     CBC -     CMP14+EGFR Continue to follow-up with neurology as instructed.  Mixed hyperlipidemia -     atorvastatin (LIPITOR) 10 MG tablet; TAKE 1 TABLET BY MOUTH DAILY AT 6 PM. -     Lipid panel INSTRUCTIONS: Work on a low fat, heart healthy diet and participate in regular aerobic exercise program by working out at least 150 minutes per week; 5 days a week-30 minutes per day. Avoid red meat, fried foods. junk foods, sodas, sugary drinks, unhealthy snacking, alcohol and smoking.  Drink at least 48oz of water per day and monitor your carbohydrate intake daily.    Essential hypertension -     metoprolol succinate (TOPROL-XL) 50 MG 24 hr tablet; Take 1 tablet (50 mg total) by mouth daily. Take with or immediately following a meal. -     hydrochlorothiazide (MICROZIDE) 12.5 MG capsule; Take 1 capsule (12.5 mg total) by mouth daily. Continue all antihypertensives as prescribed.  Remember to bring in your blood pressure log with you for your follow up appointment.  DASH/Mediterranean Diets are healthier choices for HTN.    Tachycardia -      metoprolol succinate (TOPROL-XL) 50 MG 24 hr tablet; Take 1 tablet (50 mg total) by mouth daily. Take with or immediately following a meal.  Vitamin D deficiency -     VITAMIN D 25 Hydroxy (Vit-D Deficiency, Fractures)  Breast cancer screening by mammogram -     MM DIGITAL SCREENING BILATERAL; Future  Iron deficiency anemia due to chronic blood loss -     ferrous sulfate 325 (65 FE) MG tablet; Take 1 tablet (325 mg total) by mouth every other day. She has an upcoming appointment with gynecology to discuss her fibroids and abnormal uterine bleeding.    Patient has been counseled on age-appropriate routine health concerns for screening and prevention. These are reviewed and up-to-date. Referrals have been placed accordingly. Immunizations are up-to-date or declined.    Subjective:   Chief Complaint  Patient presents with  . Annual Exam    Pt. is here for a annual physical and would like to get blood work.    HPI Ruth Conley 52 y.o. female presents to office today for annual physical exam. PAP has been deferred to GYN today as patient is on her menstrual  cycle. She has an upcoming appointment.     Review of Systems  Constitutional: Negative.  Negative for chills, fever, malaise/fatigue and weight loss.  HENT: Negative.  Negative for congestion, hearing loss,  nosebleeds, sinus pain and sore throat.   Eyes: Negative.  Negative for blurred vision, double vision, photophobia and pain.  Respiratory: Negative.  Negative for cough, sputum production, shortness of breath and wheezing.   Cardiovascular: Negative.  Negative for chest pain, palpitations and leg swelling.  Gastrointestinal: Negative.  Negative for abdominal pain, constipation, diarrhea, heartburn, nausea and vomiting.  Genitourinary:       AUB  Musculoskeletal: Negative.  Negative for joint pain and myalgias.  Skin: Negative.  Negative for rash.  Neurological: Negative.  Negative for dizziness, tremors, speech  change, focal weakness, seizures and headaches.  Endo/Heme/Allergies: Negative.  Negative for environmental allergies.  Psychiatric/Behavioral: Negative.  Negative for depression and suicidal ideas. The patient is not nervous/anxious and does not have insomnia.     Past Medical History:  Diagnosis Date  . Carotid stenosis    a. 08/2014: dopplers showing 40-59% RICA stenosis and 8-67% LICA stenosis  . Hypertension   . Stroke ([place])   . TIA (transient ischemic attack) 2016    Past Surgical History:  Procedure Laterality Date  . CESAREAN SECTION    . TRANSTHORACIC ECHOCARDIOGRAM  04/2015   (no change from 08/2014) Normal LV size and thickness. EF 60-65%. GR 1 DD. No wall motion abnormality. No source of embolism noted    Family History  Problem Relation Age of Onset  . Hypertension Mother   . CAD Father 40  . Stroke Paternal Uncle     Social History Reviewed with no changes to be made today.   Outpatient Medications Prior to Visit  Medication Sig Dispense Refill  . Cholecalciferol (VITAMIN D3 PO) Take by mouth.    . Coenzyme Q-10 100 MG capsule Take 100 mg by mouth daily. Reported on 11/10/2015    . Multiple Vitamins-Minerals (MULTIVITAMIN WITH MINERALS) tablet Take 1 tablet by mouth daily.    Marland Kitchen VITAMIN E PO Take by mouth.    Marland Kitchen atorvastatin (LIPITOR) 10 MG tablet TAKE 1 TABLET BY MOUTH DAILY AT 6 PM. 90 tablet 2  . clopidogrel (PLAVIX) 75 MG tablet Take 1 tablet (75 mg total) by mouth daily. 90 tablet 2  . ferrous sulfate 325 (65 FE) MG tablet TAKE 1 TABLET BY MOUTH EVERY OTHER DAY. 30 tablet 2  . hydrochlorothiazide (MICROZIDE) 12.5 MG capsule Take 1 capsule (12.5 mg total) by mouth daily. 90 capsule 3  . metoprolol succinate (TOPROL-XL) 50 MG 24 hr tablet Take 1 tablet (50 mg total) by mouth daily. Take with or immediately following a meal. 90 tablet 2  . Omega 3 1200 MG CAPS Take 600 mg by mouth daily.     No facility-administered medications prior to visit.     Allergies    Allergen Reactions  . Lisinopril Cough       Objective:    BP 126/83 (BP Location: Right Arm, Patient Position: Sitting, Cuff Size: Normal)   Pulse 97   Temp 98.3 F (36.8 C) (Oral)   Ht _0  (1.702 m)   Wt 195 lb 12.8 oz (88.8 kg)   LMP 01/27/2018   SpO2 100%   BMI 30.67 kg/m  Wt Readings from Last 3 Encounters:  01/31/18 195 lb 12.8 oz (88.8 kg)  05/17/17 211 lb 3.2 oz (95.8 kg)  12/19/16 202 lb 3.2 oz (91.7 kg)    Physical Exam  Constitutional: She is oriented to person, place, and time. She appears well-developed and well-nourished.  HENT:  Head: Normocephalic and atraumatic.  Right Ear: Hearing, tympanic membrane, external  ear and ear canal normal.  Left Ear: Hearing, external ear and ear canal normal. A middle ear effusion is present.  Nose: Nose normal. Right sinus exhibits no maxillary sinus tenderness and no frontal sinus tenderness. Left sinus exhibits no maxillary sinus tenderness and no frontal sinus tenderness.  Mouth/Throat: Oropharynx is clear and moist and mucous membranes are normal. Abnormal dentition. No oropharyngeal exudate. Tonsils are 2+ on the right. Tonsils are 2+ on the left.  Eyes: Pupils are equal, round, and reactive to light. Conjunctivae and EOM are normal. Right eye exhibits no discharge. No scleral icterus.  Neck: Normal range of motion. Neck supple. No tracheal deviation present. No thyromegaly present.  Cardiovascular: Normal rate, regular rhythm, normal heart sounds and intact distal pulses. Exam reveals no friction rub.  No murmur heard. Pulmonary/Chest: Effort normal and breath sounds normal. No accessory muscle usage or stridor. No respiratory distress. She has no decreased breath sounds. She has no wheezes. She has no rhonchi. She has no rales. She exhibits no tenderness. Right breast exhibits no inverted nipple, no mass, no nipple discharge, no skin change and no tenderness. Left breast exhibits no inverted nipple, no mass, no nipple  discharge, no skin change and no tenderness. No breast swelling, tenderness, discharge or bleeding. Breasts are symmetrical.  Abdominal: Soft. Bowel sounds are normal. She exhibits no distension and no mass. There is no tenderness. There is no rebound and no guarding. No hernia.  Musculoskeletal: Normal range of motion. She exhibits no edema, tenderness or deformity.  Lymphadenopathy:    She has no cervical adenopathy.  Neurological: She is alert and oriented to person, place, and time. She has normal strength. No cranial nerve deficit or sensory deficit. She displays a negative Romberg sign. Coordination and gait normal.  Reflex Scores:      Patellar reflexes are 1+ on the right side and 1+ on the left side. Skin: Skin is warm and dry. Capillary refill takes less than 2 seconds. No rash noted. No erythema. No pallor.  Psychiatric: She has a normal mood and affect. Her speech is normal and behavior is normal. Judgment and thought content normal. Cognition and memory are normal.       Patient has been counseled extensively about nutrition and exercise as well as the importance of adherence with medications and regular follow-up. The patient was given clear instructions to go to ER or return to medical center if symptoms don't improve, worsen or new problems develop. The patient verbalized understanding.   Follow-up: Return in about 6 months (around 08/02/2018).   Gildardo Pounds, FNP-BC [facility] and [place], [place]   01/31/2018, 7:20 PM

## 2018-02-01 LAB — CMP14+EGFR
ALBUMIN: 3.9 g/dL (ref 3.5–5.5)
ALT: 11 IU/L (ref 0–32)
AST: 19 IU/L (ref 0–40)
Albumin/Globulin Ratio: 1.4 (ref 1.2–2.2)
Alkaline Phosphatase: 62 IU/L (ref 39–117)
BILIRUBIN TOTAL: 0.3 mg/dL (ref 0.0–1.2)
BUN / CREAT RATIO: 10 (ref 9–23)
BUN: 8 mg/dL (ref 6–24)
CO2: 26 mmol/L (ref 20–29)
Calcium: 9.1 mg/dL (ref 8.7–10.2)
Chloride: 97 mmol/L (ref 96–106)
Creatinine, Ser: 0.83 mg/dL (ref 0.57–1.00)
GFR calc non Af Amer: 82 mL/min/{1.73_m2} (ref >59)
GFR, EST AFRICAN AMERICAN: 95 mL/min/{1.73_m2} (ref >59)
GLOBULIN, TOTAL: 2.7 g/dL (ref 1.5–4.5)
GLUCOSE: 89 mg/dL (ref 65–99)
Potassium: 3.8 mmol/L (ref 3.5–5.2)
Sodium: 140 mmol/L (ref 134–144)
Total Protein: 6.6 g/dL (ref 6.0–8.5)

## 2018-02-01 LAB — CBC
HEMATOCRIT: 30.9 % — AB (ref 34.0–46.6)
HEMOGLOBIN: 9.7 g/dL — AB (ref 11.1–15.9)
MCH: 25.6 pg — AB (ref 26.6–33.0)
MCHC: 31.4 g/dL — ABNORMAL LOW (ref 31.5–35.7)
MCV: 82 fL (ref 79–97)
Platelets: 349 10*3/uL (ref 150–450)
RBC: 3.79 x10E6/uL (ref 3.77–5.28)
RDW: 14.6 % (ref 12.3–15.4)
WBC: 4 10*3/uL (ref 3.4–10.8)

## 2018-02-01 LAB — LIPID PANEL
Chol/HDL Ratio: 2.3 ratio (ref 0.0–4.4)
Cholesterol, Total: 150 mg/dL (ref 100–199)
HDL: 65 mg/dL (ref >39)
LDL CALC: 72 mg/dL (ref 0–99)
Triglycerides: 65 mg/dL (ref 0–149)
VLDL CHOLESTEROL CAL: 13 mg/dL (ref 5–40)

## 2018-02-01 LAB — VITAMIN D 25 HYDROXY (VIT D DEFICIENCY, FRACTURES): Vit D, 25-Hydroxy: 37 ng/mL (ref 30.0–100.0)

## 2018-02-08 ENCOUNTER — Ambulatory Visit: Payer: BLUE CROSS/BLUE SHIELD | Admitting: Obstetrics & Gynecology

## 2018-02-15 ENCOUNTER — Telehealth: Payer: Self-pay

## 2018-02-15 NOTE — Telephone Encounter (Signed)
CMA spoke to patient to inform her she need to stop the office to pick up a FIT test.  Pt. Stated she will pick up on Monday.

## 2018-02-15 NOTE — Telephone Encounter (Signed)
-----  Message from Gildardo Pounds, NP sent at 01/31/2018  7:28 PM EDT ----- Please call patient and have her pick up fit kit. Thanks

## 2018-02-19 ENCOUNTER — Ambulatory Visit (INDEPENDENT_AMBULATORY_CARE_PROVIDER_SITE_OTHER): Payer: BLUE CROSS/BLUE SHIELD | Admitting: Obstetrics & Gynecology

## 2018-02-19 ENCOUNTER — Encounter: Payer: Self-pay | Admitting: Obstetrics & Gynecology

## 2018-02-19 ENCOUNTER — Other Ambulatory Visit (HOSPITAL_COMMUNITY)
Admission: RE | Admit: 2018-02-19 | Discharge: 2018-02-19 | Disposition: A | Discharge status: Home / Self Care | Payer: BLUE CROSS/BLUE SHIELD | Source: Ambulatory Visit | Attending: Obstetrics & Gynecology | Admitting: Obstetrics & Gynecology

## 2018-02-19 VITALS — BP 112/75 | HR 105 | Ht 67.0 in | Wt 196.4 lb

## 2018-02-19 DIAGNOSIS — N921 Excessive and frequent menstruation with irregular cycle: Secondary | ICD-10-CM

## 2018-02-19 DIAGNOSIS — Z30432 Encounter for removal of intrauterine contraceptive device: Secondary | ICD-10-CM

## 2018-02-19 DIAGNOSIS — D250 Submucous leiomyoma of uterus: Secondary | ICD-10-CM | POA: Diagnosis not present

## 2018-02-19 DIAGNOSIS — T8332XA Displacement of intrauterine contraceptive device, initial encounter: Secondary | ICD-10-CM | POA: Insufficient documentation

## 2018-02-19 DIAGNOSIS — D219 Benign neoplasm of connective and other soft tissue, unspecified: Secondary | ICD-10-CM

## 2018-02-19 DIAGNOSIS — Z01419 Encounter for gynecological examination (general) (routine) without abnormal findings: Secondary | ICD-10-CM | POA: Diagnosis not present

## 2018-02-19 DIAGNOSIS — D252 Subserosal leiomyoma of uterus: Secondary | ICD-10-CM | POA: Diagnosis not present

## 2018-02-19 NOTE — Progress Notes (Signed)
Patient ID: Ruth Conley, female   DOB: 12/16/1965, 52 y.o.   MRN: [id number]  Chief Complaint  Patient presents with  . Gynecologic Exam    HPI Ruth Conley is a 52 y.o. female.  [id number] Patient's last menstrual period was 01/28/2018 (approximate). Patient has menses every 2-3 weeks with heavy flow for 2 days, which has worsened in the last 6 months. Mirena was placed 08/2015 for menorrhagia with fibroids. [place] was done 04/2015.  HPI  Past Medical History:  Diagnosis Date  . Carotid stenosis    a. 08/2014: dopplers showing 40-59% RICA stenosis and 3-38% LICA stenosis  . Hypertension   . Stroke ([place])   . TIA (transient ischemic attack) 2016    Past Surgical History:  Procedure Laterality Date  . CESAREAN SECTION    . TRANSTHORACIC ECHOCARDIOGRAM  04/2015   (no change from 08/2014) Normal LV size and thickness. EF 60-65%. GR 1 DD. No wall motion abnormality. No source of embolism noted    Family History  Problem Relation Age of Onset  . Hypertension Mother   . CAD Father 17  . Stroke Paternal Uncle     Social History Social History   Tobacco Use  . Smoking status: Never Smoker  . Smokeless tobacco: Never Used  Substance Use Topics  . Alcohol use: No  . Drug use: No    Allergies  Allergen Reactions  . Lisinopril Cough    Current Outpatient Medications  Medication Sig Dispense Refill  . atorvastatin (LIPITOR) 10 MG tablet TAKE 1 TABLET BY MOUTH DAILY AT 6 PM. 90 tablet 2  . Cholecalciferol (VITAMIN D3 PO) Take by mouth.    . clopidogrel (PLAVIX) 75 MG tablet Take 1 tablet (75 mg total) by mouth daily. 90 tablet 2  . Coenzyme Q-10 100 MG capsule Take 100 mg by mouth daily. Reported on 11/10/2015    . ferrous sulfate 325 (65 FE) MG tablet Take 1 tablet (325 mg total) by mouth every other day. 90 tablet 2  . hydrochlorothiazide (MICROZIDE) 12.5 MG capsule Take 1 capsule (12.5 mg total) by mouth daily. 90 capsule 3  . lactobacillus acidophilus (BACID) TABS  tablet Take 2 tablets by mouth 3 (three) times daily.    . metoprolol succinate (TOPROL-XL) 50 MG 24 hr tablet Take 1 tablet (50 mg total) by mouth daily. Take with or immediately following a meal. 90 tablet 2  . Multiple Vitamins-Minerals (MULTIVITAMIN WITH MINERALS) tablet Take 1 tablet by mouth daily.    . Omega 3 1200 MG CAPS Take 600 mg by mouth daily.    . vitamin B-12 (CYANOCOBALAMIN) 100 MCG tablet Take 100 mcg by mouth daily.     No current facility-administered medications for this visit.     Review of Systems Review of Systems  Constitutional: Positive for unexpected weight change.  Respiratory: Negative.   Gastrointestinal: Negative.   Endocrine: Positive for cold intolerance.  Genitourinary: Positive for menstrual problem and vaginal discharge. Negative for pelvic pain.  Musculoskeletal: Negative.     Blood pressure 112/75, pulse (!) 105, height 5\' 7"  (1.702 m), weight 196 lb 6.4 oz (89.1 kg), last menstrual period 01/28/2018.  Physical Exam Physical Exam  Constitutional: She appears well-developed. No distress.  Cardiovascular: Normal rate.  Pulmonary/Chest: Effort normal.  Abdominal: Soft.  Genitourinary: Vagina normal. No vaginal discharge found.  Genitourinary Comments: Mirena partially expelled with one arm imbedded into cervix. Device was removed intact, pap done. Uterus 8-10 week size no adnexal mass  Vitals reviewed.  Data Reviewed  CLINICAL DATA:  Menorrhagia, fibroids, anemia  EXAM: TRANSABDOMINAL AND TRANSVAGINAL ULTRASOUND OF PELVIS  TECHNIQUE: Both transabdominal and transvaginal ultrasound examinations of the pelvis were performed. Transabdominal technique was performed for global imaging of the pelvis including uterus, ovaries, adnexal regions, and pelvic cul-de-sac. It was necessary to proceed with endovaginal exam following the transabdominal exam to visualize the endometrium.  COMPARISON:  None  FINDINGS: Uterus  Measurements:  11.6 x 7.8 x 8.8 cm. Multiple uterine fibroids, including:  --2.2 x 2.2 x 2.1 cm submucosal/intracavitary fibroid  --1.5 x 1.3 x 1.7 cm intramural fibroid in the posterior uterine body  --1.7 x 1.6 x 1.9 cm intramural fibroid in the posterior uterine body  --3.2 x 2.3 x 3.3 cm subserosal fibroid in the right uterine body  Endometrium  Thickness: 16 mm.  No focal abnormality visualized.  Right ovary  Measurements: 3.3 x 2.6 x 2.5 cm. 10 x 10 x 16 mm corpus luteal cyst.  Left ovary  Measurements: 3.1 x 1.2 x 1.5 cm. Normal appearance/no adnexal mass.  Other findings  No abnormal free fluid.  IMPRESSION: Multiple uterine fibroids, including a 3.3 cm subserosal fibroid in the right uterine body and a 2.2 cm submucosal/intracavitary fibroid.   Electronically Signed   By: Julian Hy M.D.   On: 05/01/2015 09:06 Assessment    Menometrorrhagia Fibroid uterus Patient Active Problem List   Diagnosis Date Noted  . Malpositioned intrauterine device (IUD) 02/19/2018  . Cerebrovascular accident (CVA) due to occlusion of right posterior cerebral artery ([place]) 07/21/2015  . Fibroids 05/20/2015  . Menorrhagia with regular cycle 04/24/2015  . Fibroid, uterine 04/24/2015  . Iron deficiency anemia due to chronic blood loss 04/24/2015  . Palpitations 04/15/2015  . Obesity 04/15/2015  . History of TIA (transient ischemic attack) 04/15/2015  . Acute ischemic stroke ([place]) 04/06/2015  . Left sided numbness   . Hyperlipidemia   . TIA (transient ischemic attack) 08/30/2014  . Essential hypertension 08/30/2014       Plan    Repeat pelvic US and RTC 2-3 months Iron replacement for anemia       Emeterio Reeve 02/19/2018, 9:26 AM

## 2018-02-19 NOTE — Progress Notes (Signed)
Mammo sched for 04/04/18 @ 9am to arrive at 8:40a////

## 2018-02-19 NOTE — Patient Instructions (Signed)
Uterine Fibroids Uterine fibroids are tissue masses (tumors). They are also called leiomyomas. They can develop inside of a woman's womb (uterus). They can grow very large. Fibroids are not cancerous (benign). Most fibroids do not require medical treatment. Follow these instructions at home:  Keep all follow-up visits as told by your doctor. This is important.  Take medicines only as told by your doctor. ? If you were prescribed a hormone treatment, take the hormone medicines exactly as told. ? Do not take aspirin. It can cause bleeding.  Ask your doctor about taking iron pills and increasing the amount of dark green, leafy vegetables in your diet. These actions can help to boost your blood iron levels.  Pay close attention to your period. Tell your doctor about any changes, such as: ? Increased blood flow. This may require you to use more pads or tampons than usual per month. ? A change in the number of days that your period lasts per month. ? A change in symptoms that come with your period, such as back pain or cramping in your belly area (abdomen). Contact a doctor if:  You have pain in your back or the area between your hip bones (pelvic area) that is not controlled by medicines.  You have pain in your abdomen that is not controlled with medicines.  You have an increase in bleeding between and during periods.  You soak tampons or pads in a half hour or less.  You feel lightheaded.  You feel extra tired.  You feel weak. Get help right away if:  You pass out (faint).  You have a sudden increase in pelvic pain. This information is not intended to replace advice given to you by your health care provider. Make sure you discuss any questions you have with your health care provider. Document Released: 05/21/2010 Document Revised: 12/18/2015 Document Reviewed: 10/15/2013 Elsevier Interactive Patient Education  2018 Elsevier Inc.  

## 2018-02-19 NOTE — Progress Notes (Signed)
Patient ID: Ruth Conley, female   DOB: 05-06-1965, 52 y.o.   MRN: [id number]

## 2018-02-23 ENCOUNTER — Ambulatory Visit (HOSPITAL_COMMUNITY): Payer: BLUE CROSS/BLUE SHIELD

## 2018-02-23 LAB — CYTOLOGY - PAP
Diagnosis: UNDETERMINED — AB
HPV (WINDOPATH): DETECTED — AB
HPV 16/18/45 genotyping: NEGATIVE

## 2018-03-01 ENCOUNTER — Telehealth: Payer: Self-pay | Admitting: *Deleted

## 2018-03-01 NOTE — Telephone Encounter (Signed)
Patient called with questions re: her pap smear results. RN noted Dr.Arnold has sent results message to notify patient. I informed patient her pap smear came back abnormal ASCUS with hr hpv+. I explained colposcopy recommended and that registrars will call her with appt. I answered her questions and she voices understanding.

## 2018-03-01 NOTE — Telephone Encounter (Signed)
-----   Message from Woodroe Mode, MD sent at 03/01/2018  2:50 PM EDT ----- ASCUS pap pos HPV needs colpo, message sent via [facility]

## 2018-03-14 ENCOUNTER — Ambulatory Visit (HOSPITAL_COMMUNITY): Admission: RE | Admit: 2018-03-14 | Payer: BLUE CROSS/BLUE SHIELD | Source: Ambulatory Visit

## 2018-03-27 ENCOUNTER — Ambulatory Visit: Payer: BLUE CROSS/BLUE SHIELD

## 2018-04-04 ENCOUNTER — Ambulatory Visit: Payer: BLUE CROSS/BLUE SHIELD

## 2018-04-12 ENCOUNTER — Ambulatory Visit: Payer: BLUE CROSS/BLUE SHIELD | Admitting: Obstetrics & Gynecology

## 2018-04-12 ENCOUNTER — Ambulatory Visit (HOSPITAL_COMMUNITY): Payer: BLUE CROSS/BLUE SHIELD

## 2018-05-07 ENCOUNTER — Ambulatory Visit (HOSPITAL_COMMUNITY)
Admission: RE | Admit: 2018-05-07 | Discharge: 2018-05-07 | Disposition: A | Discharge status: Home / Self Care | Payer: BLUE CROSS/BLUE SHIELD | Source: Ambulatory Visit | Attending: Obstetrics & Gynecology | Admitting: Obstetrics & Gynecology

## 2018-05-07 ENCOUNTER — Other Ambulatory Visit (HOSPITAL_COMMUNITY)
Admission: RE | Admit: 2018-05-07 | Discharge: 2018-05-07 | Disposition: A | Discharge status: Home / Self Care | Payer: BLUE CROSS/BLUE SHIELD | Source: Ambulatory Visit | Attending: Obstetrics & Gynecology | Admitting: Obstetrics & Gynecology

## 2018-05-07 ENCOUNTER — Ambulatory Visit (INDEPENDENT_AMBULATORY_CARE_PROVIDER_SITE_OTHER): Payer: BLUE CROSS/BLUE SHIELD | Admitting: Obstetrics & Gynecology

## 2018-05-07 ENCOUNTER — Encounter: Payer: Self-pay | Admitting: Obstetrics & Gynecology

## 2018-05-07 VITALS — BP 129/91 | HR 92 | Wt 199.8 lb

## 2018-05-07 DIAGNOSIS — D259 Leiomyoma of uterus, unspecified: Secondary | ICD-10-CM | POA: Diagnosis not present

## 2018-05-07 DIAGNOSIS — R87610 Atypical squamous cells of undetermined significance on cytologic smear of cervix (ASC-US): Secondary | ICD-10-CM

## 2018-05-07 DIAGNOSIS — Z01419 Encounter for gynecological examination (general) (routine) without abnormal findings: Secondary | ICD-10-CM | POA: Diagnosis not present

## 2018-05-07 LAB — POCT PREGNANCY, URINE: Preg Test, Ur: NEGATIVE

## 2018-05-07 MED ORDER — MEGESTROL ACETATE 40 MG PO TABS: 40.0000 mg | ORAL_TABLET | Freq: Every day | ORAL | 3 refills | Status: DC

## 2018-05-07 MED FILL — HYDROCHLOROTHIAZIDE 12.5 MG: 12.5 | 90 days supply | Qty: 90 | Fill #1

## 2018-05-07 MED FILL — FERROUS SULFATE 325 MG TAB: 325 (65 FE) | 90 days supply | Qty: 90 | Fill #1

## 2018-05-07 MED FILL — CLOPIDOGREL 75 MG TABLET: 75 | 90 days supply | Qty: 90 | Fill #1

## 2018-05-07 MED FILL — METOPROLOL SUCCINATE ER 50: 50 | 90 days supply | Qty: 90 | Fill #1

## 2018-05-07 MED FILL — MEGESTROL 40 MG TABLET: 40 | 90 days supply | Qty: 90 | Fill #0

## 2018-05-07 MED FILL — ATORVASTATIN 10 MG TABLET: 10 | 90 days supply | Qty: 90 | Fill #1

## 2018-05-07 NOTE — Patient Instructions (Signed)
Colposcopy, Care After  This sheet gives you information about how to care for yourself after your procedure. Your doctor may also give you more specific instructions. If you have problems or questions, contact your doctor.  What can I expect after the procedure?  If you did not have a tissue sample removed (did not have a biopsy), you may only have some spotting for a few days. You can go back to your normal activities.  If you had a tissue sample removed, it is common to have:   Soreness and pain. This may last for a few days.   Light-headedness.   Mild bleeding from your vagina or dark-colored, grainy discharge from your vagina. This may last for a few days. You may need to wear a sanitary pad.   Spotting for at least 48 hours after the procedure.  Follow these instructions at home:     Take over-the-counter and prescription medicines only as told by your doctor. Ask your doctor what medicines you can start taking again. This is very important if you take blood-thinning medicine.   Do not drive or use heavy machinery while taking prescription pain medicine.   For 3 days, or as long as your doctor tells you, avoid:  ? Douching.  ? Using tampons.  ? Having sex.   If you use birth control (contraception), keep using it.   Limit activity for the first day after the procedure. Ask your doctor what activities are safe for you.   It is up to you to get the results of your procedure. Ask your doctor when your results will be ready.   Keep all follow-up visits as told by your doctor. This is important.  Contact a doctor if:   You get a skin rash.  Get help right away if:   You are bleeding a lot from your vagina. It is a lot of bleeding if you are using more than one pad an hour for 2 hours in a row.   You have clumps of blood (blood clots) coming from your vagina.   You have a fever.   You have chills   You have pain in your lower belly (pelvic area).   You have signs of infection, such as vaginal  discharge that is:  ? Different than usual.  ? Yellow.  ? Bad-smelling.   You have very pain or cramps in your lower belly that do not get better with medicine.   You feel light-headed.   You feel dizzy.   You pass out (faint).  Summary   If you did not have a tissue sample removed (did not have a biopsy), you may only have some spotting for a few days. You can go back to your normal activities.   If you had a tissue sample removed, it is common to have mild pain and spotting for 48 hours.   For 3 days, or as long as your doctor tells you, avoid douching, using tampons and having sex.   Get help right away if you have bleeding, very bad pain, or signs of infection.  This information is not intended to replace advice given to you by your health care provider. Make sure you discuss any questions you have with your health care provider.  Document Released: 10/05/2007 Document Revised: 01/06/2016 Document Reviewed: 01/06/2016  Elsevier Interactive Patient Education  2019 Elsevier Inc.

## 2018-05-07 NOTE — Progress Notes (Signed)
Patient ID: Ruth Conley, female   DOB: 1965/06/24, 53 y.o.   MRN: [id number]  Chief Complaint  Patient presents with  . Colposcopy    HPI Ruth Conley is a 53 y.o. female.  [id number] Patient's last menstrual period was 04/22/2018 (approximate). [place] was done today and fibroids are stable since 2016 HPI  Indications: Pap smear on October 2019 showed: ASCUS with POSITIVE high risk HPV. Previous colposcopy: no. Prior cervical treatment: no treatment.  Past Medical History:  Diagnosis Date  . Carotid stenosis    a. 08/2014: dopplers showing 40-59% RICA stenosis and 8-09% LICA stenosis  . Hypertension   . Stroke ([place])   . TIA (transient ischemic attack) 2016    Past Surgical History:  Procedure Laterality Date  . CESAREAN SECTION    . TRANSTHORACIC ECHOCARDIOGRAM  04/2015   (no change from 08/2014) Normal LV size and thickness. EF 60-65%. GR 1 DD. No wall motion abnormality. No source of embolism noted    Family History  Problem Relation Age of Onset  . Hypertension Mother   . CAD Father 47  . Stroke Paternal Uncle     Social History Social History   Tobacco Use  . Smoking status: Never Smoker  . Smokeless tobacco: Never Used  Substance Use Topics  . Alcohol use: No  . Drug use: No    Allergies  Allergen Reactions  . Lisinopril Cough    Current Outpatient Medications  Medication Sig Dispense Refill  . atorvastatin (LIPITOR) 10 MG tablet TAKE 1 TABLET BY MOUTH DAILY AT 6 PM. 90 tablet 2  . Cholecalciferol (VITAMIN D3 PO) Take by mouth.    . clopidogrel (PLAVIX) 75 MG tablet Take 1 tablet (75 mg total) by mouth daily. 90 tablet 2  . Coenzyme Q-10 100 MG capsule Take 100 mg by mouth daily. Reported on 11/10/2015    . ferrous sulfate 325 (65 FE) MG tablet Take 1 tablet (325 mg total) by mouth every other day. 90 tablet 2  . hydrochlorothiazide (MICROZIDE) 12.5 MG capsule Take 1 capsule (12.5 mg total) by mouth daily. 90 capsule 3  . lactobacillus acidophilus  (BACID) TABS tablet Take 2 tablets by mouth 3 (three) times daily.    . metoprolol succinate (TOPROL-XL) 50 MG 24 hr tablet Take 1 tablet (50 mg total) by mouth daily. Take with or immediately following a meal. 90 tablet 2  . Multiple Vitamins-Minerals (MULTIVITAMIN WITH MINERALS) tablet Take 1 tablet by mouth daily.    . Omega 3 1200 MG CAPS Take 600 mg by mouth daily.    . vitamin B-12 (CYANOCOBALAMIN) 100 MCG tablet Take 100 mcg by mouth daily.    . megestrol (MEGACE) 40 MG tablet Take 1 tablet (40 mg total) by mouth daily. 30 tablet 3   No current facility-administered medications for this visit.     Review of Systems Review of Systems  Genitourinary: Positive for menstrual problem and pelvic pain.    Blood pressure (!) 129/91, pulse 92, weight 199 lb 12.8 oz (90.6 kg), last menstrual period 04/22/2018.  Physical Exam Physical Exam Constitutional:      Appearance: Normal appearance.  Pulmonary:     Effort: Pulmonary effort is normal.  Neurological:     Mental Status: She is alert.     Data Reviewed [place] result today reviewed  Assessment    Procedure Details  The risks and benefits of the procedure and Written informed consent obtained.  Speculum placed in vagina and excellent visualization of cervix achieved,  cervix swabbed x 3 with acetic acid solution. Small nabothian cyst, no AWE, TZ and SCJ seen Specimens: ECC and Bx 6 o'clock  Complications: none.     Plan    Specimens labelled and sent to Pathology. Call to discuss Pathology results in 2 weeks.   Megace 40 mg daily   Ruth Conley 05/07/2018, 11:14 AM

## 2018-05-07 NOTE — Addendum Note (Signed)
Addended by: Fidela Juneau A on: 05/07/2018 12:02 PM   Modules accepted: Orders

## 2018-05-08 ENCOUNTER — Encounter: Payer: Self-pay | Admitting: *Deleted

## 2018-05-08 ENCOUNTER — Telehealth: Payer: Self-pay | Admitting: *Deleted

## 2018-05-08 LAB — FOLLICLE STIMULATING HORMONE: FSH: 10.8 m[IU]/mL

## 2018-05-08 NOTE — Telephone Encounter (Signed)
-----   Message from Woodroe Mode, MD sent at 05/08/2018 12:12 PM EST ----- No dysplasia, pap and cotesting in 12 months

## 2018-05-08 NOTE — Telephone Encounter (Signed)
Called pt to inform her of colposcopy results showed no dysplasia.  Pt advised that MD recommended pap with cotesting in 1 year.  Pt verbalized understanding.

## 2018-05-16 ENCOUNTER — Ambulatory Visit
Admission: RE | Admit: 2018-05-16 | Discharge: 2018-05-16 | Disposition: A | Discharge status: Home / Self Care | Payer: BLUE CROSS/BLUE SHIELD | Source: Ambulatory Visit | Attending: Obstetrics & Gynecology | Admitting: Obstetrics & Gynecology

## 2018-05-16 DIAGNOSIS — Z01419 Encounter for gynecological examination (general) (routine) without abnormal findings: Secondary | ICD-10-CM

## 2018-05-16 DIAGNOSIS — Z1231 Encounter for screening mammogram for malignant neoplasm of breast: Secondary | ICD-10-CM | POA: Diagnosis not present

## 2018-08-02 MED FILL — MEGESTROL 40 MG TABLET: 40 | 30 days supply | Qty: 30 | Fill #1

## 2018-08-16 ENCOUNTER — Telehealth: Payer: Self-pay | Admitting: *Deleted

## 2018-08-16 NOTE — Telephone Encounter (Signed)
Pt called requesting to speak with a nurse regarding extensive menstrual cycle for 2 months.

## 2018-08-17 ENCOUNTER — Telehealth: Payer: Self-pay | Admitting: Family Medicine

## 2018-08-17 NOTE — Telephone Encounter (Signed)
Patient called in stating that she has been trying to get in touch with the office for days and have not been able to contact us. Patient stated that she has been bleeding for the last couple of moments and is needing an appointment to see the doctor. Let patient know that due to the corona virus I would be forwarding this message to the nurses to see if she needs an appointment in office or can it be virtual. Patient verbalized understanding.

## 2018-08-20 ENCOUNTER — Telehealth: Payer: Self-pay | Admitting: Family Medicine

## 2018-08-20 NOTE — Telephone Encounter (Signed)
Spoke with patient to give her a follow-up appointment with Roselie Awkward to address her bleeding concerns. Patient verbalized understanding that her about will be a virtual visit and she stated that she already has the app downloaded. Patient had no further questions.

## 2018-08-20 NOTE — Telephone Encounter (Signed)
Returned pt's call regarding her bleeding.  Pt stated she saw Dr. Roselie Awkward in January.  Pt reports she has fibroids and has only had mild symptoms and was placed on megace TID but pt only wanted to take it once daily and now pt has been having a lot of clots daily last week.  Stopped Sunday morning when she started to take the megace TID.  Pt has also noticed weight gain, particularly in the midsection.  Pt wants to schedule an appointment to see a provider, preferably Dr Roselie Awkward.  Will send message to front office staff.  Pt verbalized understanding.

## 2018-09-03 ENCOUNTER — Telehealth (INDEPENDENT_AMBULATORY_CARE_PROVIDER_SITE_OTHER): Payer: BLUE CROSS/BLUE SHIELD | Admitting: *Deleted

## 2018-09-03 DIAGNOSIS — N939 Abnormal uterine and vaginal bleeding, unspecified: Secondary | ICD-10-CM

## 2018-09-03 NOTE — Telephone Encounter (Signed)
Pt called stating she feels that she needs to stop the megace because of the way they make her feel because when she first started taking them she didn't have a menstrual for 6 weeks and then started gaining weight.  Pt states that she decreased the does and had increased bleeding and it was for a long time then she started taking them again and the bleeding stopped.  Pt states that she believes they are for pts who want to gain weight and she doesn't need to do that.  Advised pt that when she stops the pills, she may notice an increase in vaginal bleeding since the medication is used to slow/stop that bleeding.  Pt verbalized understanding. Pt will speak with provider at her next appointment about different management options.

## 2018-09-26 ENCOUNTER — Telehealth (INDEPENDENT_AMBULATORY_CARE_PROVIDER_SITE_OTHER): Payer: BLUE CROSS/BLUE SHIELD | Admitting: Obstetrics & Gynecology

## 2018-09-26 ENCOUNTER — Encounter: Payer: Self-pay | Admitting: Obstetrics & Gynecology

## 2018-09-26 ENCOUNTER — Other Ambulatory Visit: Payer: Self-pay

## 2018-09-26 DIAGNOSIS — D219 Benign neoplasm of connective and other soft tissue, unspecified: Secondary | ICD-10-CM | POA: Diagnosis not present

## 2018-09-26 DIAGNOSIS — N920 Excessive and frequent menstruation with regular cycle: Secondary | ICD-10-CM | POA: Diagnosis not present

## 2018-09-26 NOTE — Patient Instructions (Signed)

## 2018-09-26 NOTE — Progress Notes (Signed)
Patient ID: Ruth Conley, female   DOB: 06-06-1965, 53 y.o.   MRN: [id number]   TELEHEALTH VIRTUAL GYNECOLOGY VISIT ENCOUNTER NOTE  I connected with Ruth Conley on 09/26/18 at  9:15 AM EDT by telephone at home and verified that I am speaking with the correct person using two identifiers.  I discussed the limitations, risks, security and privacy concerns of performing an evaluation and management service by telephone and the availability of in person appointments. I also discussed with the patient that there may be a patient responsible charge related to this service. The patient expressed understanding and agreed to proceed.   History:  Ruth Conley is a 53 y.o. [id number] female being evaluated today for menometrorrhagia with fibroids . She denies any abnormal vaginal discharge, bleeding, pelvic pain or other concerns.    She stopped Megace due to 20 lb weight gain, with increased appetite, cravings. No bleeding now, wants to follow with no medication now   Past Medical History:  Diagnosis Date  . Carotid stenosis    a. 08/2014: dopplers showing 40-59% RICA stenosis and 0-93% LICA stenosis  . Hypertension   . Stroke ([place])   . TIA (transient ischemic attack) 2016   Past Surgical History:  Procedure Laterality Date  . CESAREAN SECTION    . TRANSTHORACIC ECHOCARDIOGRAM  04/2015   (no change from 08/2014) Normal LV size and thickness. EF 60-65%. GR 1 DD. No wall motion abnormality. No source of embolism noted   The following portions of the patient's history were reviewed and updated as appropriate: allergies, current medications, past family history, past medical history, past social history, past surgical history and problem list.   Health Maintenance:  Normal pap and negative HRHPV on 01/2018.  Normal mammogram on 05/2018.   Review of Systems:  Pertinent items noted in HPI and remainder of comprehensive ROS otherwise negative.  Physical Exam:   General:  Alert, oriented  and cooperative.   Mental Status: Normal mood and affect perceived. Normal judgment and thought content.  Physical exam deferred due to nature of the encounter  Labs and Imaging No results found for this or any previous visit (from the past 336 hour(s)). No results found.    Assessment and Plan:     1. Fibroids On [place]  2. Menorrhagia with regular cycle Will observe for return of symptoms, for menopausal symptoms       I discussed the assessment and treatment plan with the patient. The patient was provided an opportunity to ask questions and all were answered. The patient agreed with the plan and demonstrated an understanding of the instructions.   The patient was advised to call back or seek an in-person evaluation/go to the ED if the symptoms worsen or if the condition fails to improve as anticipated.  I provided 15 minutes of non-face-to-face time during this encounter.   Emeterio Reeve, MD [facility], [place]

## 2018-09-26 NOTE — Progress Notes (Signed)
I connected with  Ruth Conley on 09/26/18 at  9:15 AM EDT by telephone and verified that I am speaking with the correct person using two identifiers.   I discussed the limitations, risks, security and privacy concerns of performing an evaluation and management service by telephone and virtually by MyChart and the availability of in person appointments. I also discussed with the patient that there may be a patient responsible charge related to this service. The patient expressed understanding and agreed to proceed. States period started 1st of February and just stopped last week. Stopped taking megace about 2 weeks ago. States thought it wasn't helping.   Danaysia Rader,RN 09/26/2018  9:04 AM

## 2018-10-23 ENCOUNTER — Other Ambulatory Visit: Payer: Self-pay | Admitting: Nurse Practitioner

## 2018-10-23 DIAGNOSIS — I63531 Cerebral infarction due to unspecified occlusion or stenosis of right posterior cerebral artery: Secondary | ICD-10-CM

## 2018-10-23 DIAGNOSIS — E782 Mixed hyperlipidemia: Secondary | ICD-10-CM

## 2018-10-23 DIAGNOSIS — R000 Tachycardia, unspecified: Secondary | ICD-10-CM

## 2018-10-23 DIAGNOSIS — Z8673 Personal history of transient ischemic attack (TIA), and cerebral infarction without residual deficits: Secondary | ICD-10-CM

## 2018-10-23 DIAGNOSIS — I10 Essential (primary) hypertension: Secondary | ICD-10-CM

## 2018-10-24 ENCOUNTER — Other Ambulatory Visit: Payer: Self-pay | Admitting: Nurse Practitioner

## 2018-10-24 DIAGNOSIS — R000 Tachycardia, unspecified: Secondary | ICD-10-CM

## 2018-10-24 DIAGNOSIS — E782 Mixed hyperlipidemia: Secondary | ICD-10-CM

## 2018-10-24 DIAGNOSIS — I63531 Cerebral infarction due to unspecified occlusion or stenosis of right posterior cerebral artery: Secondary | ICD-10-CM

## 2018-10-24 DIAGNOSIS — Z8673 Personal history of transient ischemic attack (TIA), and cerebral infarction without residual deficits: Secondary | ICD-10-CM

## 2018-10-24 DIAGNOSIS — I10 Essential (primary) hypertension: Secondary | ICD-10-CM

## 2018-11-29 ENCOUNTER — Telehealth: Payer: Self-pay | Admitting: *Deleted

## 2018-11-29 NOTE — Telephone Encounter (Signed)
Patient called stating she is out of refills for her Metoprolol. Patient medication was last filled on 10-23-18 and per pt she is out of medication. Patient is scheduled to return to office 12-28-18.

## 2018-11-30 ENCOUNTER — Other Ambulatory Visit: Payer: Self-pay | Admitting: Nurse Practitioner

## 2018-11-30 DIAGNOSIS — I10 Essential (primary) hypertension: Secondary | ICD-10-CM

## 2018-11-30 DIAGNOSIS — R000 Tachycardia, unspecified: Secondary | ICD-10-CM

## 2018-11-30 MED ORDER — METOPROLOL SUCCINATE ER 50 MG PO TB24: ORAL_TABLET | ORAL | 0 refills | Status: DC

## 2018-11-30 NOTE — Telephone Encounter (Signed)
Left message on voicemail informing that the Rx was sent to [facility] [address]

## 2018-11-30 NOTE — Telephone Encounter (Signed)
Medication sent to [facility] on [address]. As this pharmacy is not open on the weekends

## 2018-12-28 ENCOUNTER — Encounter: Payer: Self-pay | Admitting: Nurse Practitioner

## 2018-12-28 ENCOUNTER — Telehealth (HOSPITAL_BASED_OUTPATIENT_CLINIC_OR_DEPARTMENT_OTHER): Payer: BC Managed Care – PPO | Admitting: Nurse Practitioner

## 2018-12-28 DIAGNOSIS — E782 Mixed hyperlipidemia: Secondary | ICD-10-CM

## 2018-12-28 DIAGNOSIS — R000 Tachycardia, unspecified: Secondary | ICD-10-CM | POA: Diagnosis not present

## 2018-12-28 DIAGNOSIS — I10 Essential (primary) hypertension: Secondary | ICD-10-CM | POA: Diagnosis not present

## 2018-12-28 DIAGNOSIS — I63531 Cerebral infarction due to unspecified occlusion or stenosis of right posterior cerebral artery: Secondary | ICD-10-CM | POA: Diagnosis not present

## 2018-12-28 MED ORDER — METOPROLOL SUCCINATE ER 50 MG PO TB24: ORAL_TABLET | ORAL | 0 refills | Status: DC

## 2018-12-28 NOTE — Progress Notes (Signed)
Assessment & Plan:  Diagnoses and all orders for this visit:  Cerebrovascular accident (CVA) due to occlusion of right posterior cerebral artery ([place]) -     Ambulatory referral to Neurology -     CBC  Essential hypertension -     metoprolol succinate (TOPROL-XL) 50 MG 24 hr tablet; TAKE 1 TABLET BY MOUTH ONCE DAILY WITH  OR  FOLLOWING  A  MEAL -     CMP14+EGFR Continue all antihypertensives as prescribed.  Remember to bring in your blood pressure log with you for your follow up appointment.  DASH/Mediterranean Diets are healthier choices for HTN.    Tachycardia -     metoprolol succinate (TOPROL-XL) 50 MG 24 hr tablet; TAKE 1 TABLET BY MOUTH ONCE DAILY WITH  OR  FOLLOWING  A  MEAL  Mixed hyperlipidemia -     Lipid panel INSTRUCTIONS: Work on a low fat, heart healthy diet and participate in regular aerobic exercise program by working out at least 150 minutes per week; 5 days a week-30 minutes per day. Avoid red meat, fried foods. junk foods, sodas, sugary drinks, unhealthy snacking, alcohol and smoking.  Drink at least 48oz of water per day and monitor your carbohydrate intake daily.    Patient has been counseled on age-appropriate routine health concerns for screening and prevention. These are reviewed and up-to-date. Referrals have been placed accordingly. Immunizations are up-to-date or declined.    Subjective:  No chief complaint on file.  HPI Ruth Conley 53 y.o. female presents to office today for HTN.  Essential Hypertension She was supposed to have an office visit today for labs and BP recheck however she states there are issues today with her job flexibility and she had to change to a WebEx visit.  She states that she has been monitoring her blood pressure however cannot recall any recent blood pressure readings for me today. Denies chest pain, shortness of breath, palpitations, lightheadedness, dizziness, headaches or BLE edema.  BP Readings from Last 3 Encounters:   05/07/18 (!) 129/91  02/19/18 112/75  01/31/18 126/83    Review of Systems  Constitutional: Negative for fever, malaise/fatigue and weight loss.  HENT: Negative.  Negative for nosebleeds.   Eyes: Negative.  Negative for blurred vision, double vision and photophobia.  Respiratory: Negative.  Negative for cough and shortness of breath.   Cardiovascular: Positive for palpitations (well controlled). Negative for chest pain and leg swelling.  Gastrointestinal: Negative.  Negative for heartburn, nausea and vomiting.  Musculoskeletal: Negative.  Negative for myalgias.  Neurological: Negative.  Negative for dizziness, focal weakness, seizures and headaches.  Psychiatric/Behavioral: Negative.  Negative for suicidal ideas.    Past Medical History:  Diagnosis Date  . Carotid stenosis    a. 08/2014: dopplers showing 40-59% RICA stenosis and 1-19% LICA stenosis  . Hypertension   . Stroke ([place])   . TIA (transient ischemic attack) 2016    Past Surgical History:  Procedure Laterality Date  . CESAREAN SECTION    . TRANSTHORACIC ECHOCARDIOGRAM  04/2015   (no change from 08/2014) Normal LV size and thickness. EF 60-65%. GR 1 DD. No wall motion abnormality. No source of embolism noted    Family History  Problem Relation Age of Onset  . Hypertension Mother   . CAD Father 68  . Stroke Paternal Uncle     Social History Reviewed with no changes to be made today.   Outpatient Medications Prior to Visit  Medication Sig Dispense Refill  . atorvastatin (LIPITOR)  10 MG tablet TAKE 1 TABLET BY MOUTH ONCE DAILY AT  6  00  PM 30 tablet 0  . Cholecalciferol (VITAMIN D3 PO) Take by mouth.    . clopidogrel (PLAVIX) 75 MG tablet Take 1 tablet by mouth once daily 90 tablet 0  . Coenzyme Q-10 100 MG capsule Take 100 mg by mouth daily. Reported on 11/10/2015    . ferrous sulfate 325 (65 FE) MG tablet Take 1 tablet (325 mg total) by mouth every other day. 90 tablet 2  . hydrochlorothiazide (MICROZIDE) 12.5  MG capsule Take 1 capsule (12.5 mg total) by mouth daily. 90 capsule 3  . lactobacillus acidophilus (BACID) TABS tablet Take 2 tablets by mouth 3 (three) times daily.    . Multiple Vitamins-Minerals (MULTIVITAMIN WITH MINERALS) tablet Take 1 tablet by mouth daily.    . Omega 3 1200 MG CAPS Take 600 mg by mouth daily.    . vitamin B-12 (CYANOCOBALAMIN) 100 MCG tablet Take 100 mcg by mouth daily.    . megestrol (MEGACE) 40 MG tablet Take 1 tablet (40 mg total) by mouth daily. (Patient not taking: Reported on 09/26/2018) 30 tablet 3  . metoprolol succinate (TOPROL-XL) 50 MG 24 hr tablet TAKE 1 TABLET BY MOUTH ONCE DAILY WITH  OR  FOLLOWING  A  MEAL 30 tablet 0   No facility-administered medications prior to visit.     Allergies  Allergen Reactions  . Lisinopril Cough       Objective:    There were no vitals taken for this visit. Wt Readings from Last 3 Encounters:  05/07/18 199 lb 12.8 oz (90.6 kg)  02/19/18 196 lb 6.4 oz (89.1 kg)  01/31/18 195 lb 12.8 oz (88.8 kg)    Physical Exam Constitutional:      Appearance: Normal appearance.  Eyes:     Extraocular Movements: Extraocular movements intact.  Neck:     Musculoskeletal: Normal range of motion.  Pulmonary:     Effort: Pulmonary effort is normal.  Musculoskeletal: Normal range of motion.  Neurological:     General: No focal deficit present.     Mental Status: She is alert and oriented to person, place, and time. Mental status is at baseline.        Patient has been counseled extensively about nutrition and exercise as well as the importance of adherence with medications and regular follow-up. The patient was given clear instructions to go to ER or return to medical center if symptoms don't improve, worsen or new problems develop. The patient verbalized understanding.   Follow-up: Return for Fasting labs.   Gildardo Pounds, FNP-BC [facility] and [place], [place]   12/28/2018,  2:11 PM

## 2018-12-31 ENCOUNTER — Other Ambulatory Visit: Payer: Self-pay

## 2018-12-31 ENCOUNTER — Telehealth: Payer: Self-pay | Admitting: Nurse Practitioner

## 2018-12-31 ENCOUNTER — Ambulatory Visit: Payer: BC Managed Care – PPO | Attending: Nurse Practitioner

## 2018-12-31 DIAGNOSIS — I63531 Cerebral infarction due to unspecified occlusion or stenosis of right posterior cerebral artery: Secondary | ICD-10-CM | POA: Diagnosis not present

## 2018-12-31 DIAGNOSIS — E782 Mixed hyperlipidemia: Secondary | ICD-10-CM | POA: Diagnosis not present

## 2018-12-31 DIAGNOSIS — I10 Essential (primary) hypertension: Secondary | ICD-10-CM

## 2018-12-31 DIAGNOSIS — R000 Tachycardia, unspecified: Secondary | ICD-10-CM

## 2018-12-31 MED ORDER — METOPROLOL SUCCINATE ER 50 MG PO TB24: ORAL_TABLET | ORAL | 1 refills | Status: DC

## 2018-12-31 NOTE — Telephone Encounter (Signed)
New Message  1) Medication(s) Requested (by name): Metoprolol  2) Pharmacy of Choice: [facility] on [address]  3) Special Requests: Pt is requesting that her prescription be extended until her appt on 10/7   Approved medications will be sent to the pharmacy, we will reach out if there is an issue.  Requests made after 3pm may not be addressed until the following business day!  If a patient is unsure of the name of the medication(s) please note and ask patient to call back when they are able to provide all info, do not send to responsible party until all information is available!

## 2018-12-31 NOTE — Telephone Encounter (Signed)
Refills sent

## 2019-01-01 LAB — CBC
Hematocrit: 32 % — ABNORMAL LOW (ref 34.0–46.6)
Hemoglobin: 10.4 g/dL — ABNORMAL LOW (ref 11.1–15.9)
MCH: 26.4 pg — ABNORMAL LOW (ref 26.6–33.0)
MCHC: 32.5 g/dL (ref 31.5–35.7)
MCV: 81 fL (ref 79–97)
Platelets: 326 10*3/uL (ref 150–450)
RBC: 3.94 x10E6/uL (ref 3.77–5.28)
RDW: 14.9 % (ref 11.7–15.4)
WBC: 4.2 10*3/uL (ref 3.4–10.8)

## 2019-01-01 LAB — CMP14+EGFR
ALT: 17 IU/L (ref 0–32)
AST: 23 IU/L (ref 0–40)
Albumin/Globulin Ratio: 1.7 (ref 1.2–2.2)
Albumin: 3.8 g/dL (ref 3.8–4.9)
Alkaline Phosphatase: 76 IU/L (ref 39–117)
BUN/Creatinine Ratio: 17 (ref 9–23)
BUN: 13 mg/dL (ref 6–24)
Bilirubin Total: 0.2 mg/dL (ref 0.0–1.2)
CO2: 24 mmol/L (ref 20–29)
Calcium: 8.7 mg/dL (ref 8.7–10.2)
Chloride: 103 mmol/L (ref 96–106)
Creatinine, Ser: 0.78 mg/dL (ref 0.57–1.00)
GFR calc Af Amer: 102 mL/min/{1.73_m2} (ref >59)
GFR calc non Af Amer: 88 mL/min/{1.73_m2} (ref >59)
Globulin, Total: 2.3 g/dL (ref 1.5–4.5)
Glucose: 96 mg/dL (ref 65–99)
Potassium: 3.9 mmol/L (ref 3.5–5.2)
Sodium: 139 mmol/L (ref 134–144)
Total Protein: 6.1 g/dL (ref 6.0–8.5)

## 2019-01-01 LAB — LIPID PANEL
Chol/HDL Ratio: 2.6 ratio (ref 0.0–4.4)
Cholesterol, Total: 160 mg/dL (ref 100–199)
HDL: 62 mg/dL (ref >39)
LDL Chol Calc (NIH): 86 mg/dL (ref 0–99)
Triglycerides: 62 mg/dL (ref 0–149)
VLDL Cholesterol Cal: 12 mg/dL (ref 5–40)

## 2019-01-30 ENCOUNTER — Other Ambulatory Visit: Payer: Self-pay | Admitting: Nurse Practitioner

## 2019-01-30 DIAGNOSIS — I10 Essential (primary) hypertension: Secondary | ICD-10-CM

## 2019-01-30 DIAGNOSIS — R000 Tachycardia, unspecified: Secondary | ICD-10-CM

## 2019-01-30 MED ORDER — HYDROCHLOROTHIAZIDE 12.5 MG PO CAPS: 12.5000 mg | ORAL_CAPSULE | Freq: Every day | ORAL | 2 refills | Status: DC

## 2019-01-30 MED ORDER — METOPROLOL SUCCINATE ER 50 MG PO TB24: ORAL_TABLET | ORAL | 2 refills | Status: DC

## 2019-01-30 MED FILL — METOPROLOL SUCCINATE ER 50: 50 | 30 days supply | Qty: 30 | Fill #0

## 2019-01-30 MED FILL — HYDROCHLOROTHIAZIDE 12.5 MG: 12.5 | 30 days supply | Qty: 30 | Fill #0

## 2019-01-30 NOTE — Telephone Encounter (Signed)
Refills sent

## 2019-01-30 NOTE — Telephone Encounter (Signed)
1) Medication(s) Requested (by name): Cholecalciferol Metoprolol hydrochlorothiazide 2) Pharmacy of Choice:  [facility]

## 2019-01-31 ENCOUNTER — Other Ambulatory Visit: Payer: Self-pay | Admitting: Nurse Practitioner

## 2019-01-31 DIAGNOSIS — Z8673 Personal history of transient ischemic attack (TIA), and cerebral infarction without residual deficits: Secondary | ICD-10-CM

## 2019-01-31 DIAGNOSIS — I10 Essential (primary) hypertension: Secondary | ICD-10-CM

## 2019-01-31 DIAGNOSIS — I63531 Cerebral infarction due to unspecified occlusion or stenosis of right posterior cerebral artery: Secondary | ICD-10-CM

## 2019-01-31 MED FILL — CLOPIDOGREL 75 MG TABLET: 75 | 90 days supply | Qty: 90 | Fill #0

## 2019-02-06 ENCOUNTER — Encounter: Payer: BC Managed Care – PPO | Admitting: Nurse Practitioner

## 2019-02-08 ENCOUNTER — Encounter: Payer: Self-pay | Admitting: Nurse Practitioner

## 2019-02-08 ENCOUNTER — Ambulatory Visit: Payer: BC Managed Care – PPO | Attending: Nurse Practitioner | Admitting: Nurse Practitioner

## 2019-02-08 ENCOUNTER — Other Ambulatory Visit: Payer: Self-pay

## 2019-02-08 VITALS — BP 116/83 | HR 109 | Temp 98.6°F | Ht 67.0 in | Wt 213.8 lb

## 2019-02-08 DIAGNOSIS — Z0001 Encounter for general adult medical examination with abnormal findings: Secondary | ICD-10-CM

## 2019-02-08 DIAGNOSIS — D500 Iron deficiency anemia secondary to blood loss (chronic): Secondary | ICD-10-CM

## 2019-02-08 DIAGNOSIS — Z0000 Encounter for general adult medical examination without abnormal findings: Secondary | ICD-10-CM

## 2019-02-08 DIAGNOSIS — Z1211 Encounter for screening for malignant neoplasm of colon: Secondary | ICD-10-CM

## 2019-02-08 NOTE — Patient Instructions (Signed)
Preventing Iron Deficiency Anemia, Adult  Iron deficiency is having a lack of iron in the body. Iron is an important mineral that your body needs to build healthy red blood cells. Iron deficiency anemia is a condition in which the concentration of red blood cells or hemoglobin in the blood is below normal because of too little iron. Hemoglobin is a substance in red blood cells that carries oxygen to the body's tissues. You may develop iron deficiency anemia due to:  Blood loss from an injury or condition such as Crohn's disease.  Your body being unable to properly absorb iron and use it to create red blood cells.  Lack of iron in your diet. You can prevent iron deficiency anemia by making certain changes to your diet and lifestyle. What nutrition changes can be made?  Eat foods that are high in iron, such as: ? Red meat, especially liver and beef. ? Poultry. ? Seafood. ? Dried fruit. ? Prune juice. ? Nuts. ? Pumpkin seeds. ? Beans. ? Leafy green vegetables. ? Molasses. ? Tofu.  Look for foods that have added iron (are fortified). Many cereals and breads are iron fortified.  Eat foods that contain vitamin C along with iron-rich foods, preferably in the same meal. Vitamin C increases your body's ability to absorb iron. Foods high in vitamin C include: ? Citrus fruits, such as lemons, oranges, and grapefruits. ? Berries. ? Bell peppers. ? Tomatoes. ? Broccoli.  Do not follow a diet that is very low in fat or very high in fiber.  Do not drink very large amounts of milk, tea, or coffee. What actions can I take to lower my risk? It is important to know whether you are at risk for iron deficiency anemia. Ask your health care provider if you need a blood test to measure your iron or red blood cells. You may have a higher risk for iron deficiency anemia if:  You are a woman and one of the following applies: ? You have heavy menstrual periods. ? You are pregnant. ? You are  breastfeeding.  You have had bypass surgery for weight loss.  You have a digestive disorder such as Crohn's disease, irritable bowel syndrome, or celiac disease.  You regularly take antacids or acid-lowering medicines.  You follow a vegetarian or vegan diet. To lower your risk for iron deficiency:  If you are a vegetarian or vegan, talk with your health care provider or a dietitian about: ? Taking an iron supplement. ? Adding more iron-rich foods to your diet.  Limit your use of antacids or acid-lowering medicines.  If you have heavy menstrual periods, are pregnant, or are breastfeeding, ask your health care provider about taking an iron supplement.  Work with your health care provider to manage conditions that can cause iron deficiency.  Take over-the-counter and prescription medicines only as told by your health care provider.  Keep all follow-up visits as told by your health care provider. This is important. Why are these changes important? It is important to make these changes so that you do not develop iron deficiency anemia. Iron-rich foods help your body produce more red blood cells and have more energy. What can happen if changes are not made? If you do not make these changes, you could develop iron deficiency anemia. If not treated, iron deficiency anemia can lead to serious health complications, including:  Long-term (chronic) fatigue.  Shortness of breath.  Abnormal heart rhythms.  Heart failure.  Uncomfortable sensations and an overwhelming urge   to move your legs (restless legs syndrome).  Weakened disease-fighting system (immune system). Where to find more information Learn more about preventing iron deficiency from:  [place], Lung, and [place]: https://wilson-eaton.com/  American Society of Hematology: www.hematology.org Contact a health care provider if you:  Develop symptoms of iron deficiency, including: ? Fatigue. ? Headache. ? Pale skin,  lips, and nail beds. ? Poor appetite. ? Weakness. Summary  Iron deficiency anemia is a condition in which the concentration of red blood cells or hemoglobin in the blood is below normal because of too little iron.  You can help prevent iron deficiency anemia by eating more iron-rich foods, such as red meat, poultry, or tofu. Look for foods that have added iron (are fortified), such as cereals.  Eat foods that contain vitamin C along with iron-rich foods, preferably in the same meal. Vitamin C increases the body's ability to absorb iron.  Ask your health care provider about your risk for iron deficiency anemia and whether a multivitamin or supplement may be right for you. This information is not intended to replace advice given to you by your health care provider. Make sure you discuss any questions you have with your health care provider. Document Released: 02/16/2017 Document Revised: 08/10/2018 Document Reviewed: 02/16/2017 Elsevier Patient Education  2020 [employer].

## 2019-02-08 NOTE — Progress Notes (Signed)
Assessment & Plan:  Ruth Conley was seen today for annual exam.  Diagnoses and all orders for this visit:  Encounter for annual physical exam  Iron deficiency anemia due to chronic blood loss -     CBC  Colon cancer screening -     Fecal occult blood, imunochemical([facility])    Patient has been counseled on age-appropriate routine health concerns for screening and prevention. These are reviewed and up-to-date. Referrals have been placed accordingly. Immunizations are up-to-date or declined.    Subjective:   Chief Complaint  Patient presents with  . Annual Exam   HPI Ruth Conley 53 y.o. female presents to office today for annual exam.   has a past medical history of Carotid stenosis, Hypertension, Stroke [facility]), and TIA (transient ischemic attack) (2016). Blood pressure well controlled today. She is taking metoprolol xl however still tachycardic. May need referral to Cardiology. Unclear if she completed event monitoring that was recommended by them in 2018.  BP Readings from Last 3 Encounters:  02/08/19 116/83  05/07/18 (!) 129/91  02/19/18 112/75    Review of Systems  Constitutional: Negative for fever, malaise/fatigue and weight loss.  HENT: Negative.  Negative for nosebleeds.   Eyes: Negative.  Negative for blurred vision, double vision and photophobia.  Respiratory: Negative.  Negative for cough and shortness of breath.   Cardiovascular: Negative.  Negative for chest pain, palpitations and leg swelling.  Gastrointestinal: Negative.  Negative for heartburn, nausea and vomiting.  Genitourinary: Negative.   Musculoskeletal: Negative.  Negative for myalgias.  Skin: Negative.   Neurological: Negative.  Negative for dizziness, focal weakness, seizures and headaches.  Endo/Heme/Allergies: Negative.   Psychiatric/Behavioral: Negative.  Negative for suicidal ideas.    Past Medical History:  Diagnosis Date  . Carotid stenosis    a. 08/2014: dopplers showing  40-59% RICA stenosis and 123456 LICA stenosis  . Hypertension   . Stroke ([place])   . TIA (transient ischemic attack) 2016    Past Surgical History:  Procedure Laterality Date  . CESAREAN SECTION    . TRANSTHORACIC ECHOCARDIOGRAM  04/2015   (no change from 08/2014) Normal LV size and thickness. EF 60-65%. GR 1 DD. No wall motion abnormality. No source of embolism noted    Family History  Problem Relation Age of Onset  . Hypertension Mother   . CAD Father 44  . Stroke Paternal Uncle     Social History Reviewed with no changes to be made today.   Outpatient Medications Prior to Visit  Medication Sig Dispense Refill  . atorvastatin (LIPITOR) 10 MG tablet TAKE 1 TABLET BY MOUTH ONCE DAILY AT  6  00  PM 30 tablet 0  . Cholecalciferol (VITAMIN D3 PO) Take by mouth.    . clopidogrel (PLAVIX) 75 MG tablet Take 1 tablet by mouth once daily 90 tablet 0  . Coenzyme Q-10 100 MG capsule Take 100 mg by mouth daily. Reported on 11/10/2015    . ferrous sulfate 325 (65 FE) MG tablet Take 1 tablet (325 mg total) by mouth every other day. 90 tablet 2  . hydrochlorothiazide (MICROZIDE) 12.5 MG capsule Take 1 capsule (12.5 mg total) by mouth daily. 30 capsule 2  . lactobacillus acidophilus (BACID) TABS tablet Take 2 tablets by mouth 3 (three) times daily.    . metoprolol succinate (TOPROL-XL) 50 MG 24 hr tablet TAKE 1 TABLET BY MOUTH ONCE DAILY WITH  OR  FOLLOWING  A  MEAL 30 tablet 2  . Multiple Vitamins-Minerals (MULTIVITAMIN WITH MINERALS)  tablet Take 1 tablet by mouth daily.    . Omega 3 1200 MG CAPS Take 600 mg by mouth daily.    . vitamin B-12 (CYANOCOBALAMIN) 100 MCG tablet Take 100 mcg by mouth daily.     No facility-administered medications prior to visit.     Allergies  Allergen Reactions  . Lisinopril Cough       Objective:    BP 116/83 (BP Location: Left Arm, Patient Position: Sitting, Cuff Size: Large)   Pulse (!) 109   Temp 98.6 F (37 C) (Oral)   Ht 5\' 7"  (1.702 m)   Wt 213 lb  12.8 oz (97 kg)   SpO2 99%   BMI 33.49 kg/m  Wt Readings from Last 3 Encounters:  02/08/19 213 lb 12.8 oz (97 kg)  05/07/18 199 lb 12.8 oz (90.6 kg)  02/19/18 196 lb 6.4 oz (89.1 kg)    Physical Exam Constitutional:      Appearance: She is well-developed.  HENT:     Head: Normocephalic and atraumatic.     Right Ear: Hearing, ear canal and external ear normal. A middle ear effusion is present.     Left Ear: Hearing, ear canal and external ear normal. A middle ear effusion is present.     Nose: Nose normal.     Mouth/Throat:     Lips: Pink.     Mouth: Mucous membranes are moist.     Dentition: Normal dentition.     Pharynx: Oropharynx is clear. No oropharyngeal exudate.  Eyes:     General: No scleral icterus.       Right eye: No discharge.     Conjunctiva/sclera: Conjunctivae normal.     Pupils: Pupils are equal, round, and reactive to light.  Neck:     Musculoskeletal: Normal range of motion and neck supple.     Thyroid: No thyromegaly.     Trachea: No tracheal deviation.  Cardiovascular:     Rate and Rhythm: Regular rhythm. Tachycardia present.     Heart sounds: Normal heart sounds. No murmur. No friction rub.  Pulmonary:     Effort: Pulmonary effort is normal. No accessory muscle usage or respiratory distress.     Breath sounds: Normal breath sounds. No decreased breath sounds, wheezing, rhonchi or rales.  Chest:     Chest wall: No tenderness.  Abdominal:     General: Bowel sounds are normal. There is no distension.     Palpations: Abdomen is soft. There is no mass.     Tenderness: There is no abdominal tenderness. There is no guarding or rebound.  Musculoskeletal: Normal range of motion.        General: No tenderness or deformity.     Right lower leg: No edema.     Left lower leg: No edema.  Lymphadenopathy:     Cervical: No cervical adenopathy.  Skin:    General: Skin is warm and dry.     Findings: No erythema.  Neurological:     Mental Status: She is alert  and oriented to person, place, and time.     Cranial Nerves: Cranial nerves are intact. No cranial nerve deficit.     Sensory: Sensation is intact.     Motor: Motor function is intact.     Coordination: Coordination is intact. Coordination normal.     Gait: Gait is intact.     Deep Tendon Reflexes:     Reflex Scores:      Patellar reflexes are 1+ on the  right side and 1+ on the left side. Psychiatric:        Speech: Speech normal.        Behavior: Behavior normal.        Thought Content: Thought content normal.        Judgment: Judgment normal.          Patient has been counseled extensively about nutrition and exercise as well as the importance of adherence with medications and regular follow-up. The patient was given clear instructions to go to ER or return to medical center if symptoms don't improve, worsen or new problems develop. The patient verbalized understanding.   Follow-up: Return in about 6 months (around 08/09/2019).   Gildardo Pounds, FNP-BC [facility] and [place], [place]   02/09/2019, 6:36 PM

## 2019-02-09 ENCOUNTER — Encounter: Payer: Self-pay | Admitting: Nurse Practitioner

## 2019-02-09 LAB — CBC
Hematocrit: 35 % (ref 34.0–46.6)
Hemoglobin: 11.6 g/dL (ref 11.1–15.9)
MCH: 26.7 pg (ref 26.6–33.0)
MCHC: 33.1 g/dL (ref 31.5–35.7)
MCV: 81 fL (ref 79–97)
Platelets: 362 10*3/uL (ref 150–450)
RBC: 4.34 x10E6/uL (ref 3.77–5.28)
RDW: 15.9 % — ABNORMAL HIGH (ref 11.7–15.4)
WBC: 4.7 10*3/uL (ref 3.4–10.8)

## 2019-02-27 MED FILL — HYDROCHLOROTHIAZIDE 12.5 MG: 12.5 | 60 days supply | Qty: 60 | Fill #1

## 2019-02-27 MED FILL — METOPROLOL SUCCINATE ER 50: 50 | 60 days supply | Qty: 60 | Fill #1

## 2019-03-06 ENCOUNTER — Institutional Professional Consult (permissible substitution): Payer: BC Managed Care – PPO | Admitting: Neurology

## 2019-04-03 ENCOUNTER — Other Ambulatory Visit: Payer: Self-pay | Admitting: Nurse Practitioner

## 2019-04-03 DIAGNOSIS — I63531 Cerebral infarction due to unspecified occlusion or stenosis of right posterior cerebral artery: Secondary | ICD-10-CM

## 2019-04-03 DIAGNOSIS — E782 Mixed hyperlipidemia: Secondary | ICD-10-CM

## 2019-04-03 DIAGNOSIS — Z8673 Personal history of transient ischemic attack (TIA), and cerebral infarction without residual deficits: Secondary | ICD-10-CM

## 2019-04-17 ENCOUNTER — Telehealth: Payer: Self-pay | Admitting: Neurology

## 2019-04-17 NOTE — Telephone Encounter (Signed)
Called the patient to offer a apt to move her up from wait list. There was no answer. LVM for the patient to call back. If pt calls back and there are openings available please offer to the patient.

## 2019-04-23 ENCOUNTER — Other Ambulatory Visit: Payer: Self-pay | Admitting: Family Medicine

## 2019-04-23 ENCOUNTER — Other Ambulatory Visit: Payer: Self-pay | Admitting: Nurse Practitioner

## 2019-04-23 DIAGNOSIS — Z8673 Personal history of transient ischemic attack (TIA), and cerebral infarction without residual deficits: Secondary | ICD-10-CM

## 2019-04-23 DIAGNOSIS — I10 Essential (primary) hypertension: Secondary | ICD-10-CM

## 2019-04-23 DIAGNOSIS — R000 Tachycardia, unspecified: Secondary | ICD-10-CM

## 2019-04-23 DIAGNOSIS — I63531 Cerebral infarction due to unspecified occlusion or stenosis of right posterior cerebral artery: Secondary | ICD-10-CM

## 2019-04-23 DIAGNOSIS — E782 Mixed hyperlipidemia: Secondary | ICD-10-CM

## 2019-04-23 MED FILL — METOPROLOL SUCCINATE ER 50: 50 | 90 days supply | Qty: 90 | Fill #0

## 2019-04-23 MED FILL — HYDROCHLOROTHIAZIDE 12.5 MG: 12.5 | 90 days supply | Qty: 90 | Fill #0

## 2019-04-24 ENCOUNTER — Other Ambulatory Visit: Payer: Self-pay | Admitting: Pharmacist

## 2019-04-24 DIAGNOSIS — I63531 Cerebral infarction due to unspecified occlusion or stenosis of right posterior cerebral artery: Secondary | ICD-10-CM

## 2019-04-24 DIAGNOSIS — Z8673 Personal history of transient ischemic attack (TIA), and cerebral infarction without residual deficits: Secondary | ICD-10-CM

## 2019-04-24 MED ORDER — CLOPIDOGREL BISULFATE 75 MG PO TABS: 75.0000 mg | ORAL_TABLET | Freq: Every day | ORAL | 1 refills | Status: DC

## 2019-04-24 MED FILL — CLOPIDOGREL 75 MG TABLET: 75 | 90 days supply | Qty: 90 | Fill #0

## 2019-04-30 MED FILL — ATORVASTATIN 10 MG TABLET: 10 | 90 days supply | Qty: 90 | Fill #0 | Status: TO

## 2019-05-27 ENCOUNTER — Other Ambulatory Visit: Payer: Self-pay

## 2019-05-27 ENCOUNTER — Ambulatory Visit (INDEPENDENT_AMBULATORY_CARE_PROVIDER_SITE_OTHER): Payer: BC Managed Care – PPO | Admitting: Neurology

## 2019-05-27 ENCOUNTER — Encounter: Payer: Self-pay | Admitting: Neurology

## 2019-05-27 VITALS — BP 115/79 | HR 110 | Temp 97.6°F | Ht 67.0 in | Wt 210.0 lb

## 2019-05-27 DIAGNOSIS — E6609 Other obesity due to excess calories: Secondary | ICD-10-CM | POA: Diagnosis not present

## 2019-05-27 DIAGNOSIS — I10 Essential (primary) hypertension: Secondary | ICD-10-CM | POA: Diagnosis not present

## 2019-05-27 DIAGNOSIS — R200 Anesthesia of skin: Secondary | ICD-10-CM

## 2019-05-27 DIAGNOSIS — Z6832 Body mass index (BMI) 32.0-32.9, adult: Secondary | ICD-10-CM

## 2019-05-27 DIAGNOSIS — G459 Transient cerebral ischemic attack, unspecified: Secondary | ICD-10-CM

## 2019-05-27 NOTE — Patient Instructions (Signed)
Transient Ischemic Attack  A transient ischemic attack (TIA) is a "warning stroke" that causes stroke-like symptoms that go away quickly. A TIA does not cause lasting damage to the brain. But having a TIA is a sign that you may be at risk for a stroke. Lifestyle changes and medical treatments can help prevent a stroke. It is important to know the symptoms of a TIA and what to do. Get help right away, even if your symptoms go away. The symptoms of a TIA are the same as those of a stroke. They can happen fast, and they usually go away within minutes or hours. They can include:  Weakness or loss of feeling in your face, arm, or leg. This often happens on one side of your body.  Trouble walking.  Trouble moving your arms or legs.  Trouble talking or understanding what people are saying.  Trouble seeing.  Seeing two of one object (double vision).  Feeling dizzy.  Feeling confused.  Loss of balance or coordination.  Feeling sick to your stomach (nauseous) and throwing up (vomiting).  A very bad headache for no reason. What increases the risk? Certain things may make you more likely to have a TIA. Some of these are things that you can change, such as:  Being very overweight (obese).  Using products that contain nicotine or tobacco, such as cigarettes and e-cigarettes.  Taking birth control pills.  Not being active.  Drinking too much alcohol.  Using drugs. Other risk factors include:  Having an irregular heartbeat (atrial fibrillation).  Being African American or Hispanic.  Having had blood clots, stroke, TIA, or heart attack in the past.  Being a woman with a history of high blood pressure in pregnancy (preeclampsia).  Being over the age of 60.  Being female.  Having family history of stroke.  Having the following diseases or conditions: ? High blood pressure. ? High cholesterol. ? Diabetes. ? Heart disease. ? Sickle cell disease. ? Sleep apnea. ? Migraine  headache. ? Long-term (chronic) diseases that cause soreness and swelling (inflammation). ? Disorders that affect how your blood clots. Follow these instructions at home: Medicines   Take over-the-counter and prescription medicines only as told by your doctor.  If you were told to take aspirin or another medicine to thin your blood, take it exactly as told by your doctor. ? Taking too much of the medicine can cause bleeding. ? Taking too little of the medicine may not work to treat the problem. Eating and drinking   Eat 5 or more servings of fruits and vegetables each day.  Follow instructions from your doctor about your diet. You may need to follow a certain diet to help lower your risk of having a stroke. You may need to: ? Eat a diet that is low in fat and salt. ? Eat foods that contain a lot of fiber. ? Limit the amount of carbohydrates and sugar in your diet.  Limit alcohol intake to 1 drink a day for nonpregnant women and 2 drinks a day for men. One drink equals 12 oz of beer, 5 oz of wine, or 1 oz of hard liquor. General instructions  Keep a healthy weight.  Stay active. Try to get at least 30 minutes of activity on all or most days.  Find out if you have a condition called sleep apnea. Get treatment if needed.  Do not use any products that contain nicotine or tobacco, such as cigarettes and e-cigarettes. If you need help quitting,   ask your doctor.  Do not abuse drugs.  Keep all follow-up visits as told by your doctor. This is important. Get help right away if:  You have any signs of stroke. "BE FAST" is an easy way to remember the main warning signs: ? B - Balance. Signs are dizziness, sudden trouble walking, or loss of balance. ? E - Eyes. Signs are trouble seeing or a sudden change in how you see. ? F - Face. Signs are sudden weakness or loss of feeling of the face, or the face or eyelid drooping on one side. ? A - Arms. Signs are weakness or loss of feeling in an  arm. This happens suddenly and usually on one side of the body. ? S - Speech. Signs are sudden trouble speaking, slurred speech, or trouble understanding what people say. ? T - Time. Time to call emergency services. Write down what time symptoms started.  You have other signs of stroke, such as: ? A sudden, very bad headache with no known cause. ? Feeling sick to your stomach (nausea). ? Throwing up (vomiting). ? Jerky movements that you cannot control (seizure). These symptoms may be an emergency. Do not wait to see if the symptoms will go away. Get medical help right away. Call your local emergency services (911 in the [place] Do not drive yourself to the hospital. Summary  A transient ischemic attack (TIA) is a "warning stroke" that causes stroke-like symptoms that go away quickly.  A TIA is a medical emergency. Get help right away, even if your symptoms go away.  A TIA does not cause lasting damage to the brain.  Having a TIA is a sign that you may be at risk for a stroke. Lifestyle changes and medical treatments can help prevent a stroke. This information is not intended to replace advice given to you by your health care provider. Make sure you discuss any questions you have with your health care provider. Document Revised: 01/12/2018 Document Reviewed: 07/20/2016 Elsevier Patient Education  2020 [employer]  

## 2019-05-27 NOTE — Progress Notes (Signed)
SLEEP MEDICINE CLINIC    Provider:  Larey Seat, MD  Primary Care Physician:  Gildardo Pounds, NP [place] [address]     Referring Provider: Gildardo Pounds, [place],  [place] [address]          Chief Complaint according to patient   Patient presents with:    . New Patient (Initial Visit)     Not sure why patient was referred- she is an established STROKE patient -  She should have seen NP stroke team.      HISTORY OF PRESENT ILLNESS:  Ruth Conley is a 54 y.o. year old African American female patient and seen here on 1/25/2021Chief concern according to patient :  PCP wanted her to touch base and follow up - the  patient states no concerns or issues related to her past stroke and her PSG was negative. She has no concerns related to sleep. She has seen Dr Erlinda Hong in Summer 2018.    I have the pleasure of seeing Ruth Conley today, a right -handed  African American female . She  has a past medical history of Carotid stenosis, Hypertension, Stroke ([place]), and TIA (transient ischemic attack) (2016).  Dr. Isabell Jarvis notes stated that Ruth Conley was 54 years old at the time of his last encounter Ruth Conley has a history of hypertension, elevated LDL, a TIA in April 2016 with right-sided body symptoms she was admitted in December 2016 for left-sided tingling and left leg weakness.  MRI showed right thalamic small infarct other stroke work-up included MRA, head and neck.  A 2D echo and her blood sugar A1c level.  Hypercoagulability work-up and autoimmune factor work-up were all negative.  She was changed at the time from aspirin to Plavix and Lipitor have been added.  She had reportedly palpitations about once a month but her heart rate would go up to 130s but she was discharged in good condition from the hospital after the TIA and has not had another one since.  His physical exam at the time did not show any focal neurologic deficits.  MRI and MRA  were reviewed.  2D echo.  She was referred for sleep studies under my guidance on 1-29-her AHI was 2.8 and during REM 7.0/h this was not severe enough to even consider treatment snoring had been noted but was not causing arousals.  She is now referred by [facility] nurse practitioner Geryl Rankins referred there was no focal neurologic deficit, no musculoskeletal abnormality, and a return visit in about 6 months has been arranged around April of this year but there were no neurologic concerns voiced by the nurse practitioner.  Her history of stroke and TIA have been without lasting impairment, she has had no new neurologic symptoms.-29-2017    Social history:  Patient is working from home 75% of the time, and lives in a household with one son . Family status is divorced, with 40 and 51 year old children, 1 granddaughter. The patient currently Pets are not present. Tobacco use: none   ETOH use : none  Caffeine intake in form of Coffee( rare ) Soda(none ) Tea ( none ) nor energy drinks. Regular exercise in form of  4 days weekly, at home       Sleep habits are as follows: The patient's dinner time is between 6 PM. The patient goes to bed at 9.30 PM and continues to sleep for  7 hours. The preferred sleep position is left sided, with the support of 2 pillows. Dreams are reportedly frequent/vivid.  6.30  AM is the usual rise time.  The patient wakes up spontaneously.  She reports  feeling refreshed and  restored in AM, withot symptoms such as dry mouth, morning headaches or residual fatigue. No naps.    Review of Systems: Out of a complete 14 system review, the patient complains of only the following symptoms, and all other reviewed systems are negative.:      Social History   Socioeconomic History  . Marital status: Single    Spouse name: Not on file  . Number of children: Not on file  . Years of education: Not on file  . Highest education level: Not on file   Occupational History  . Not on file  Tobacco Use  . Smoking status: Never Smoker  . Smokeless tobacco: Never Used  Substance and Sexual Activity  . Alcohol use: No  . Drug use: No  . Sexual activity: Not Currently    Birth control/protection: None  Other Topics Concern  . Not on file  Social History Narrative  . Not on file   Social Determinants of Health   Financial Resource Strain:   . Difficulty of Paying Living Expenses: Not on file  Food Insecurity:   . Worried About Charity fundraiser in the Last Year: Not on file  . Ran Out of Food in the Last Year: Not on file  Transportation Needs:   . Lack of Transportation (Medical): Not on file  . Lack of Transportation (Non-Medical): Not on file  Physical Activity:   . Days of Exercise per Week: Not on file  . Minutes of Exercise per Session: Not on file  Stress:   . Feeling of Stress : Not on file  Social Connections:   . Frequency of Communication with Friends and Family: Not on file  . Frequency of Social Gatherings with Friends and Family: Not on file  . Attends Religious Services: Not on file  . Active Member of Clubs or Organizations: Not on file  . Attends Archivist Meetings: Not on file  . Marital Status: Not on file    Family History  Problem Relation Age of Onset  . Hypertension Mother   . CAD Father 58  . Stroke Paternal Uncle     Past Medical History:  Diagnosis Date  . Carotid stenosis    a. 08/2014: dopplers showing 40-59% RICA stenosis and 123456 LICA stenosis  . Hypertension   . Stroke ([place])   . TIA (transient ischemic attack) 2016    Past Surgical History:  Procedure Laterality Date  . CESAREAN SECTION    . TRANSTHORACIC ECHOCARDIOGRAM  04/2015   (no change from 08/2014) Normal LV size and thickness. EF 60-65%. GR 1 DD. No wall motion abnormality. No source of embolism noted     Current Outpatient Medications on File Prior to Visit  Medication Sig Dispense Refill  . atorvastatin  (LIPITOR) 10 MG tablet TAKE 1 TABLET BY MOUTH DAILY AT 6 PM. 90 tablet 2  . Cholecalciferol (VITAMIN D3 PO) Take by mouth.    . clopidogrel (PLAVIX) 75 MG tablet Take 1 tablet (75 mg total) by mouth daily. 90 tablet 1  . Coenzyme Q-10 100 MG capsule Take 100 mg by mouth daily. Reported on 11/10/2015    . ferrous sulfate 325 (65 FE) MG tablet Take 1 tablet (325 mg total) by mouth every  other day. 90 tablet 2  . hydrochlorothiazide (MICROZIDE) 12.5 MG capsule TAKE 1 CAPSULE BY MOUTH DAILY. 60 capsule 2  . lactobacillus acidophilus (BACID) TABS tablet Take 2 tablets by mouth 3 (three) times daily.    . metoprolol succinate (TOPROL-XL) 50 MG 24 hr tablet TAKE 1 TABLET BY MOUTH ONCE DAILY WITH OR FOLLOWING A MEAL 60 tablet 2  . Multiple Vitamins-Minerals (MULTIVITAMIN WITH MINERALS) tablet Take 1 tablet by mouth daily.    . Omega 3 1200 MG CAPS Take 600 mg by mouth daily.    . vitamin B-12 (CYANOCOBALAMIN) 100 MCG tablet Take 100 mcg by mouth daily.     No current facility-administered medications on file prior to visit.    Allergies  Allergen Reactions  . Lisinopril Cough    Physical exam:  Today's Vitals   05/27/19 0928  BP: 115/79  Pulse: (!) 110  Temp: 97.6 F (36.4 C)  Weight: 210 lb (95.3 kg)  Height: 5\' 7"  (1.702 m)   Body mass index is 32.89 kg/m.   Wt Readings from Last 3 Encounters:  05/27/19 210 lb (95.3 kg)  02/08/19 213 lb 12.8 oz (97 kg)  05/07/18 199 lb 12.8 oz (90.6 kg)     Ht Readings from Last 3 Encounters:  05/27/19 5\' 7"  (1.702 m)  02/08/19 5\' 7"  (1.702 m)  02/19/18 5\' 7"  (1.702 m)      General: The patient is awake, alert and appears not in acute distress. The patient is well groomed. Head: Normocephalic, atraumatic. Neck is supple. Mallampati 2,   Cardiovascular:  Regular rate and cardiac rhythm by pulse,  without distended neck veins. Respiratory: Lungs are clear to auscultation.  Skin:  Without evidence of ankle edema, or rash. Trunk: The  patient's posture is erect.   Neurologic exam : The patient is awake and alert, oriented to place and time.   Memory subjective described as intact.  Attention span & concentration ability appears normal.  Speech is fluent,  without dysarthria, dysphonia or aphasia.  Mood and affect are appropriate.   Cranial nerves: no loss of smell or taste reported  Pupils are equal and briskly reactive to light. Funduscopic exam deferred.  Extraocular movements in vertical and horizontal planes were intact and without nystagmus. No Diplopia. Visual fields by finger perimetry are intact. Hearing was intact to soft voice and finger rubbing. Facial sensation intact to fine touch. Facial motor strength is symmetric and tongue and uvula move midline.  Neck ROM : rotation, tilt and flexion extension were normal for age and shoulder shrug was symmetrical.    Motor exam:  Symmetric bulk, tone and ROM.   Normal tone without cog wheeling, symmetric grip strength .   Sensory:  Fine touch, pinprick and vibration were tested  and  normal.  Proprioception tested in the upper extremities was normal.   Coordination: Rapid alternating movements in the fingers/hands were of normal speed.  The Finger-to-nose maneuver was intact without evidence of ataxia, dysmetria or tremor.   Gait and station: Patient could rise unassisted from a seated position, walked without assistive device.  Stance is of normal width/ base and the patient turned with 3 steps.  Toe and heel walk were deferred.  Deep tendon reflexes: in the  upper and lower extremities are symmetric and intact.  Babinski response was deferred.    After spending a total time of 30 minutes face to face and additional time for physical and neurologic examination, review of laboratory studies,  personal review  of imaging studies, reports and results of other testing and review of referral information / records as far as provided in visit, I have established the  following assessments:  1) the patient has no sleep concerns, no neurologic concerns and is asymptomatic.  2) Hr last carotid doppler was in 2016, right sided stenosis of possibly 59% , left ,much less. That is the only test worth repeating.    My Plan is to proceed with:  1) Carotid doppler study - her follow up will be with NP McC[place].    I would like to thank Gildardo Pounds, NP  for allowing me to meet with and to take care of this pleasant patient.   In short, Ruth Conley is presenting without symptoms or concerns to my sleep clininc, CC: I will share my notes with Dr Leonie Man and NP Darden Dates.   Electronically signed by: Larey Seat, MD 05/27/2019 9:44 AM  [facility] Board certified by The [employer] of Sleep Medicine and Diplomate of the [employer] of Sleep Medicine. Board certified In Neurology through the [place], Fellow of the [employer] of Neurology. Medical Director of [employer] [employer].

## 2019-07-16 ENCOUNTER — Ambulatory Visit: Payer: BC Managed Care – PPO | Admitting: Cardiology

## 2019-07-23 ENCOUNTER — Other Ambulatory Visit: Payer: Self-pay | Admitting: Nurse Practitioner

## 2019-07-23 DIAGNOSIS — D500 Iron deficiency anemia secondary to blood loss (chronic): Secondary | ICD-10-CM

## 2019-07-23 MED FILL — METOPROLOL SUCCINATE ER 50: 50 | 90 days supply | Qty: 90 | Fill #1

## 2019-07-23 MED FILL — ATORVASTATIN 10 MG TABLET: 10 | 90 days supply | Qty: 90 | Fill #1 | Status: TO

## 2019-07-23 MED FILL — CLOPIDOGREL 75 MG TABLET: 75 | 90 days supply | Qty: 90 | Fill #1

## 2019-07-26 MED FILL — HYDROCHLOROTHIAZIDE 12.5 MG: 12.5 | 90 days supply | Qty: 90 | Fill #1

## 2019-08-22 ENCOUNTER — Telehealth: Payer: Self-pay | Admitting: Adult Health

## 2019-08-22 NOTE — Telephone Encounter (Signed)
I called patient to confirm her 4/26 appointment. Patient states she would like to cancel it because of her work schedule. She states that she does not wish to reschedule right now.

## 2019-08-26 ENCOUNTER — Ambulatory Visit: Payer: BC Managed Care – PPO | Admitting: Adult Health

## 2019-09-02 ENCOUNTER — Ambulatory Visit: Payer: BC Managed Care – PPO | Admitting: Cardiology

## 2019-10-01 ENCOUNTER — Encounter: Payer: Self-pay | Admitting: Nurse Practitioner

## 2019-10-02 ENCOUNTER — Other Ambulatory Visit: Payer: Self-pay | Admitting: Nurse Practitioner

## 2019-10-02 DIAGNOSIS — I10 Essential (primary) hypertension: Secondary | ICD-10-CM

## 2019-10-02 DIAGNOSIS — R000 Tachycardia, unspecified: Secondary | ICD-10-CM

## 2019-10-02 MED ORDER — METOPROLOL SUCCINATE ER 50 MG PO TB24: 50.0000 mg | ORAL_TABLET | Freq: Every day | ORAL | 2 refills | Status: DC

## 2019-10-02 MED ORDER — METOPROLOL SUCCINATE ER 50 MG PO TB24: ORAL_TABLET | ORAL | 2 refills | Status: DC

## 2019-10-25 ENCOUNTER — Other Ambulatory Visit: Payer: Self-pay | Admitting: Nurse Practitioner

## 2019-10-25 DIAGNOSIS — I10 Essential (primary) hypertension: Secondary | ICD-10-CM

## 2019-10-25 DIAGNOSIS — I63531 Cerebral infarction due to unspecified occlusion or stenosis of right posterior cerebral artery: Secondary | ICD-10-CM

## 2019-10-25 DIAGNOSIS — Z8673 Personal history of transient ischemic attack (TIA), and cerebral infarction without residual deficits: Secondary | ICD-10-CM

## 2019-10-25 MED FILL — CLOPIDOGREL 75 MG TABLET: 75 | 30 days supply | Qty: 30 | Fill #0

## 2019-10-25 MED FILL — ATORVASTATIN 10 MG TABLET: 10 | 90 days supply | Qty: 90 | Fill #2 | Status: TO

## 2019-10-25 MED FILL — HYDROCHLOROTHIAZIDE 12.5 MG: 12.5 | 30 days supply | Qty: 30 | Fill #0

## 2019-10-28 ENCOUNTER — Other Ambulatory Visit: Payer: Self-pay | Admitting: Nurse Practitioner

## 2019-10-28 ENCOUNTER — Other Ambulatory Visit: Payer: Self-pay | Admitting: Family Medicine

## 2019-10-28 DIAGNOSIS — I10 Essential (primary) hypertension: Secondary | ICD-10-CM

## 2019-10-28 DIAGNOSIS — I63531 Cerebral infarction due to unspecified occlusion or stenosis of right posterior cerebral artery: Secondary | ICD-10-CM

## 2019-10-28 DIAGNOSIS — Z8673 Personal history of transient ischemic attack (TIA), and cerebral infarction without residual deficits: Secondary | ICD-10-CM

## 2019-10-28 MED ORDER — HYDROCHLOROTHIAZIDE 12.5 MG PO CAPS: 12.5000 mg | ORAL_CAPSULE | Freq: Every day | ORAL | 0 refills | Status: DC

## 2019-11-29 ENCOUNTER — Other Ambulatory Visit: Payer: Self-pay | Admitting: Nurse Practitioner

## 2019-11-29 ENCOUNTER — Encounter: Payer: Self-pay | Admitting: Nurse Practitioner

## 2019-11-29 ENCOUNTER — Encounter: Payer: BC Managed Care – PPO | Admitting: Nurse Practitioner

## 2019-11-29 ENCOUNTER — Other Ambulatory Visit: Payer: Self-pay | Admitting: Family Medicine

## 2019-11-29 DIAGNOSIS — R000 Tachycardia, unspecified: Secondary | ICD-10-CM

## 2019-11-29 DIAGNOSIS — I10 Essential (primary) hypertension: Secondary | ICD-10-CM

## 2019-11-29 DIAGNOSIS — E782 Mixed hyperlipidemia: Secondary | ICD-10-CM

## 2019-11-29 DIAGNOSIS — I63531 Cerebral infarction due to unspecified occlusion or stenosis of right posterior cerebral artery: Secondary | ICD-10-CM

## 2019-11-29 DIAGNOSIS — Z8673 Personal history of transient ischemic attack (TIA), and cerebral infarction without residual deficits: Secondary | ICD-10-CM

## 2019-11-29 NOTE — Telephone Encounter (Signed)
Patient called to remind the doctor that she is all out of her medication, Metoprolol and she would like it filled today if possible.

## 2019-11-29 NOTE — Telephone Encounter (Signed)
Requested medication (s) are due for refill today: no  Requested medication (s) are on the active medication list: yes  Last refill: 07/23/2019  Future visit scheduled: yes  Notes to clinic:  patient has appointment this  afternoon   Requested Prescriptions  Pending Prescriptions Disp Refills   metoprolol succinate (TOPROL-XL) 50 MG 24 hr tablet [Pharmacy Med Name: METOPROLOL SUCCINATE ER 50 50 Tablet] 90 tablet 2    Sig: TAKE 1 TABLET BY MOUTH ONCE DAILY WITH OR FOLLOWING A MEAL      Cardiovascular:  Beta Blockers Failed - 11/29/2019 11:28 AM      Failed - Valid encounter within last 6 months    Recent Outpatient Visits           9 months ago Encounter for annual physical exam   [place], [place] W, NP   11 months ago Cerebrovascular accident (CVA) due to occlusion of right posterior cerebral artery [facility])   [place], Vernia Buff, NP   1 year ago Well woman exam without gynecological exam   [place], [place] W, NP   2 years ago Essential hypertension   [place], Vernia Buff, NP   3 years ago Essential hypertension   [place], [place] T, MD       Future Appointments             Today Gildardo Pounds, NP [place]   In 2 months Gildardo Pounds, NP [place] BP in normal range    BP Readings from Last 1 Encounters:  05/27/19 115/79          Passed - Last Heart Rate in normal range    Pulse Readings from Last 1 Encounters:  05/27/19 (!) 110

## 2019-11-29 NOTE — Telephone Encounter (Signed)
Pt was scheduled for a physical today (11/29/2019). She did not show-up to that appointment. Will forward request to [place].

## 2019-11-29 NOTE — Telephone Encounter (Signed)
Pt was scheduled for a physical today (11/29/2019). She did not show-up to that appointment. Will forward request to [place].

## 2019-11-29 NOTE — Telephone Encounter (Signed)
Requested medication (s) are due for refill today:  Yes  Requested medication (s) are on the active medication list:  Yes  Future visit scheduled:  Yes - has Physical today  Last Refill: Atorvastatin 04/23/19; #90; RF x 2                    HCTZ          10/28/19; #30; 0 refills  Note to clinic:  due to pt. Having physical today, sent for PCP review.   Requested Prescriptions  Pending Prescriptions Disp Refills   hydrochlorothiazide (MICROZIDE) 12.5 MG capsule [Pharmacy Med Name: HYDROCHLOROTHIAZIDE 12.5 MG 12.5 Capsule] 30 capsule 0    Sig: TAKE 1 CAPSULE BY MOUTH DAILY.      Cardiovascular: Diuretics - Thiazide Failed - 11/29/2019 11:38 AM      Failed - Valid encounter within last 6 months    Recent Outpatient Visits           9 months ago Encounter for annual physical exam   [place], Vernia Buff, NP   11 months ago Cerebrovascular accident (CVA) due to occlusion of right posterior cerebral artery [facility])   [place], Vernia Buff, NP   1 year ago Well woman exam without gynecological exam   [place], Vernia Buff, NP   2 years ago Essential hypertension   [place], Vernia Buff, NP   3 years ago Essential hypertension   [place], [place] T, MD       Future Appointments             Today Gildardo Pounds, NP [place]   In 2 months Gildardo Pounds, NP [place] in normal range and within 360 days    Calcium  Date Value Ref Range Status  12/31/2018 8.7 8.7 - 10.2 mg/dL Final   Calcium, Ion  Date Value Ref Range Status  10/20/2007 1.15  Final          Passed - Cr in normal range and within 360 days    Creat  Date Value Ref Range Status  11/10/2015 0.92 0.50 - 1.10 mg/dL Final   Creatinine,  Ser  Date Value Ref Range Status  12/31/2018 0.78 0.57 - 1.00 mg/dL Final          Passed - K in normal range and within 360 days    Potassium  Date Value Ref Range Status  12/31/2018 3.9 3.5 - 5.2 mmol/L Final          Passed - Na in normal range and within 360 days    Sodium  Date Value Ref Range Status  12/31/2018 139 134 - 144 mmol/L Final          Passed - Last BP in normal range    BP Readings from Last 1 Encounters:  05/27/19 115/79            atorvastatin (LIPITOR) 10 MG tablet [Pharmacy Med Name: ATORVASTATIN 10 MG TABLET 10 Tablet] 90 tablet 2    Sig: TAKE 1 TABLET BY MOUTH DAILY AT 6 PM.      Cardiovascular:  Antilipid - Statins Failed - 11/29/2019 11:38 AM      Failed -  LDL in normal range and within 360 days    LDL Chol Calc (NIH)  Date Value Ref Range Status  12/31/2018 86 0 - 99 mg/dL Final          Passed - Total Cholesterol in normal range and within 360 days    Cholesterol, Total  Date Value Ref Range Status  12/31/2018 160 100 - 199 mg/dL Final          Passed - HDL in normal range and within 360 days    HDL  Date Value Ref Range Status  12/31/2018 62 >39 mg/dL Final          Passed - Triglycerides in normal range and within 360 days    Triglycerides  Date Value Ref Range Status  12/31/2018 62 0 - 149 mg/dL Final          Passed - Patient is not pregnant      Passed - Valid encounter within last 12 months    Recent Outpatient Visits           9 months ago Encounter for annual physical exam   [place], [place] W, NP   11 months ago Cerebrovascular accident (CVA) due to occlusion of right posterior cerebral artery [facility])   [place], Vernia Buff, NP   1 year ago Well woman exam without gynecological exam   [place], Vernia Buff, NP   2 years ago Essential hypertension   [place],  Vernia Buff, NP   3 years ago Essential hypertension   [place], [place] T, MD       Future Appointments             Today Gildardo Pounds, NP [place]   In 2 months Gildardo Pounds, NP [place]

## 2019-11-30 ENCOUNTER — Encounter: Payer: Self-pay | Admitting: Nurse Practitioner

## 2019-11-30 ENCOUNTER — Other Ambulatory Visit: Payer: Self-pay | Admitting: Nurse Practitioner

## 2019-11-30 DIAGNOSIS — Z8673 Personal history of transient ischemic attack (TIA), and cerebral infarction without residual deficits: Secondary | ICD-10-CM

## 2019-11-30 DIAGNOSIS — I63531 Cerebral infarction due to unspecified occlusion or stenosis of right posterior cerebral artery: Secondary | ICD-10-CM

## 2019-11-30 MED ORDER — METOPROLOL SUCCINATE ER 50 MG PO TB24: 50.0000 mg | ORAL_TABLET | Freq: Every day | ORAL | 2 refills | Status: DC

## 2019-11-30 MED ORDER — METOPROLOL SUCCINATE ER 50 MG PO TB24: 50.0000 mg | ORAL_TABLET | Freq: Every day | ORAL | 1 refills | Status: DC

## 2019-11-30 MED ORDER — CLOPIDOGREL BISULFATE 75 MG PO TABS: 75.0000 mg | ORAL_TABLET | Freq: Every day | ORAL | 1 refills | Status: DC

## 2019-12-02 MED FILL — CLOPIDOGREL 75 MG TABLET: 75 | 90 days supply | Qty: 90 | Fill #0

## 2019-12-02 MED FILL — HYDROCHLOROTHIAZIDE 12.5 MG: 12.5 | 90 days supply | Qty: 90 | Fill #0

## 2019-12-05 ENCOUNTER — Telehealth: Payer: Self-pay | Admitting: Nurse Practitioner

## 2019-12-05 NOTE — Telephone Encounter (Signed)
Please advise.  Copied from [place] [id number]. Topic: General - Other >> Dec 03, 2019 10:02 AM Leward Quan A wrote: Reason for CRM: Patient called to ask Dr Raul Del nurse to please correct the No Show on her chart for appointment on 7/30/2. Per patient she was contacted by the nurse because her Physical was not due until October and the appointment was changed for that month so she is not sure why she have a no show in her record. She is asking for the change or to be contacted Ph# [phone]

## 2019-12-05 NOTE — Telephone Encounter (Signed)
CMA fixed it. Patient was reschedule and notified her appt. Was going to be cancel.  Spoke to patient and informed appt. Was fixed to cancel.

## 2019-12-17 MED FILL — CLOPIDOGREL 75 MG TABLET: 75 | 90 days supply | Qty: 90 | Fill #0

## 2019-12-17 MED FILL — HYDROCHLOROTHIAZIDE 12.5 MG: 12.5 | 90 days supply | Qty: 90 | Fill #0

## 2020-01-07 ENCOUNTER — Ambulatory Visit: Payer: BC Managed Care – PPO | Attending: Internal Medicine

## 2020-01-07 DIAGNOSIS — Z23 Encounter for immunization: Secondary | ICD-10-CM

## 2020-01-07 MED FILL — PFIZER-BIONTECH COVID-19 VA: 30 | 1 days supply | Qty: 0 | Fill #0

## 2020-01-07 NOTE — Progress Notes (Signed)
   Covid-19 Vaccination Clinic  Name:  Ruth Conley    MRN: [id number] DOB: 1965-12-28  01/07/2020  Ruth Conley was observed post Covid-19 immunization for 15 minutes without incident. She was provided with Vaccine Information Sheet and instruction to access the V-Safe system. Vaccine administered by Kennieth Rad, [place] student.  Ruth Conley was instructed to call 911 with any severe reactions post vaccine: Marland Kitchen Difficulty breathing  . Swelling of face and throat  . A fast heartbeat  . A bad rash all over body  . Dizziness and weakness   Immunizations Administered    Name Date Dose VIS Date Route   Pfizer COVID-19 Vaccine 01/07/2020 10:50 AM 0.3 mL 06/26/2018 Intramuscular   Manufacturer: [employer], [employer]   Lot: [id number]   [place]: [id number]

## 2020-01-28 ENCOUNTER — Ambulatory Visit: Payer: BC Managed Care – PPO | Attending: Internal Medicine

## 2020-01-28 DIAGNOSIS — Z23 Encounter for immunization: Secondary | ICD-10-CM

## 2020-01-28 NOTE — Progress Notes (Signed)
   Covid-19 Vaccination Clinic  Name:  Saloni Lablanc    MRN: [id number] DOB: 11-12-1965  01/28/2020  Ms. Egloff was observed post Covid-19 immunization for 15 minutes without incident. She was provided with Vaccine Information Sheet and instruction to access the V-Safe system.   Ms. Bahar was instructed to call 911 with any severe reactions post vaccine: Marland Kitchen Difficulty breathing  . Swelling of face and throat  . A fast heartbeat  . A bad rash all over body  . Dizziness and weakness   Immunizations Administered    Name Date Dose VIS Date Route   Pfizer COVID-19 Vaccine 01/28/2020  3:59 PM 0.3 mL 06/26/2018 Intramuscular   Manufacturer: [employer], [employer]   Lot: [id number]   [place]: [id number]

## 2020-01-31 ENCOUNTER — Other Ambulatory Visit (HOSPITAL_BASED_OUTPATIENT_CLINIC_OR_DEPARTMENT_OTHER): Payer: Self-pay | Admitting: Internal Medicine

## 2020-01-31 MED FILL — PFIZER-BIONTECH COVID-19 VA: 30 | 1 days supply | Qty: 0 | Fill #0

## 2020-02-12 ENCOUNTER — Encounter: Payer: BC Managed Care – PPO | Admitting: Nurse Practitioner

## 2020-02-17 MED FILL — ATORVASTATIN 10 MG TABLET: 10 | 90 days supply | Qty: 90 | Fill #0 | Status: TO

## 2020-03-18 ENCOUNTER — Encounter: Payer: BC Managed Care – PPO | Admitting: Nurse Practitioner

## 2020-03-19 ENCOUNTER — Other Ambulatory Visit: Payer: Self-pay | Admitting: Nurse Practitioner

## 2020-03-19 MED FILL — HYDROCHLOROTHIAZIDE 12.5 MG: 12.5 | 90 days supply | Qty: 90 | Fill #0

## 2020-03-19 MED FILL — CLOPIDOGREL 75 MG TABLET: 75 | 90 days supply | Qty: 90 | Fill #1

## 2020-04-21 ENCOUNTER — Encounter: Payer: BC Managed Care – PPO | Admitting: Nurse Practitioner

## 2020-05-25 ENCOUNTER — Other Ambulatory Visit: Payer: Self-pay

## 2020-05-25 ENCOUNTER — Encounter: Payer: Self-pay | Admitting: Nurse Practitioner

## 2020-05-25 ENCOUNTER — Other Ambulatory Visit: Payer: Self-pay | Admitting: Nurse Practitioner

## 2020-05-25 ENCOUNTER — Ambulatory Visit: Payer: BC Managed Care – PPO | Attending: Nurse Practitioner | Admitting: Nurse Practitioner

## 2020-05-25 VITALS — BP 112/66 | HR 92 | Temp 99.1°F | Ht 67.0 in | Wt 216.0 lb

## 2020-05-25 DIAGNOSIS — I10 Essential (primary) hypertension: Secondary | ICD-10-CM

## 2020-05-25 DIAGNOSIS — E782 Mixed hyperlipidemia: Secondary | ICD-10-CM

## 2020-05-25 DIAGNOSIS — I63531 Cerebral infarction due to unspecified occlusion or stenosis of right posterior cerebral artery: Secondary | ICD-10-CM

## 2020-05-25 DIAGNOSIS — D500 Iron deficiency anemia secondary to blood loss (chronic): Secondary | ICD-10-CM

## 2020-05-25 DIAGNOSIS — Z1231 Encounter for screening mammogram for malignant neoplasm of breast: Secondary | ICD-10-CM

## 2020-05-25 DIAGNOSIS — Z8639 Personal history of other endocrine, nutritional and metabolic disease: Secondary | ICD-10-CM

## 2020-05-25 DIAGNOSIS — Z0000 Encounter for general adult medical examination without abnormal findings: Secondary | ICD-10-CM | POA: Diagnosis not present

## 2020-05-25 DIAGNOSIS — Z862 Personal history of diseases of the blood and blood-forming organs and certain disorders involving the immune mechanism: Secondary | ICD-10-CM | POA: Diagnosis not present

## 2020-05-25 DIAGNOSIS — R000 Tachycardia, unspecified: Secondary | ICD-10-CM

## 2020-05-25 DIAGNOSIS — Z1211 Encounter for screening for malignant neoplasm of colon: Secondary | ICD-10-CM

## 2020-05-25 MED ORDER — FERROUS SULFATE 325 (65 FE) MG PO TABS: ORAL_TABLET | ORAL | 2 refills | Status: DC

## 2020-05-25 MED ORDER — METOPROLOL SUCCINATE ER 50 MG PO TB24
ORAL_TABLET | ORAL | 0 refills | Status: DC
Start: 2020-05-25 — End: 2020-05-25

## 2020-05-25 MED ORDER — CLOPIDOGREL BISULFATE 75 MG PO TABS: 75.0000 mg | ORAL_TABLET | Freq: Every day | ORAL | 1 refills | Status: DC

## 2020-05-25 MED ORDER — HYDROCHLOROTHIAZIDE 12.5 MG PO CAPS: ORAL_CAPSULE | ORAL | 0 refills | Status: DC

## 2020-05-25 MED ORDER — ATORVASTATIN CALCIUM 10 MG PO TABS: 10.0000 mg | ORAL_TABLET | Freq: Every day | ORAL | 2 refills | Status: DC

## 2020-05-25 MED FILL — ATORVASTATIN 10 MG TABLET: 10 | 90 days supply | Qty: 90 | Fill #0

## 2020-05-25 NOTE — Progress Notes (Signed)
Assessment & Plan:  Ruth Conley was seen today for annual exam.  Diagnoses and all orders for this visit:  Annual physical exam  Essential hypertension -     CMP14+EGFR -     hydrochlorothiazide (MICROZIDE) 12.5 MG capsule; TAKE 1 TABLET (12.5 MG TOTAL) BY MOUTH DAILY. -     metoprolol succinate (TOPROL-XL) 50 MG 24 hr tablet; TAKE 1 TABLET BY MOUTH ONCE DAILY WITH  OR  IMMEDIATELY  FOLLOWING  A  MEAL Continue all antihypertensives as prescribed.  Remember to bring in your blood pressure log with you for your follow up appointment.  DASH/Mediterranean Diets are healthier choices for HTN.    Cerebrovascular accident (CVA) due to occlusion of right posterior cerebral artery (HCC) -     atorvastatin (LIPITOR) 10 MG tablet; Take 1 tablet (10 mg total) by mouth daily. -     clopidogrel (PLAVIX) 75 MG tablet; Take 1 tablet (75 mg total) by mouth daily.  Iron deficiency anemia due to chronic blood loss -     CBC -     ferrous sulfate (FEROSUL) 325 (65 FE) MG tablet; TAKE 1 TABLET (325 MG TOTAL) BY MOUTH EVERY OTHER DAY.  Breast cancer screening by mammogram -     MM 3D SCREEN BREAST BILATERAL; Future  Colon cancer screening -     Fecal occult blood, imunochemical([facility])  History of elevated glucose -     Hemoglobin A1c  Mixed hyperlipidemia -     Lipid panel -     atorvastatin (LIPITOR) 10 MG tablet; Take 1 tablet (10 mg total) by mouth daily. INSTRUCTIONS: Work on a low fat, heart healthy diet and participate in regular aerobic exercise program by working out at least 150 minutes per week; 5 days a week-30 minutes per day. Avoid red meat/beef/steak,  fried foods. junk foods, sodas, sugary drinks, unhealthy snacking, alcohol and smoking.  Drink at least 80 oz of water per day and monitor your carbohydrate intake daily.    Patient has been counseled on age-appropriate routine health concerns for screening and prevention. These are reviewed and up-to-date. Referrals have  been placed accordingly. Immunizations are up-to-date or declined.    Subjective:   Chief Complaint  Patient presents with  . Annual Exam    Patient is here for a physical.    HPI Ruth Conley 55 y.o. female presents to office today for physical exam.  She has a PMH of carotid stenosis, TIA, Stroke, HTN Doing well today. Wants to work on losing weight.    Essential Hypertension Well controlled. Taking HCTZ 12.5 mg daily and toprol XL 50 mg Daily (tachycardia) as prescribed. Denies chest pain, shortness of breath, palpitations, lightheadedness, dizziness, headaches or BLE edema.  BP Readings from Last 3 Encounters:  05/25/20 112/66  05/27/19 115/79  02/08/19 116/83       Review of Systems  Constitutional: Negative.  Negative for chills, fever, malaise/fatigue and weight loss.  Respiratory: Negative.  Negative for cough, sputum production, shortness of breath and wheezing.   Cardiovascular: Negative.  Negative for chest pain and leg swelling.  Gastrointestinal: Negative.  Negative for abdominal pain, blood in stool, constipation, diarrhea, heartburn, melena, nausea and vomiting.  Genitourinary: Negative.   Skin: Negative.  Negative for rash.  Neurological: Negative.  Negative for dizziness, tremors, speech change, focal weakness, seizures and headaches.  Psychiatric/Behavioral: Negative.  Negative for depression and suicidal ideas. The patient is not nervous/anxious and does not have insomnia.     Past Medical History:  Diagnosis Date  . Carotid stenosis    a. 08/2014: dopplers showing 40-59% RICA stenosis and 1-61% LICA stenosis  . Hypertension   . Stroke ([place])   . TIA (transient ischemic attack) 2016    Past Surgical History:  Procedure Laterality Date  . CESAREAN SECTION    . TRANSTHORACIC ECHOCARDIOGRAM  04/2015   (no change from 08/2014) Normal LV size and thickness. EF 60-65%. GR 1 DD. No wall motion abnormality. No source of embolism noted    Family  History  Problem Relation Age of Onset  . Hypertension Mother   . CAD Father 40  . Stroke Paternal Uncle     Social History Reviewed with no changes to be made today.   Outpatient Medications Prior to Visit  Medication Sig Dispense Refill  . Cholecalciferol (VITAMIN D3 PO) Take by mouth.    . Coenzyme Q-10 100 MG capsule Take 100 mg by mouth daily. Reported on 11/10/2015    . lactobacillus acidophilus (BACID) TABS tablet Take 2 tablets by mouth 3 (three) times daily.    . Multiple Vitamins-Minerals (MULTIVITAMIN WITH MINERALS) tablet Take 1 tablet by mouth daily.    . Omega 3 1200 MG CAPS Take 600 mg by mouth daily.    . vitamin B-12 (CYANOCOBALAMIN) 100 MCG tablet Take 100 mcg by mouth daily.    Marland Kitchen atorvastatin (LIPITOR) 10 MG tablet TAKE 1 TABLET BY MOUTH DAILY AT 6 PM. 90 tablet 2  . clopidogrel (PLAVIX) 75 MG tablet Take 1 tablet (75 mg total) by mouth daily. 90 tablet 1  . FEROSUL 325 (65 Fe) MG tablet TAKE 1 TABLET (325 MG TOTAL) BY MOUTH EVERY OTHER DAY. 90 tablet 2  . hydrochlorothiazide (MICROZIDE) 12.5 MG capsule TAKE 1 TABLET (12.5 MG TOTAL) BY MOUTH DAILY. 90 capsule 0  . metoprolol succinate (TOPROL-XL) 50 MG 24 hr tablet TAKE 1 TABLET BY MOUTH ONCE DAILY WITH  OR  IMMEDIATELY  FOLLOWING  A  MEAL 90 tablet 0  . metoprolol succinate (TOPROL-XL) 50 MG 24 hr tablet Take 1 tablet (50 mg total) by mouth daily. Take with or immediately following a meal. 90 tablet 1   No facility-administered medications prior to visit.    Allergies  Allergen Reactions  . Lisinopril Cough       Objective:    BP 112/66 (BP Location: Left Arm, Patient Position: Sitting, Cuff Size: Large)   Pulse 92   Temp 99.1 F (37.3 C) (Oral)   Ht '5\' 7"'  (1.702 m)   Wt 216 lb (98 kg)   LMP 05/01/2020   SpO2 98%   BMI 33.83 kg/m  Wt Readings from Last 3 Encounters:  05/25/20 216 lb (98 kg)  05/27/19 210 lb (95.3 kg)  02/08/19 213 lb 12.8 oz (97 kg)    Physical Exam Constitutional:       Appearance: She is well-developed and well-nourished.  HENT:     Head: Normocephalic and atraumatic.     Right Ear: Hearing, tympanic membrane, ear canal and external ear normal.     Left Ear: Hearing, tympanic membrane, ear canal and external ear normal.     Nose: Nose normal.     Mouth/Throat:     Mouth: Oropharynx is clear and moist.     Pharynx: No oropharyngeal exudate.  Eyes:     General: No scleral icterus.       Right eye: No discharge.     Extraocular Movements: Extraocular movements intact and EOM normal.  Conjunctiva/sclera: Conjunctivae normal.     Pupils: Pupils are equal, round, and reactive to light.  Neck:     Thyroid: No thyroid mass, thyromegaly or thyroid tenderness.     Trachea: No tracheal deviation.  Cardiovascular:     Rate and Rhythm: Normal rate and regular rhythm.     Pulses: Intact distal pulses.     Heart sounds: Normal heart sounds. No murmur heard. No friction rub.  Pulmonary:     Effort: Pulmonary effort is normal. No accessory muscle usage or respiratory distress.     Breath sounds: Normal breath sounds. No decreased breath sounds, wheezing, rhonchi or rales.  Chest:     Chest wall: No tenderness.  Breasts: Breasts are symmetrical.     Right: No inverted nipple, mass, nipple discharge, skin change or tenderness.     Left: No inverted nipple, mass, nipple discharge, skin change or tenderness.    Abdominal:     General: Bowel sounds are normal. There is no distension.     Palpations: Abdomen is soft. There is no mass.     Tenderness: There is no abdominal tenderness. There is no guarding or rebound.  Musculoskeletal:        General: No tenderness, deformity or edema. Normal range of motion.     Cervical back: Normal range of motion and neck supple.  Lymphadenopathy:     Cervical: No cervical adenopathy.  Skin:    General: Skin is warm and dry.     Findings: No erythema.  Neurological:     Mental Status: She is alert and oriented to  person, place, and time.     Cranial Nerves: No cranial nerve deficit.     Coordination: Coordination normal.     Deep Tendon Reflexes: Reflexes are normal and symmetric.  Psychiatric:        Mood and Affect: Mood and affect normal.        Speech: Speech normal.        Behavior: Behavior normal.        Thought Content: Thought content normal.        Judgment: Judgment normal.          Patient has been counseled extensively about nutrition and exercise as well as the importance of adherence with medications and regular follow-up. The patient was given clear instructions to go to ER or return to medical center if symptoms don't improve, worsen or new problems develop. The patient verbalized understanding.   Follow-up: Return in about 6 months (around 11/22/2020).   Gildardo Pounds, FNP-BC [facility] [place] [place], [place]   05/25/2020, 5:11 PM

## 2020-05-26 LAB — LIPID PANEL
Chol/HDL Ratio: 2.4 ratio (ref 0.0–4.4)
Cholesterol, Total: 197 mg/dL (ref 100–199)
HDL: 82 mg/dL (ref >39)
LDL Chol Calc (NIH): 100 mg/dL — ABNORMAL HIGH (ref 0–99)
Triglycerides: 82 mg/dL (ref 0–149)
VLDL Cholesterol Cal: 15 mg/dL (ref 5–40)

## 2020-05-26 LAB — CMP14+EGFR
ALT: 16 IU/L (ref 0–32)
AST: 18 IU/L (ref 0–40)
Albumin/Globulin Ratio: 1.4 (ref 1.2–2.2)
Albumin: 4.1 g/dL (ref 3.8–4.9)
Alkaline Phosphatase: 89 IU/L (ref 44–121)
BUN/Creatinine Ratio: 11 (ref 9–23)
BUN: 8 mg/dL (ref 6–24)
Bilirubin Total: <0.2 mg/dL (ref 0.0–1.2)
CO2: 26 mmol/L (ref 20–29)
Calcium: 9.1 mg/dL (ref 8.7–10.2)
Chloride: 101 mmol/L (ref 96–106)
Creatinine, Ser: 0.76 mg/dL (ref 0.57–1.00)
GFR calc Af Amer: 104 mL/min/{1.73_m2} (ref >59)
GFR calc non Af Amer: 90 mL/min/{1.73_m2} (ref >59)
Globulin, Total: 2.9 g/dL (ref 1.5–4.5)
Glucose: 151 mg/dL — ABNORMAL HIGH (ref 65–99)
Potassium: 3.6 mmol/L (ref 3.5–5.2)
Sodium: 140 mmol/L (ref 134–144)
Total Protein: 7 g/dL (ref 6.0–8.5)

## 2020-05-26 LAB — CBC
Hematocrit: 31 % — ABNORMAL LOW (ref 34.0–46.6)
Hemoglobin: 9.6 g/dL — ABNORMAL LOW (ref 11.1–15.9)
MCH: 23.5 pg — ABNORMAL LOW (ref 26.6–33.0)
MCHC: 31 g/dL — ABNORMAL LOW (ref 31.5–35.7)
MCV: 76 fL — ABNORMAL LOW (ref 79–97)
Platelets: 398 10*3/uL (ref 150–450)
RBC: 4.08 x10E6/uL (ref 3.77–5.28)
RDW: 16 % — ABNORMAL HIGH (ref 11.7–15.4)
WBC: 6.2 10*3/uL (ref 3.4–10.8)

## 2020-05-26 LAB — HEMOGLOBIN A1C
Est. average glucose Bld gHb Est-mCnc: 117 mg/dL
Hgb A1c MFr Bld: 5.7 % — ABNORMAL HIGH (ref 4.8–5.6)

## 2020-06-04 ENCOUNTER — Encounter: Payer: Self-pay | Admitting: Nurse Practitioner

## 2020-06-19 MED FILL — CLOPIDOGREL 75 MG TABLET: 75 | 90 days supply | Qty: 90 | Fill #0

## 2020-06-19 MED FILL — METOPROLOL SUCCINATE ER 50: 50 | 90 days supply | Qty: 90 | Fill #0

## 2020-06-19 MED FILL — HYDROCHLOROTHIAZIDE 12.5 MG: 12.5 | 90 days supply | Qty: 90 | Fill #0

## 2020-09-18 ENCOUNTER — Other Ambulatory Visit: Payer: Self-pay | Admitting: Nurse Practitioner

## 2020-09-18 DIAGNOSIS — E782 Mixed hyperlipidemia: Secondary | ICD-10-CM

## 2020-09-18 DIAGNOSIS — I63531 Cerebral infarction due to unspecified occlusion or stenosis of right posterior cerebral artery: Secondary | ICD-10-CM

## 2020-09-18 DIAGNOSIS — I10 Essential (primary) hypertension: Secondary | ICD-10-CM

## 2020-09-18 MED ORDER — CLOPIDOGREL BISULFATE 75 MG PO TABS
ORAL_TABLET | Freq: Every day | ORAL | 0 refills | Status: DC
  Filled 2020-09-18: qty 90, 90d supply, fill #0

## 2020-09-18 MED ORDER — METOPROLOL SUCCINATE ER 50 MG PO TB24
ORAL_TABLET | ORAL | 0 refills | Status: DC
  Filled 2020-09-18: qty 90, 90d supply, fill #0

## 2020-09-18 MED ORDER — ATORVASTATIN CALCIUM 10 MG PO TABS
ORAL_TABLET | Freq: Every day | ORAL | 2 refills | Status: DC
  Filled 2020-09-18: qty 90, 90d supply, fill #0
  Filled 2021-01-14: qty 90, 90d supply, fill #1

## 2020-09-18 MED ORDER — HYDROCHLOROTHIAZIDE 12.5 MG PO CAPS
ORAL_CAPSULE | Freq: Every day | ORAL | 0 refills | Status: DC
Start: 2020-09-18 — End: 2020-12-28
  Filled 2020-09-18: qty 90, 90d supply, fill #0

## 2020-09-18 NOTE — Telephone Encounter (Signed)
Copied from [place] [phone]. Topic: Quick Communication - Rx Refill/Question >> Sep 18, 2020  4:15 PM Tessa Lerner A wrote: Medication: Rx #: [id number]  clopidogrel (PLAVIX) 75 MG tablet   Rx #: [id number]  atorvastatin (LIPITOR) 10 MG tablet   Rx #: [id number]  hydrochlorothiazide (MICROZIDE) 12.5 MG capsule   Rx #: [id number]  metoprolol succinate (TOPROL-XL) 50 MG 24 hr tablet   Has the patient contacted their pharmacy? Patient has attempted to contact their phone and been unsuccessful   Preferred Pharmacy (with phone number or street name): [facility] [place]  Phone:  [phone] Fax:  [phone]  Agent: Please be advised that RX refills may take up to 3 business days. We ask that you follow-up with your pharmacy.

## 2020-09-18 NOTE — Telephone Encounter (Signed)
Requested Prescriptions  Pending Prescriptions Disp Refills  . clopidogrel (PLAVIX) 75 MG tablet 90 tablet 0    Sig: TAKE 1 TABLET (75 MG TOTAL) BY MOUTH DAILY.     Hematology: Antiplatelets - clopidogrel Failed - 09/18/2020  5:33 PM      Failed - Evaluate AST, ALT within 2 months of therapy initiation.      Failed - HCT in normal range and within 180 days    Hematocrit  Date Value Ref Range Status  05/25/2020 31.0 (L) 34.0 - 46.6 % Final         Failed - HGB in normal range and within 180 days    Hemoglobin  Date Value Ref Range Status  05/25/2020 9.6 (L) 11.1 - 15.9 g/dL Final         Passed - ALT in normal range and within 360 days    ALT  Date Value Ref Range Status  05/25/2020 16 0 - 32 IU/L Final         Passed - AST in normal range and within 360 days    AST  Date Value Ref Range Status  05/25/2020 18 0 - 40 IU/L Final         Passed - PLT in normal range and within 180 days    Platelets  Date Value Ref Range Status  05/25/2020 398 150 - 450 x10E3/uL Final         Passed - Valid encounter within last 6 months    Recent Outpatient Visits          3 months ago Annual physical exam   [place], Vernia Buff, NP   1 year ago Encounter for annual physical exam   [place], [place] W, NP   1 year ago Cerebrovascular accident (CVA) due to occlusion of right posterior cerebral artery [facility])   [place], Vernia Buff, NP   2 years ago Well woman exam without gynecological exam   [place], [place] W, NP   3 years ago Essential hypertension   [place], [place] W, NP             . hydrochlorothiazide (MICROZIDE) 12.5 MG capsule 90 capsule 0    Sig: TAKE 1 TABLET (12.5 MG TOTAL) BY MOUTH DAILY.     Cardiovascular: Diuretics - Thiazide Passed - 09/18/2020  5:33 PM      Passed - Ca  in normal range and within 360 days    Calcium  Date Value Ref Range Status  05/25/2020 9.1 8.7 - 10.2 mg/dL Final   Calcium, Ion  Date Value Ref Range Status  10/20/2007 1.15  Final         Passed - Cr in normal range and within 360 days    Creat  Date Value Ref Range Status  11/10/2015 0.92 0.50 - 1.10 mg/dL Final   Creatinine, Ser  Date Value Ref Range Status  05/25/2020 0.76 0.57 - 1.00 mg/dL Final         Passed - K in normal range and within 360 days    Potassium  Date Value Ref Range Status  05/25/2020 3.6 3.5 - 5.2 mmol/L Final         Passed - Na in normal range and within 360 days    Sodium  Date Value Ref Range Status  05/25/2020 140 134 -  144 mmol/L Final         Passed - Last BP in normal range    BP Readings from Last 1 Encounters:  05/25/20 112/66         Passed - Valid encounter within last 6 months    Recent Outpatient Visits          3 months ago Annual physical exam   [place], Vernia Buff, NP   1 year ago Encounter for annual physical exam   [place], [place] W, NP   1 year ago Cerebrovascular accident (CVA) due to occlusion of right posterior cerebral artery [facility])   [place], Vernia Buff, NP   2 years ago Well woman exam without gynecological exam   [place], [place] W, NP   3 years ago Essential hypertension   [place], [place] W, NP             . metoprolol succinate (TOPROL-XL) 50 MG 24 hr tablet 90 tablet 0    Sig: TAKE 1 TABLET BY MOUTH ONCE DAILY WITH OR IMMEDIATELY FOLLOWING A MEAL     Cardiovascular:  Beta Blockers Passed - 09/18/2020  5:33 PM      Passed - Last BP in normal range    BP Readings from Last 1 Encounters:  05/25/20 112/66         Passed - Last Heart Rate in normal range    Pulse Readings from Last 1 Encounters:  05/25/20  92         Passed - Valid encounter within last 6 months    Recent Outpatient Visits          3 months ago Annual physical exam   [place], Vernia Buff, NP   1 year ago Encounter for annual physical exam   [place], [place] W, NP   1 year ago Cerebrovascular accident (CVA) due to occlusion of right posterior cerebral artery [facility])   [place], Vernia Buff, NP   2 years ago Well woman exam without gynecological exam   [place], Vernia Buff, NP   3 years ago Essential hypertension   [place], NP             . atorvastatin (LIPITOR) 10 MG tablet 90 tablet 2    Sig: TAKE 1 TABLET (10 MG TOTAL) BY MOUTH DAILY.     Cardiovascular:  Antilipid - Statins Failed - 09/18/2020  5:33 PM      Failed - LDL in normal range and within 360 days    LDL Chol Calc (NIH)  Date Value Ref Range Status  05/25/2020 100 (H) 0 - 99 mg/dL Final         Passed - Total Cholesterol in normal range and within 360 days    Cholesterol, Total  Date Value Ref Range Status  05/25/2020 197 100 - 199 mg/dL Final         Passed - HDL in normal range and within 360 days    HDL  Date Value Ref Range Status  05/25/2020 82 >39 mg/dL Final         Passed - Triglycerides in normal range and within 360 days  Triglycerides  Date Value Ref Range Status  05/25/2020 82 0 - 149 mg/dL Final         Passed - Patient is not pregnant      Passed - Valid encounter within last 12 months    Recent Outpatient Visits          3 months ago Annual physical exam   [place], Vernia Buff, NP   1 year ago Encounter for annual physical exam   [place], [place] W, NP   1 year ago Cerebrovascular accident (CVA) due to occlusion of right posterior cerebral  artery [facility])   [place] Gildardo Pounds, NP   2 years ago Well woman exam without gynecological exam   [place], [place] W, NP   3 years ago Essential hypertension   [place], Vernia Buff, NP

## 2020-09-21 ENCOUNTER — Other Ambulatory Visit: Payer: Self-pay

## 2020-11-17 ENCOUNTER — Encounter: Payer: Self-pay | Admitting: Nurse Practitioner

## 2020-11-29 IMAGING — US US PELVIS COMPLETE TRANSABD/TRANSVAG
1 series · 15 of 25 positions shown · non-contrast
Comparison: 05/01/2015

CLINICAL DATA: Fibroids



[Series 1: us pelvis complete transabd/transvag · 15 of 39 slices shown]
[im 1/39]
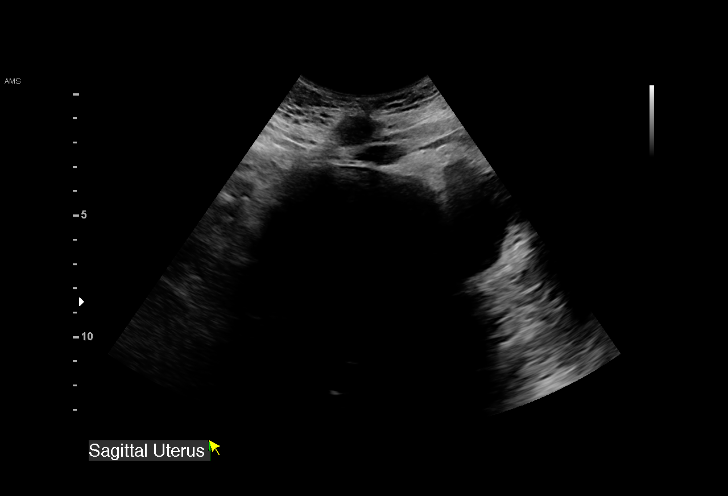
[im 4/39]
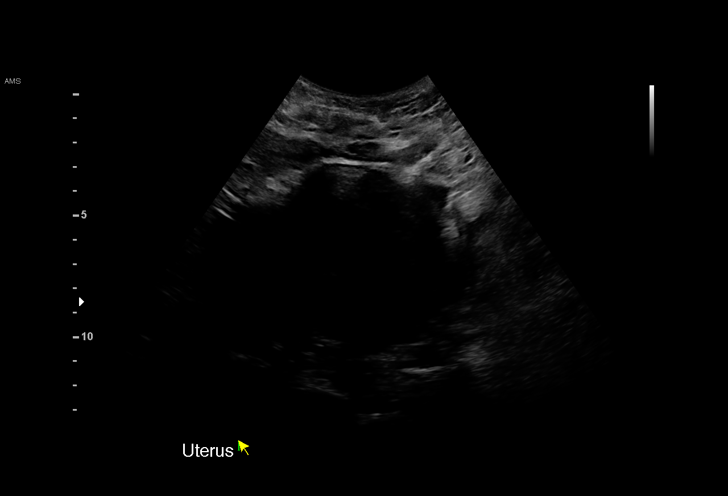
[im 7/39]
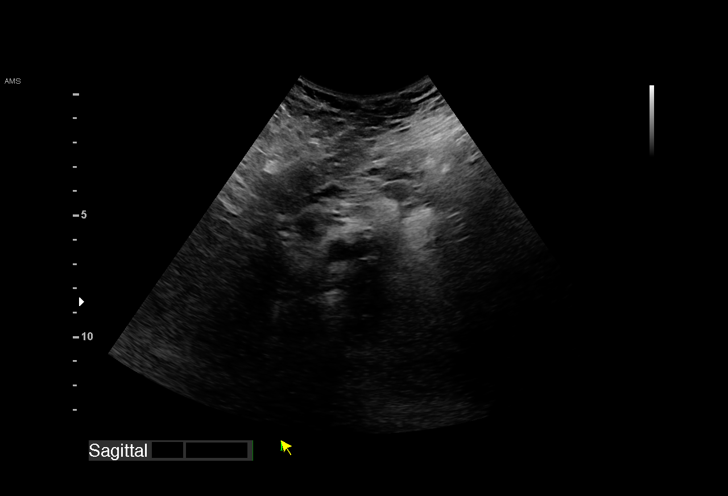
[im 8/39]
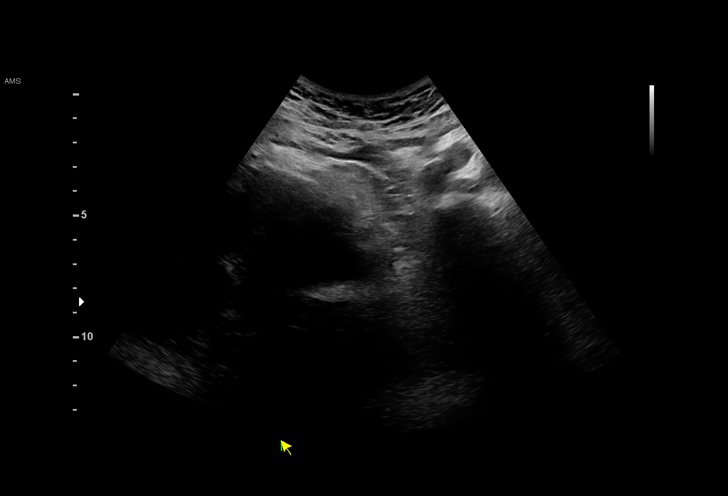
[im 12/39]
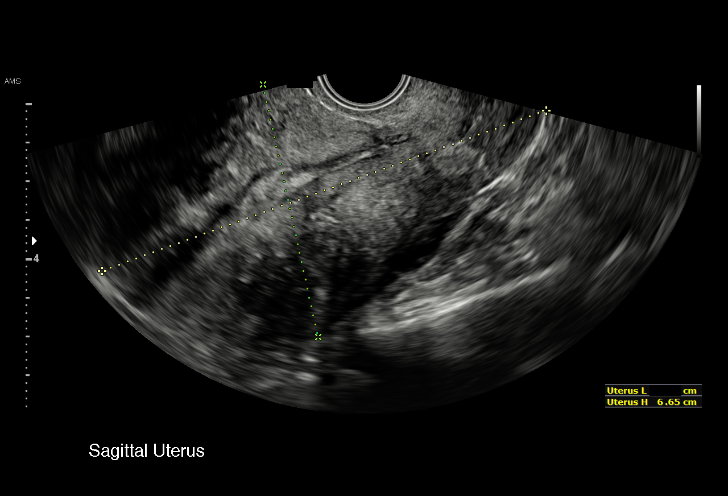
[im 15/39]
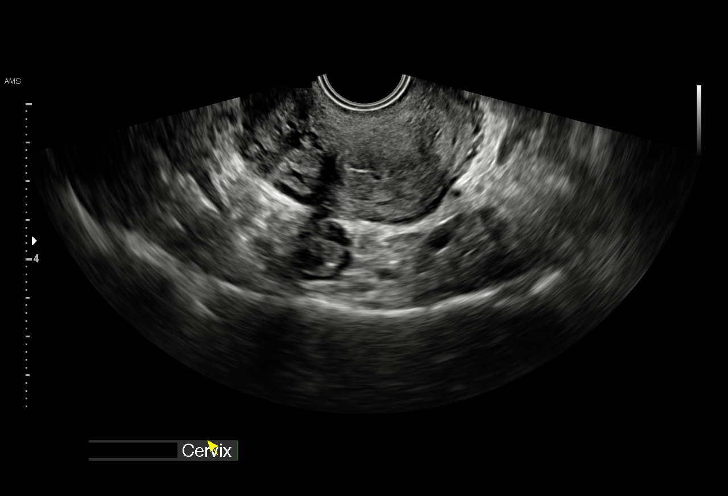
[im 16/39]
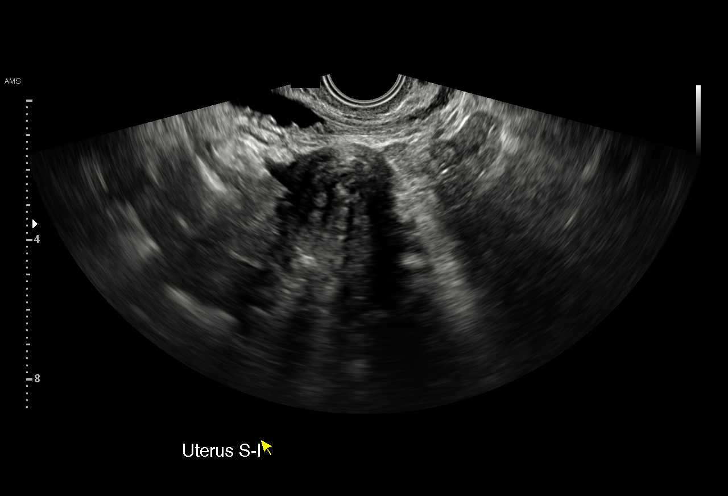
[im 20/39]
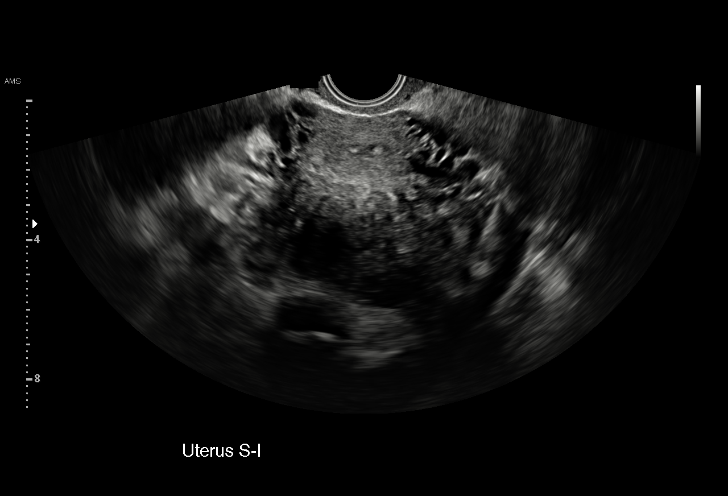
[im 23/39]
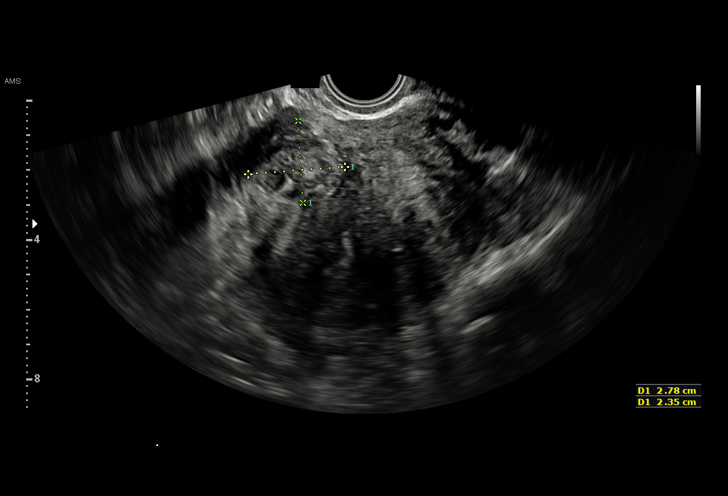
[im 24/39]
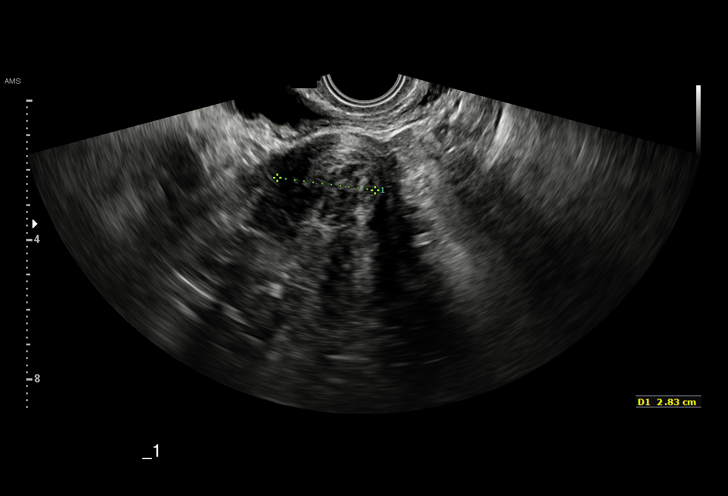
[im 27/39]
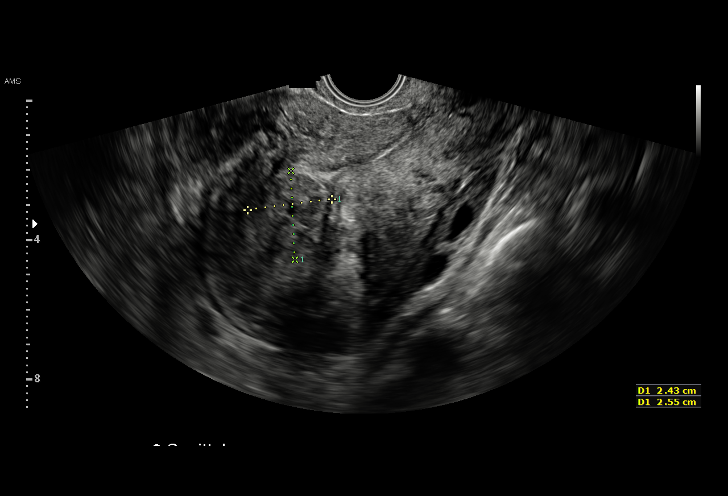
[im 31/39]
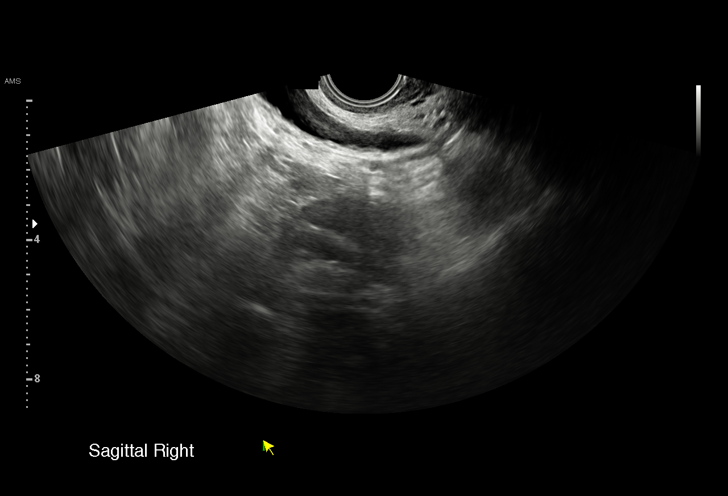
[im 32/39]
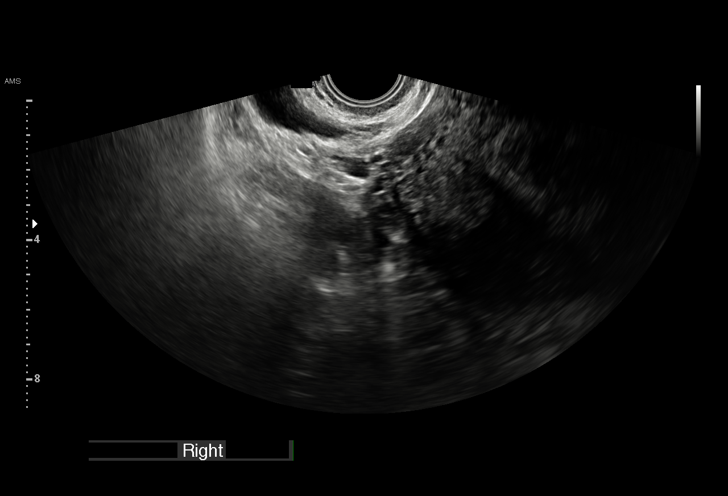
[im 35/39]
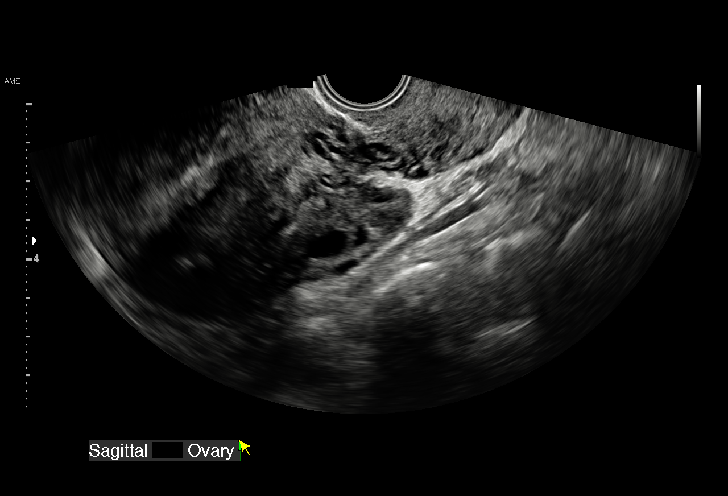
[im 39/39]
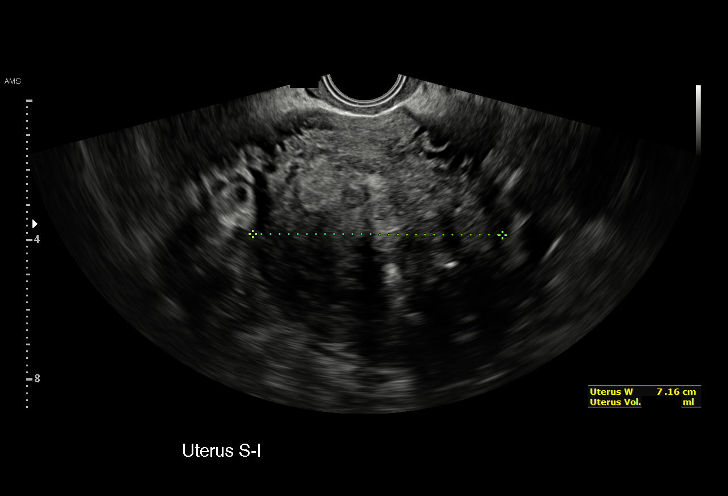

[15 of 25 positions shown; findings below may reference images not displayed]

FINDINGS: Uterus

Measurements: 12.2 x 6.7 x 7.2 cm = volume: 303 mL. Multiple (at
least 4) fibroids throughout the body and fundus of the uterus, the
largest 2.8 cm in the right anterior fundus. This likely corresponds
to the previously seen 3.3 cm fibroid.

Endometrium

Thickness: Not visualized due to multiple fibroids..

Right ovary

Measurements: Not visualized.  No adnexal mass seen.

Left ovary

Measurements: 3.8 x 1.6 x 5.6 cm = volume: 11.3 mL. Normal
appearance/no adnexal mass.

Other findings

No abnormal free fluid.
IMPRESSION: Enlarged uterus with multiple fibroids, not significantly changed
since prior study.

## 2020-12-08 IMAGING — MG DIGITAL SCREENING BILATERAL MAMMOGRAM WITH CAD
4 series · 4 of 4 positions shown · non-contrast
Comparison: Previous exam(s).

CLINICAL DATA: Screening.

EXAM:
DIGITAL SCREENING BILATERAL MAMMOGRAM WITH CAD

[L MLO]
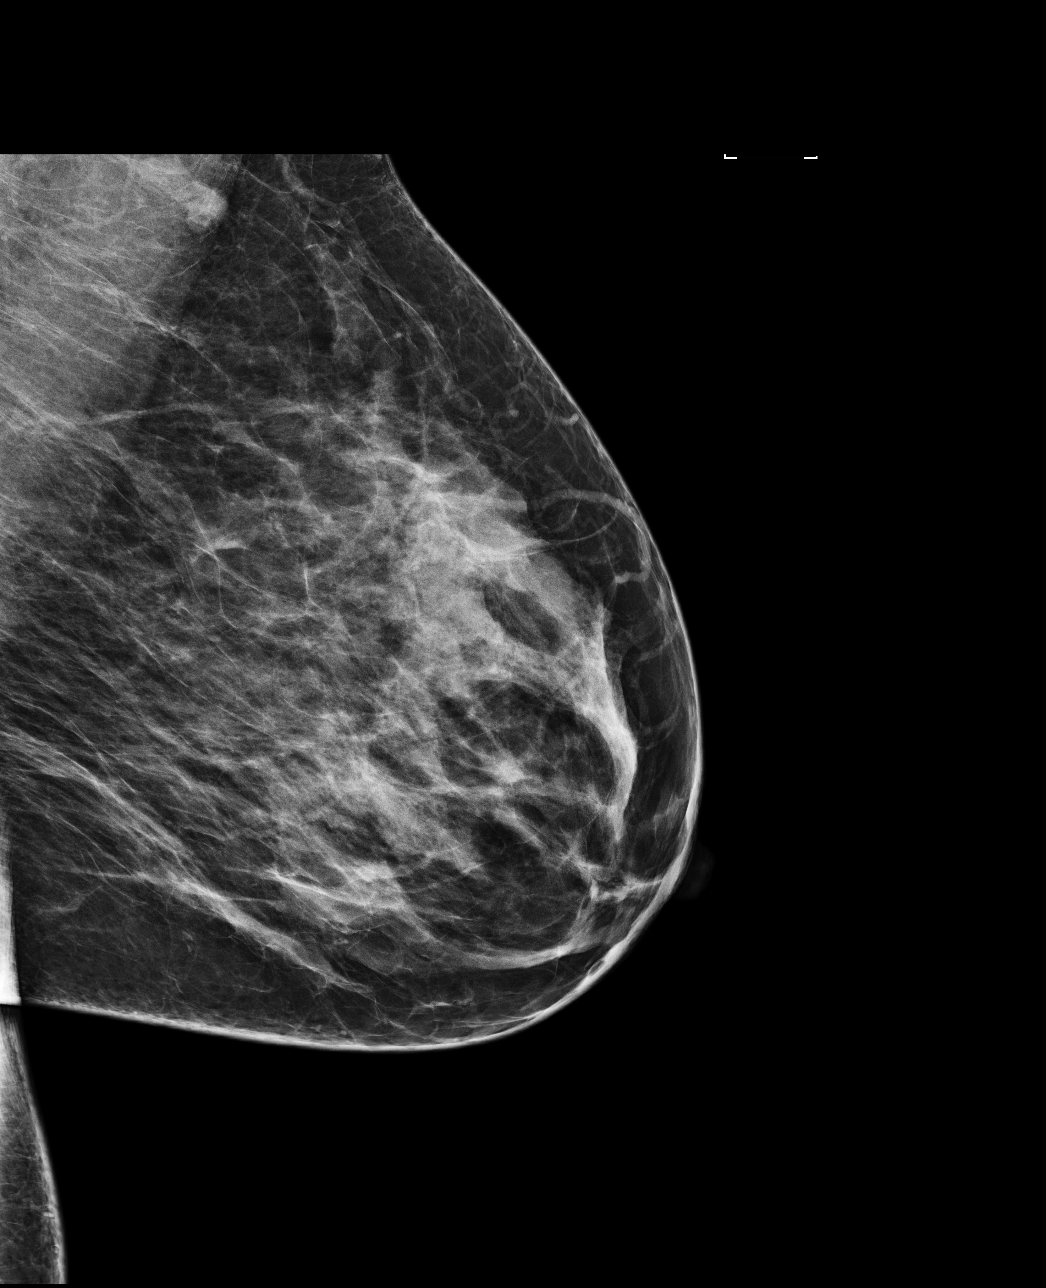

[R CC]
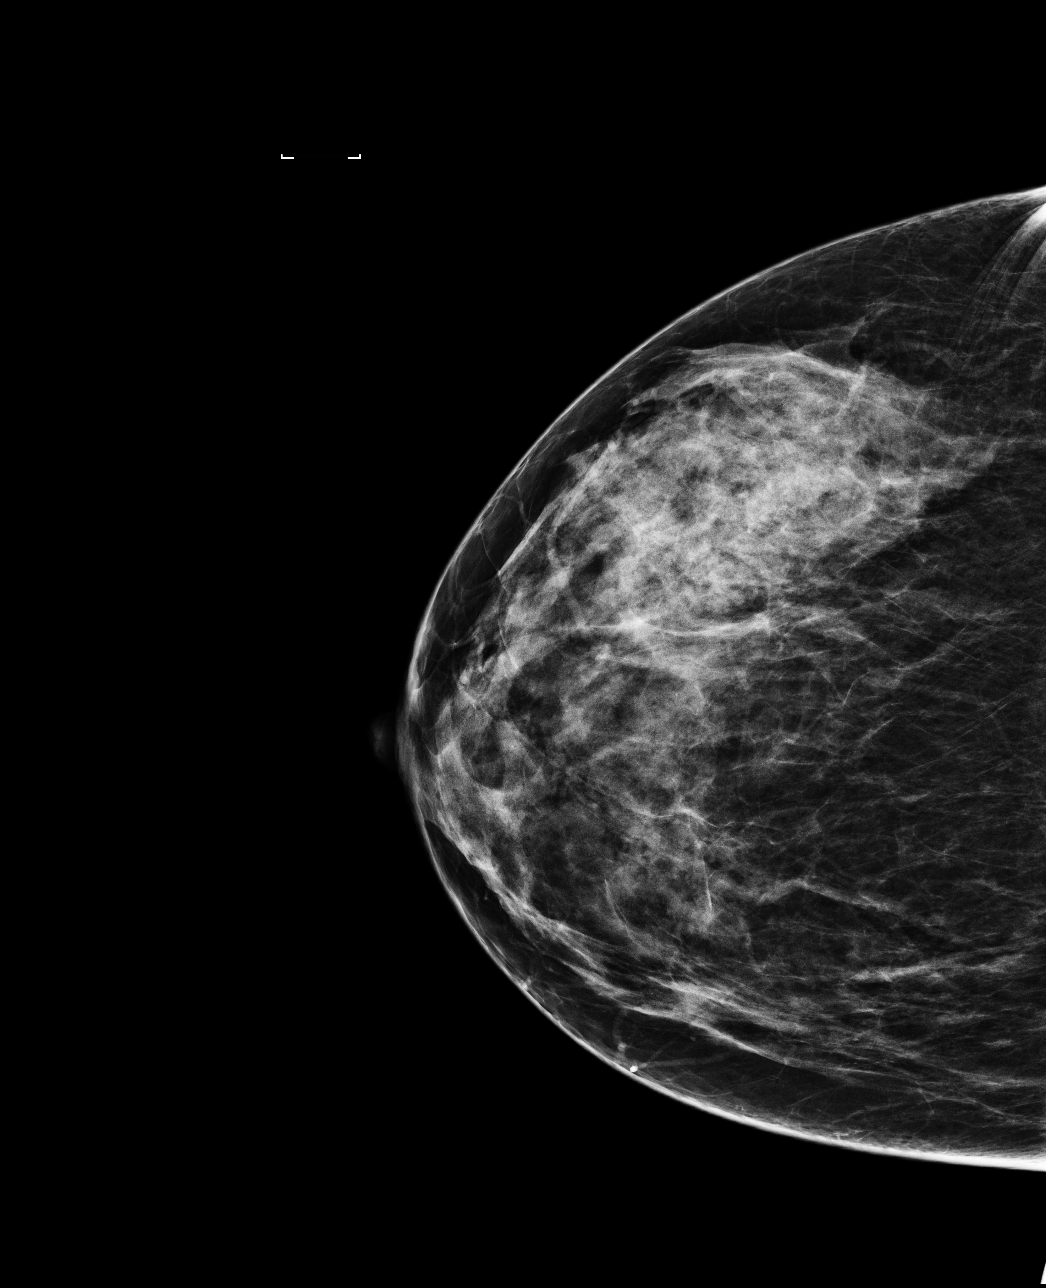

[L CC]
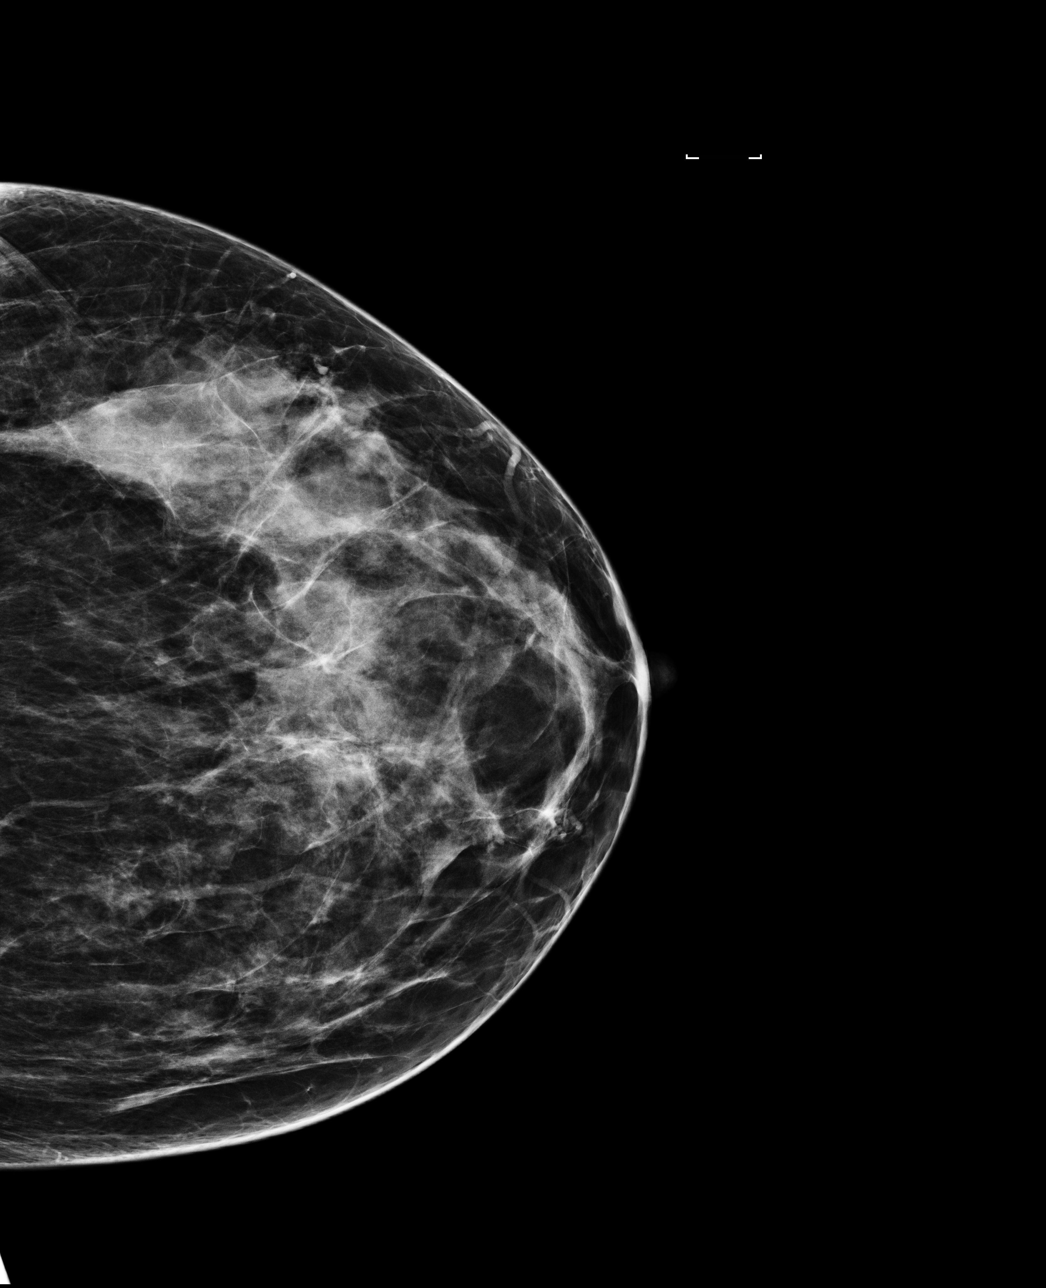

[R MLO]
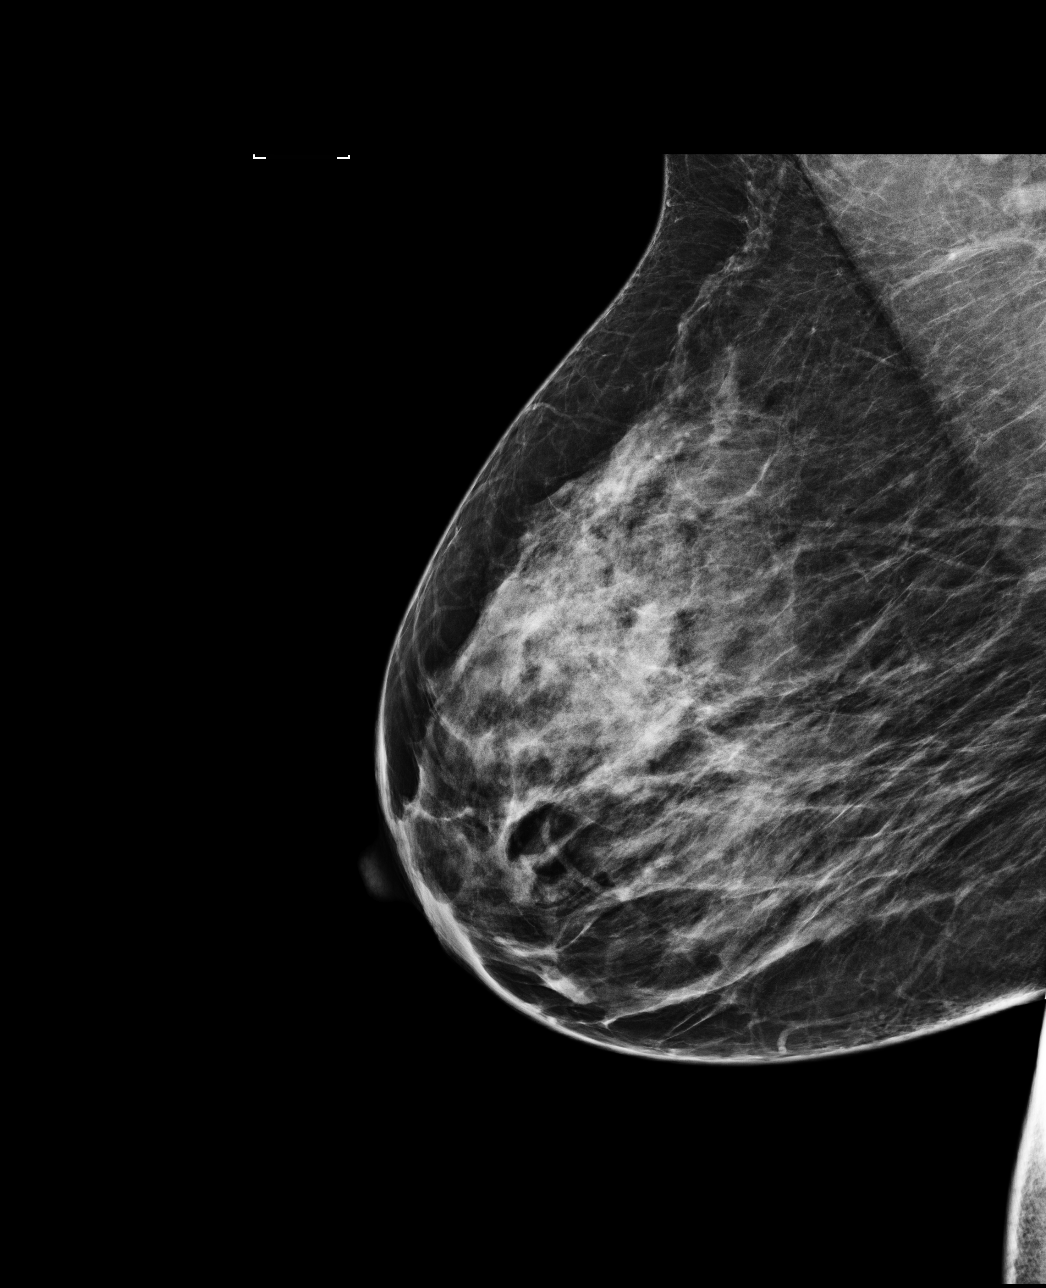

[4 of 4 positions shown; findings below may reference images not displayed]

ACR Breast Density Category c: The breast tissue is heterogeneously
dense, which may obscure small masses.
FINDINGS: There are no findings suspicious for malignancy. Images were
processed with CAD.
IMPRESSION: No mammographic evidence of malignancy. A result letter of this
screening mammogram will be mailed directly to the patient.

RECOMMENDATION:
Screening mammogram in one year. (Code:[id number])

BI-RADS CATEGORY  1: Negative.

## 2020-12-11 ENCOUNTER — Other Ambulatory Visit: Payer: Self-pay

## 2020-12-11 ENCOUNTER — Other Ambulatory Visit: Payer: Self-pay | Admitting: Nurse Practitioner

## 2020-12-11 DIAGNOSIS — I10 Essential (primary) hypertension: Secondary | ICD-10-CM

## 2020-12-11 DIAGNOSIS — I63531 Cerebral infarction due to unspecified occlusion or stenosis of right posterior cerebral artery: Secondary | ICD-10-CM

## 2020-12-11 MED ORDER — CLOPIDOGREL BISULFATE 75 MG PO TABS: ORAL_TABLET | Freq: Every day | ORAL | 3 refills | Status: AC

## 2020-12-11 MED ORDER — METOPROLOL SUCCINATE ER 50 MG PO TB24
ORAL_TABLET | ORAL | 1 refills | Status: DC
Start: 2020-12-11 — End: 2021-05-24

## 2020-12-11 NOTE — Telephone Encounter (Signed)
Requested medication (s) are due for refill today:  yes  Requested medication (s) are on the active medication list: yes  Last refill:  09/21/2020  Future visit scheduled: no   Notes to clinic:needs labs and office visit  Message sent for patient to contact office    Requested Prescriptions  Pending Prescriptions Disp Refills   clopidogrel (PLAVIX) 75 MG tablet 90 tablet 0    Sig: TAKE 1 TABLET (75 MG TOTAL) BY MOUTH DAILY.     Hematology: Antiplatelets - clopidogrel Failed - 12/11/2020  1:20 PM      Failed - Evaluate AST, ALT within 2 months of therapy initiation.      Failed - HCT in normal range and within 180 days    Hematocrit  Date Value Ref Range Status  05/25/2020 31.0 (L) 34.0 - 46.6 % Final          Failed - HGB in normal range and within 180 days    Hemoglobin  Date Value Ref Range Status  05/25/2020 9.6 (L) 11.1 - 15.9 g/dL Final          Failed - PLT in normal range and within 180 days    Platelets  Date Value Ref Range Status  05/25/2020 398 150 - 450 x10E3/uL Final          Failed - Valid encounter within last 6 months    Recent Outpatient Visits           6 months ago Annual physical exam   [place], [place] W, NP   1 year ago Encounter for annual physical exam   [place], [place] W, NP   1 year ago Cerebrovascular accident (CVA) due to occlusion of right posterior cerebral artery [facility])   [place], Vernia Buff, NP   2 years ago Well woman exam without gynecological exam   [place], [place] W, NP   3 years ago Essential hypertension   [place], [place] W, NP              Passed - ALT in normal range and within 360 days    ALT  Date Value Ref Range Status  05/25/2020 16 0 - 32 IU/L Final          Passed - AST in normal range and within 360 days     AST  Date Value Ref Range Status  05/25/2020 18 0 - 40 IU/L Final           metoprolol succinate (TOPROL-XL) 50 MG 24 hr tablet 90 tablet 0    Sig: TAKE 1 TABLET BY MOUTH ONCE DAILY WITH OR IMMEDIATELY FOLLOWING A MEAL     Cardiovascular:  Beta Blockers Failed - 12/11/2020  1:20 PM      Failed - Valid encounter within last 6 months    Recent Outpatient Visits           6 months ago Annual physical exam   [place], Vernia Buff, NP   1 year ago Encounter for annual physical exam   [place], [place] W, NP   1 year ago Cerebrovascular accident (CVA) due to occlusion of right posterior cerebral artery [facility])   [place], Vernia Buff, NP   2 years ago Well woman  exam without gynecological exam   [place] Gildardo Pounds, NP   3 years ago Essential hypertension   [place], [place] W, NP              Passed - Last BP in normal range    BP Readings from Last 1 Encounters:  05/25/20 112/66          Passed - Last Heart Rate in normal range    Pulse Readings from Last 1 Encounters:  05/25/20 92

## 2020-12-16 ENCOUNTER — Other Ambulatory Visit: Payer: Self-pay

## 2020-12-28 ENCOUNTER — Encounter: Payer: Self-pay | Admitting: Nurse Practitioner

## 2020-12-28 ENCOUNTER — Other Ambulatory Visit: Payer: Self-pay

## 2020-12-28 ENCOUNTER — Other Ambulatory Visit: Payer: Self-pay | Admitting: Nurse Practitioner

## 2020-12-28 DIAGNOSIS — I10 Essential (primary) hypertension: Secondary | ICD-10-CM

## 2020-12-28 MED ORDER — HYDROCHLOROTHIAZIDE 12.5 MG PO CAPS
ORAL_CAPSULE | Freq: Every day | ORAL | 0 refills | Status: DC
Start: 2020-12-28 — End: 2021-03-15
  Filled 2020-12-28: qty 90, 90d supply, fill #0

## 2020-12-28 NOTE — Telephone Encounter (Signed)
Requested Prescriptions  Pending Prescriptions Disp Refills  . hydrochlorothiazide (MICROZIDE) 12.5 MG capsule 90 capsule 0    Sig: TAKE 1 TABLET (12.5 MG TOTAL) BY MOUTH DAILY.     Cardiovascular: Diuretics - Thiazide Failed - 12/28/2020 10:13 AM      Failed - Valid encounter within last 6 months    Recent Outpatient Visits          7 months ago Annual physical exam   [place], Vernia Buff, NP   1 year ago Encounter for annual physical exam   [place], [place] W, NP   2 years ago Cerebrovascular accident (CVA) due to occlusion of right posterior cerebral artery [facility])   [place], Vernia Buff, NP   2 years ago Well woman exam without gynecological exam   [place], Vernia Buff, NP   3 years ago Essential hypertension   [place], Vernia Buff, NP      Future Appointments            In 1 month Gildardo Pounds, NP [place] in normal range and within 360 days    Calcium  Date Value Ref Range Status  05/25/2020 9.1 8.7 - 10.2 mg/dL Final   Calcium, Ion  Date Value Ref Range Status  10/20/2007 1.15  Final         Passed - Cr in normal range and within 360 days    Creat  Date Value Ref Range Status  11/10/2015 0.92 0.50 - 1.10 mg/dL Final   Creatinine, Ser  Date Value Ref Range Status  05/25/2020 0.76 0.57 - 1.00 mg/dL Final         Passed - K in normal range and within 360 days    Potassium  Date Value Ref Range Status  05/25/2020 3.6 3.5 - 5.2 mmol/L Final         Passed - Na in normal range and within 360 days    Sodium  Date Value Ref Range Status  05/25/2020 140 134 - 144 mmol/L Final         Passed - Last BP in normal range    BP Readings from Last 1 Encounters:  05/25/20 112/66

## 2021-01-14 ENCOUNTER — Other Ambulatory Visit: Payer: Self-pay

## 2021-01-21 ENCOUNTER — Other Ambulatory Visit: Payer: Self-pay

## 2021-01-29 ENCOUNTER — Other Ambulatory Visit: Payer: Self-pay

## 2021-02-08 ENCOUNTER — Encounter: Payer: BC Managed Care – PPO | Admitting: Nurse Practitioner

## 2021-03-15 ENCOUNTER — Other Ambulatory Visit: Payer: Self-pay | Admitting: Family Medicine

## 2021-03-15 DIAGNOSIS — I10 Essential (primary) hypertension: Secondary | ICD-10-CM

## 2021-03-15 NOTE — Telephone Encounter (Signed)
Requested Prescriptions  Pending Prescriptions Disp Refills  . hydrochlorothiazide (MICROZIDE) 12.5 MG capsule [Pharmacy Med Name: hydroCHLOROthiazide 12.5 MG Oral Capsule] 90 capsule 0    Sig: Take 1 capsule by mouth once daily     Cardiovascular: Diuretics - Thiazide Failed - 03/15/2021 11:30 AM      Failed - Valid encounter within last 6 months    Recent Outpatient Visits          9 months ago Annual physical exam   [place], [place] W, NP   2 years ago Encounter for annual physical exam   [place], [place] W, NP   2 years ago Cerebrovascular accident (CVA) due to occlusion of right posterior cerebral artery [facility])   [place], Vernia Buff, NP   3 years ago Well woman exam without gynecological exam   [place], [place] W, NP   3 years ago Essential hypertension   [place], Vernia Buff, NP      Future Appointments            In 4 days Gildardo Pounds, NP [place] in normal range and within 360 days    Calcium  Date Value Ref Range Status  05/25/2020 9.1 8.7 - 10.2 mg/dL Final   Calcium, Ion  Date Value Ref Range Status  10/20/2007 1.15  Final         Passed - Cr in normal range and within 360 days    Creat  Date Value Ref Range Status  11/10/2015 0.92 0.50 - 1.10 mg/dL Final   Creatinine, Ser  Date Value Ref Range Status  05/25/2020 0.76 0.57 - 1.00 mg/dL Final         Passed - K in normal range and within 360 days    Potassium  Date Value Ref Range Status  05/25/2020 3.6 3.5 - 5.2 mmol/L Final         Passed - Na in normal range and within 360 days    Sodium  Date Value Ref Range Status  05/25/2020 140 134 - 144 mmol/L Final         Passed - Last BP in normal range    BP Readings from Last 1 Encounters:   05/25/20 112/66

## 2021-03-19 ENCOUNTER — Encounter: Payer: BC Managed Care – PPO | Admitting: Nurse Practitioner

## 2021-05-24 ENCOUNTER — Other Ambulatory Visit: Payer: Self-pay | Admitting: Nurse Practitioner

## 2021-05-24 DIAGNOSIS — I10 Essential (primary) hypertension: Secondary | ICD-10-CM

## 2021-05-24 NOTE — Telephone Encounter (Signed)
Requested medications are due for refill today.  yes  Requested medications are on the active medications list.  yes  Last refill. Microzide 03/15/2021, Metoprolol 12/11/2020  Future visit scheduled.   yes  Notes to clinic.  Pt is more than 3 months overdue for OV. Labs are expired    Requested Prescriptions  Pending Prescriptions Disp Refills   hydrochlorothiazide (MICROZIDE) 12.5 MG capsule 90 capsule 0    Sig: Take 1 capsule (12.5 mg total) by mouth daily.     Cardiovascular: Diuretics - Thiazide Failed - 05/24/2021  9:22 PM      Failed - Ca in normal range and within 360 days    Calcium  Date Value Ref Range Status  05/25/2020 9.1 8.7 - 10.2 mg/dL Final   Calcium, Ion  Date Value Ref Range Status  10/20/2007 1.15  Final          Failed - Cr in normal range and within 360 days    Creat  Date Value Ref Range Status  11/10/2015 0.92 0.50 - 1.10 mg/dL Final   Creatinine, Ser  Date Value Ref Range Status  05/25/2020 0.76 0.57 - 1.00 mg/dL Final          Failed - K in normal range and within 360 days    Potassium  Date Value Ref Range Status  05/25/2020 3.6 3.5 - 5.2 mmol/L Final          Failed - Na in normal range and within 360 days    Sodium  Date Value Ref Range Status  05/25/2020 140 134 - 144 mmol/L Final          Failed - Valid encounter within last 6 months    Recent Outpatient Visits           12 months ago Annual physical exam   [place], Vernia Buff, NP   2 years ago Encounter for annual physical exam   [place], [place] W, NP   2 years ago Cerebrovascular accident (CVA) due to occlusion of right posterior cerebral artery [facility])   [place], Vernia Buff, NP   3 years ago Well woman exam without gynecological exam   [place], Vernia Buff, NP   4 years ago Essential hypertension   [place], Vernia Buff, NP       Future Appointments             In 1 month Gildardo Pounds, NP [place] BP in normal range    BP Readings from Last 1 Encounters:  05/25/20 112/66           metoprolol succinate (TOPROL-XL) 50 MG 24 hr tablet 90 tablet 1    Sig: TAKE 1 TABLET BY MOUTH ONCE DAILY WITH OR IMMEDIATELY FOLLOWING A MEAL     Cardiovascular:  Beta Blockers Failed - 05/24/2021  9:22 PM      Failed - Valid encounter within last 6 months    Recent Outpatient Visits           12 months ago Annual physical exam   [place], Vernia Buff, NP   2 years ago Encounter for annual physical exam   [place]  Gildardo Pounds, NP   2 years ago Cerebrovascular accident (CVA) due to occlusion of right posterior cerebral artery [facility])   [place], Zelda W, NP   3 years ago Well woman exam without gynecological exam   [place] Gildardo Pounds, NP   4 years ago Essential hypertension   [place], Zelda W, NP       Future Appointments             In 1 month Gildardo Pounds, NP [place] BP in normal range    BP Readings from Last 1 Encounters:  05/25/20 112/66          Passed - Last Heart Rate in normal range    Pulse Readings from Last 1 Encounters:  05/25/20 92

## 2021-05-24 NOTE — Telephone Encounter (Signed)
Medication Refill - Medication: hydrochlorothiazide (MICROZIDE) 12.5 MG capsule metoprolol succinate (TOPROL-XL) 50 MG 24 hr tablet clopidogrel (PLAVIX) 75 MG tablet   90 day supply  Has the patient contacted their pharmacy? No. Pt called to make apt, and realized she will be out of medication before her appt. (Preferred Pharmacy (with phone number or street name): [place], [place].  Has the patient been seen for an appointment in the last year OR does the patient have an upcoming appointment? Yes.    Agent: Please be advised that RX refills may take up to 3 business days. We ask that you follow-up with your pharmacy.

## 2021-05-26 ENCOUNTER — Other Ambulatory Visit: Payer: Self-pay

## 2021-05-26 MED ORDER — METOPROLOL SUCCINATE ER 50 MG PO TB24
50.0000 mg | ORAL_TABLET | Freq: Every day | ORAL | 0 refills | Status: DC
Start: 2021-05-26 — End: 2021-07-05
  Filled 2021-05-26 – 2021-06-04 (×2): qty 30, 30d supply, fill #0

## 2021-05-26 MED ORDER — HYDROCHLOROTHIAZIDE 12.5 MG PO CAPS
12.5000 mg | ORAL_CAPSULE | Freq: Every day | ORAL | 0 refills | Status: DC
  Filled 2021-05-26 – 2021-06-04 (×2): qty 30, 30d supply, fill #0

## 2021-06-02 ENCOUNTER — Other Ambulatory Visit: Payer: Self-pay

## 2021-06-04 ENCOUNTER — Other Ambulatory Visit: Payer: Self-pay

## 2021-06-07 ENCOUNTER — Other Ambulatory Visit: Payer: Self-pay

## 2021-07-05 ENCOUNTER — Other Ambulatory Visit: Payer: Self-pay

## 2021-07-05 ENCOUNTER — Encounter: Payer: Self-pay | Admitting: Nurse Practitioner

## 2021-07-05 ENCOUNTER — Ambulatory Visit: Payer: BC Managed Care – PPO | Attending: Nurse Practitioner | Admitting: Nurse Practitioner

## 2021-07-05 VITALS — BP 123/78 | HR 91 | Resp 18 | Ht 67.0 in | Wt 207.2 lb

## 2021-07-05 DIAGNOSIS — Z0001 Encounter for general adult medical examination with abnormal findings: Secondary | ICD-10-CM

## 2021-07-05 DIAGNOSIS — Z1211 Encounter for screening for malignant neoplasm of colon: Secondary | ICD-10-CM

## 2021-07-05 DIAGNOSIS — D500 Iron deficiency anemia secondary to blood loss (chronic): Secondary | ICD-10-CM

## 2021-07-05 DIAGNOSIS — E782 Mixed hyperlipidemia: Secondary | ICD-10-CM | POA: Diagnosis not present

## 2021-07-05 DIAGNOSIS — Z1231 Encounter for screening mammogram for malignant neoplasm of breast: Secondary | ICD-10-CM

## 2021-07-05 DIAGNOSIS — I63531 Cerebral infarction due to unspecified occlusion or stenosis of right posterior cerebral artery: Secondary | ICD-10-CM

## 2021-07-05 DIAGNOSIS — R7303 Prediabetes: Secondary | ICD-10-CM | POA: Diagnosis not present

## 2021-07-05 DIAGNOSIS — Z0000 Encounter for general adult medical examination without abnormal findings: Secondary | ICD-10-CM

## 2021-07-05 DIAGNOSIS — I10 Essential (primary) hypertension: Secondary | ICD-10-CM | POA: Diagnosis not present

## 2021-07-05 MED ORDER — HYDROCHLOROTHIAZIDE 12.5 MG PO CAPS
12.5000 mg | ORAL_CAPSULE | Freq: Every day | ORAL | 1 refills | Status: DC
  Filled 2021-07-05: qty 90, 90d supply, fill #0
  Filled 2021-10-14: qty 90, 90d supply, fill #1

## 2021-07-05 MED ORDER — ATORVASTATIN CALCIUM 10 MG PO TABS
ORAL_TABLET | Freq: Every day | ORAL | 2 refills | Status: DC
  Filled 2021-07-05 – 2021-09-15 (×2): qty 90, 90d supply, fill #0
  Filled 2021-12-28: qty 30, 30d supply, fill #1
  Filled 2022-01-28 – 2022-01-31 (×2): qty 90, 90d supply, fill #2
  Filled 2022-01-31: qty 30, 30d supply, fill #2
  Filled 2022-02-28: qty 30, 30d supply, fill #3
  Filled 2022-04-07: qty 30, 30d supply, fill #4
  Filled 2022-05-06: qty 30, 30d supply, fill #5
  Filled 2022-06-05: qty 30, 30d supply, fill #6

## 2021-07-05 MED ORDER — METOPROLOL SUCCINATE ER 50 MG PO TB24
50.0000 mg | ORAL_TABLET | Freq: Every day | ORAL | 1 refills | Status: DC
  Filled 2021-07-05: qty 90, 90d supply, fill #0
  Filled 2021-10-14: qty 90, 90d supply, fill #1

## 2021-07-05 NOTE — Patient Instructions (Signed)
Ms. Mcvicker please call the mammogram center and have your mammogram scheduled.  ?[address] ?[place],  [address] ?Phone [phone] ?

## 2021-07-05 NOTE — Progress Notes (Signed)
Assessment & Plan:  Ruth Conley was seen today for annual exam.  Diagnoses and all orders for this visit:  Annual physical exam  Colon cancer screening -     Ambulatory referral to Gastroenterology  Essential hypertension -     CMP14+EGFR -     metoprolol succinate (TOPROL-XL) 50 MG 24 hr tablet; Take 1 tablet (50 mg total) by mouth daily. -     hydrochlorothiazide (MICROZIDE) 12.5 MG capsule; Take 1 capsule (12.5 mg total) by mouth daily.  Iron deficiency anemia due to chronic blood loss -     CBC  Mixed hyperlipidemia -     Lipid panel -     atorvastatin (LIPITOR) 10 MG tablet; TAKE 1 TABLET (10 MG TOTAL) BY MOUTH DAILY. INSTRUCTIONS: Work on a low fat, heart healthy diet and participate in regular aerobic exercise program by working out at least 150 minutes per week; 5 days a week-30 minutes per day. Avoid red meat/beef/steak,  fried foods. junk foods, sodas, sugary drinks, unhealthy snacking, alcohol and smoking.  Drink at least 80 oz of water per day and monitor your carbohydrate intake daily.    Prediabetes -     Hemoglobin A1c  Encounter for screening mammogram for malignant neoplasm of breast -     MM DIGITAL SCREENING BILATERAL; Future  Cerebrovascular accident (CVA) due to occlusion of right posterior cerebral artery (HCC) NO RESIDUAL DEFICITS -     atorvastatin (LIPITOR) 10 MG tablet; TAKE 1 TABLET (10 MG TOTAL) BY MOUTH DAILY.    Patient has been counseled on age-appropriate routine health concerns for screening and prevention. These are reviewed and up-to-date. Referrals have been placed accordingly. Immunizations are up-to-date or declined.    Subjective:   Chief Complaint  Patient presents with   Annual Exam   HPI Ruth Conley 56 y.o. female presents to office today for annual physical exam.  She has a past medical history of Carotid stenosis, Hypertension, Stroke, and TIA  (2016).   Needs to follow up with GYN for menorrhagia and fibroids with  history of anemia.   HTN Well controlled with HCTZ 25 mg daily and toprol XL 50 mg daily.  BP Readings from Last 3 Encounters:  07/05/21 123/78  05/25/20 112/66  05/27/19 115/79    Prediabetes Well controlled with diet and weight management.  Lab Results  Component Value Date   HGBA1C 5.6 07/05/2021    Review of Systems  Constitutional: Negative.  Negative for chills, fever, malaise/fatigue and weight loss.  HENT: Negative.  Negative for nosebleeds.   Eyes: Negative.  Negative for blurred vision, double vision and photophobia.  Respiratory: Negative.  Negative for cough, sputum production, shortness of breath and wheezing.   Cardiovascular: Negative.  Negative for chest pain, palpitations and leg swelling.  Gastrointestinal: Negative.  Negative for abdominal pain, blood in stool, constipation, diarrhea, heartburn, melena, nausea and vomiting.  Genitourinary:        SEE HPI  Musculoskeletal: Negative.  Negative for myalgias.  Skin: Negative.  Negative for rash.  Neurological: Negative.  Negative for dizziness, tremors, speech change, focal weakness, seizures and headaches.  Psychiatric/Behavioral: Negative.  Negative for depression and suicidal ideas. The patient is not nervous/anxious and does not have insomnia.    Past Medical History:  Diagnosis Date   Carotid stenosis    a. 08/2014: dopplers showing 40-59% RICA stenosis and 1-39% LICA stenosis   Hypertension    Stroke [facility])    TIA (transient ischemic attack) 2016  Past Surgical History:  Procedure Laterality Date   CESAREAN SECTION     TRANSTHORACIC ECHOCARDIOGRAM  04/2015   (no change from 08/2014) Normal LV size and thickness. EF 60-65%. GR 1 DD. No wall motion abnormality. No source of embolism noted    Family History  Problem Relation Age of Onset   Hypertension Mother    CAD Father 88   Stroke Paternal Uncle     Social History Reviewed with no changes to be made today.   Outpatient Medications Prior to  Visit  Medication Sig Dispense Refill   Cholecalciferol (VITAMIN D3 PO) Take by mouth.     Coenzyme Q-10 100 MG capsule Take 100 mg by mouth daily. Reported on 11/10/2015     lactobacillus acidophilus (BACID) TABS tablet Take 2 tablets by mouth 3 (three) times daily.     Multiple Vitamins-Minerals (MULTIVITAMIN WITH MINERALS) tablet Take 1 tablet by mouth daily.     Omega 3 1200 MG CAPS Take 600 mg by mouth daily.     vitamin B-12 (CYANOCOBALAMIN) 100 MCG tablet Take 100 mcg by mouth daily.     atorvastatin (LIPITOR) 10 MG tablet TAKE 1 TABLET (10 MG TOTAL) BY MOUTH DAILY. 90 tablet 2   hydrochlorothiazide (MICROZIDE) 12.5 MG capsule Take 1 capsule (12.5 mg total) by mouth daily. 30 capsule 0   metoprolol succinate (TOPROL-XL) 50 MG 24 hr tablet Take 1 tablet (50 mg total) by mouth daily. 30 tablet 0   ferrous sulfate 325 (65 FE) MG tablet TAKE 1 TABLET (325 MG TOTAL) BY MOUTH EVERY OTHER DAY. 90 tablet 2   No facility-administered medications prior to visit.    Allergies  Allergen Reactions   Lisinopril Cough       Objective:    BP 123/78   Pulse 91   Resp 18   Ht 5\' 7"  (1.702 m)   Wt 207 lb 4 oz (94 kg)   LMP 06/14/2021   SpO2 98%   BMI 32.46 kg/m  Wt Readings from Last 3 Encounters:  07/05/21 207 lb 4 oz (94 kg)  05/25/20 216 lb (98 kg)  05/27/19 210 lb (95.3 kg)    Physical Exam Constitutional:      Appearance: She is well-developed.  HENT:     Head: Normocephalic and atraumatic.     Right Ear: Hearing, tympanic membrane, ear canal and external ear normal.     Left Ear: Hearing, tympanic membrane, ear canal and external ear normal.     Nose: Nose normal.     Right Turbinates: Not enlarged.     Left Turbinates: Not enlarged.     Mouth/Throat:     Lips: Pink.     Mouth: Mucous membranes are moist.     Dentition: No dental tenderness, gingival swelling, dental abscesses or gum lesions.     Pharynx: No oropharyngeal exudate.  Eyes:     General: No scleral  icterus.       Right eye: No discharge.     Extraocular Movements: Extraocular movements intact.     Conjunctiva/sclera: Conjunctivae normal.     Pupils: Pupils are equal, round, and reactive to light.  Neck:     Thyroid: No thyromegaly.     Trachea: No tracheal deviation.  Cardiovascular:     Rate and Rhythm: Normal rate and regular rhythm.     Heart sounds: Normal heart sounds. No murmur heard.   No friction rub.  Pulmonary:     Effort: Pulmonary effort is normal. No  accessory muscle usage or respiratory distress.     Breath sounds: Normal breath sounds. No decreased breath sounds, wheezing, rhonchi or rales.  Abdominal:     General: Bowel sounds are normal. There is no distension.     Palpations: Abdomen is soft. There is no mass.     Tenderness: There is no abdominal tenderness. There is no right CVA tenderness, left CVA tenderness, guarding or rebound.     Hernia: No hernia is present.  Musculoskeletal:        General: No tenderness or deformity. Normal range of motion.     Cervical back: Normal range of motion and neck supple.  Lymphadenopathy:     Cervical: No cervical adenopathy.  Skin:    General: Skin is warm and dry.     Findings: No erythema.  Neurological:     Mental Status: She is alert and oriented to person, place, and time.     Cranial Nerves: No cranial nerve deficit.     Motor: Motor function is intact.     Coordination: Coordination is intact. Coordination normal.     Gait: Gait is intact.     Deep Tendon Reflexes:     Reflex Scores:      Patellar reflexes are 1+ on the right side and 1+ on the left side. Psychiatric:        Attention and Perception: Attention normal.        Mood and Affect: Mood normal.        Speech: Speech normal.        Behavior: Behavior normal.        Thought Content: Thought content normal.        Judgment: Judgment normal.         Patient has been counseled extensively about nutrition and exercise as well as the importance  of adherence with medications and regular follow-up. The patient was given clear instructions to go to ER or return to medical center if symptoms don't improve, worsen or new problems develop. The patient verbalized understanding.   Follow-up: Return in about 3 months (around 10/05/2021).   Claiborne Rigg, FNP-BC [facility] [place], [place] [phone]   07/06/2021, 9:32 PM

## 2021-07-06 ENCOUNTER — Encounter: Payer: Self-pay | Admitting: Nurse Practitioner

## 2021-07-06 LAB — CMP14+EGFR
ALT: 12 IU/L (ref 0–32)
AST: 17 IU/L (ref 0–40)
Albumin/Globulin Ratio: 1.8 (ref 1.2–2.2)
Albumin: 4.3 g/dL (ref 3.8–4.9)
Alkaline Phosphatase: 90 IU/L (ref 44–121)
BUN/Creatinine Ratio: 15 (ref 9–23)
BUN: 11 mg/dL (ref 6–24)
Bilirubin Total: <0.2 mg/dL (ref 0.0–1.2)
CO2: 23 mmol/L (ref 20–29)
Calcium: 9.1 mg/dL (ref 8.7–10.2)
Chloride: 102 mmol/L (ref 96–106)
Creatinine, Ser: 0.75 mg/dL (ref 0.57–1.00)
Globulin, Total: 2.4 g/dL (ref 1.5–4.5)
Glucose: 116 mg/dL — ABNORMAL HIGH (ref 70–99)
Potassium: 4.4 mmol/L (ref 3.5–5.2)
Sodium: 138 mmol/L (ref 134–144)
Total Protein: 6.7 g/dL (ref 6.0–8.5)
eGFR: 95 mL/min/{1.73_m2} (ref >59)

## 2021-07-06 LAB — LIPID PANEL
Chol/HDL Ratio: 2.3 ratio (ref 0.0–4.4)
Cholesterol, Total: 169 mg/dL (ref 100–199)
HDL: 75 mg/dL (ref >39)
LDL Chol Calc (NIH): 74 mg/dL (ref 0–99)
Triglycerides: 115 mg/dL (ref 0–149)
VLDL Cholesterol Cal: 20 mg/dL (ref 5–40)

## 2021-07-06 LAB — HEMOGLOBIN A1C
Est. average glucose Bld gHb Est-mCnc: 114 mg/dL
Hgb A1c MFr Bld: 5.6 % (ref 4.8–5.6)

## 2021-07-06 LAB — CBC
Hematocrit: 33.7 % — ABNORMAL LOW (ref 34.0–46.6)
Hemoglobin: 10.6 g/dL — ABNORMAL LOW (ref 11.1–15.9)
MCH: 26.1 pg — ABNORMAL LOW (ref 26.6–33.0)
MCHC: 31.5 g/dL (ref 31.5–35.7)
MCV: 83 fL (ref 79–97)
Platelets: 340 10*3/uL (ref 150–450)
RBC: 4.06 x10E6/uL (ref 3.77–5.28)
RDW: 15.2 % (ref 11.7–15.4)
WBC: 6.1 10*3/uL (ref 3.4–10.8)

## 2021-07-09 ENCOUNTER — Other Ambulatory Visit: Payer: Self-pay

## 2021-07-12 ENCOUNTER — Other Ambulatory Visit: Payer: Self-pay

## 2021-07-19 ENCOUNTER — Ambulatory Visit
Admission: RE | Admit: 2021-07-19 | Discharge: 2021-07-19 | Disposition: A | Discharge status: Home / Self Care | Payer: BC Managed Care – PPO | Source: Ambulatory Visit | Attending: Nurse Practitioner | Admitting: Nurse Practitioner

## 2021-07-19 DIAGNOSIS — Z1231 Encounter for screening mammogram for malignant neoplasm of breast: Secondary | ICD-10-CM

## 2021-07-22 ENCOUNTER — Encounter: Payer: Self-pay | Admitting: Nurse Practitioner

## 2021-08-13 ENCOUNTER — Other Ambulatory Visit (HOSPITAL_COMMUNITY)
Admission: RE | Admit: 2021-08-13 | Discharge: 2021-08-13 | Disposition: A | Discharge status: Home / Self Care | Payer: BC Managed Care – PPO | Source: Ambulatory Visit | Attending: Obstetrics and Gynecology | Admitting: Obstetrics and Gynecology

## 2021-08-13 ENCOUNTER — Encounter: Payer: Self-pay | Admitting: Obstetrics and Gynecology

## 2021-08-13 ENCOUNTER — Ambulatory Visit (INDEPENDENT_AMBULATORY_CARE_PROVIDER_SITE_OTHER): Payer: BC Managed Care – PPO | Admitting: Obstetrics and Gynecology

## 2021-08-13 VITALS — BP 118/74 | HR 82 | Ht 67.0 in | Wt 215.0 lb

## 2021-08-13 DIAGNOSIS — Z01419 Encounter for gynecological examination (general) (routine) without abnormal findings: Secondary | ICD-10-CM | POA: Insufficient documentation

## 2021-08-13 DIAGNOSIS — N920 Excessive and frequent menstruation with regular cycle: Secondary | ICD-10-CM | POA: Diagnosis not present

## 2021-08-13 DIAGNOSIS — D259 Leiomyoma of uterus, unspecified: Secondary | ICD-10-CM

## 2021-08-13 NOTE — Progress Notes (Signed)
? ? ?GYNECOLOGY ANNUAL PREVENTATIVE CARE ENCOUNTER NOTE ? ?History:    ? Ruth Conley is a 56 y.o. G73P2002 female here for a routine annual gynecologic exam.  Current complaints: heavy menstrual bleeding.   Denies pelvic pain, problems with intercourse .  ? ?Pt has continued heavy menstrual bleeding.  She has tried other interventions previously including megace and progesterone IUD.  The IUD was expelled and pt discontinued the medication due to weight gain and other side effects.  Pt does have hx of carotid stenosis, HTN and stroke.  Pt continues to take plavix for prophylaxis. ?  ?Gynecologic History ?Ruth Conley's last menstrual period was 08/07/2021 (exact date). ?Contraception: none ?Last Pap: 2019. Results were: ASCUS with negative HPV ?Last mammogram: 07/19/21. Results were: normal ? ?Obstetric History ?OB History  ?Gravida Para Term Preterm AB Living  ?'2 2 2     2  '$ ?SAB IAB Ectopic Multiple Live Births  ?        2  ?  ?# Outcome Date GA Lbr Len/2nd Weight Sex Delivery Anes PTL Lv  ?2 Term 1997     VBAC   LIV  ?1 Term 1988     CS-Unspec   LIV  ?   Birth Comments: preeclampsia  ? ? ?Past Medical History:  ?Diagnosis Date  ? Carotid stenosis   ? a. 08/2014: dopplers showing 40-59% RICA stenosis and 0-37% LICA stenosis  ? Hypertension   ? Stroke [facility])   ? TIA (transient ischemic attack) 2016  ? ? ?Past Surgical History:  ?Procedure Laterality Date  ? CESAREAN SECTION    ? TRANSTHORACIC ECHOCARDIOGRAM  04/2015  ? (no change from 08/2014) Normal LV size and thickness. EF 60-65%. GR 1 DD. No wall motion abnormality. No source of embolism noted  ? ? ?Current Outpatient Medications on File Prior to Visit  ?Medication Sig Dispense Refill  ? atorvastatin (LIPITOR) 10 MG tablet TAKE 1 TABLET (10 MG TOTAL) BY MOUTH DAILY. 90 tablet 2  ? Cholecalciferol (VITAMIN D3 PO) Take by mouth.    ? Coenzyme Q-10 100 MG capsule Take 100 mg by mouth daily. Reported on 11/10/2015    ? hydrochlorothiazide (MICROZIDE) 12.5 MG capsule  Take 1 capsule (12.5 mg total) by mouth daily. 90 capsule 1  ? lactobacillus acidophilus (BACID) TABS tablet Take 2 tablets by mouth 3 (three) times daily.    ? metoprolol succinate (TOPROL-XL) 50 MG 24 hr tablet Take 1 tablet (50 mg total) by mouth daily. 90 tablet 1  ? Multiple Vitamins-Minerals (MULTIVITAMIN WITH MINERALS) tablet Take 1 tablet by mouth daily.    ? Omega 3 1200 MG CAPS Take 600 mg by mouth daily.    ? vitamin B-12 (CYANOCOBALAMIN) 100 MCG tablet Take 100 mcg by mouth daily.    ? clopidogrel (PLAVIX) 75 MG tablet Take 75 mg by mouth daily.    ? ferrous sulfate 325 (65 FE) MG tablet TAKE 1 TABLET (325 MG TOTAL) BY MOUTH EVERY OTHER DAY. 90 tablet 2  ? ?No current facility-administered medications on file prior to visit.  ? ? ?Allergies  ?Allergen Reactions  ? Lisinopril Cough  ? ? ?Social History:  reports that she has never smoked. She has never used smokeless tobacco. She reports that she does not drink alcohol and does not use drugs. ? ?Family History  ?Problem Relation Age of Onset  ? Hypertension Mother   ? CAD Father 40  ? Stroke Paternal Uncle   ? ? ?The following portions of the Ruth Conley's  history were reviewed and updated as appropriate: allergies, current medications, past family history, past medical history, past social history, past surgical history and problem list. ? ?Review of Systems ?Pertinent items noted in HPI and remainder of comprehensive ROS otherwise negative. ? ?Physical Exam:  ?BP 118/74   Pulse 82   Ht '5\' 7"'$  (1.702 m)   Wt 215 lb (97.5 kg)   LMP 08/07/2021 (Exact Date)   BMI 33.67 kg/m?  ?CONSTITUTIONAL: Well-developed, well-nourished female in no acute distress.  ?HENT:  Normocephalic, atraumatic, External right and left ear normal. Oropharynx is clear and moist ?EYES: Conjunctivae and EOM are normal.  ?NECK: Normal range of motion, supple, no masses.  Normal thyroid.  ?SKIN: Skin is warm and dry. No rash noted. Not diaphoretic. No erythema. No  pallor. ?MUSCULOSKELETAL: Normal range of motion. No tenderness.  No cyanosis, clubbing, or edema.  2+ distal pulses. ?NEUROLOGIC: Alert and oriented to person, place, and time. Normal reflexes, muscle tone coordination.  ?PSYCHIATRIC: Normal mood and affect. Normal behavior. Normal judgment and thought content. ?CARDIOVASCULAR: Normal heart rate noted, regular rhythm ?RESPIRATORY: Clear to auscultation bilaterally. Effort and breath sounds normal, no problems with respiration noted. ?BREASTS: Symmetric in size. No masses, tenderness, skin changes, nipple drainage, or lymphadenopathy bilaterally. Performed in the presence of a chaperone. ?ABDOMEN: Soft, no distention noted.  No tenderness, rebound or guarding.  ?PELVIC: Normal appearing external genitalia and urethral meatus; normal appearing vaginal mucosa and cervix.  No abnormal discharge noted.  Pap smear obtained.  Slightly enlarged uterine size, no other palpable masses, no uterine or adnexal tenderness.  Performed in the presence of a chaperone. ?  ?Assessment and Plan:  ?  1. Women's annual routine gynecological examination ?Normal annual exam ?- Cytology - PAP( ) ? ?2. Uterine leiomyoma, unspecified location ?Uterus slightly enlarged, irregular contour ? ?- US PELVIC COMPLETE WITH TRANSVAGINAL; Future ? ?3. Menorrhagia with regular cycle ?Discussed treatment options with Ruth Conley.  I am uneasy using lysteda for treatment and I believe myfembree would be contraindicated due to hx of stroke. ? ?Current treatment options could include uterine fibroid embolization, Sonata procedure or hysterectomy.  Would try minimally invasive therapy due to her medical history if possible.  Pt did fill out [employer] paperwork today. ? ?- US PELVIC COMPLETE WITH TRANSVAGINAL; Future ? ?Will follow up results of pap smear and manage accordingly. ?Routine preventative health maintenance measures emphasized. ?Please refer to After Visit Summary for other  counseling recommendations.  ?   ?F/u virtual visit in 3-4 weeks to discuss decision on treatment ? ?Ruth Shields, MD, FACOG ?Obstetrician Social research officer, government, Faculty Practice ?[facility] [place]  ?

## 2021-08-13 NOTE — Progress Notes (Addendum)
56 y.o presents for AEX/PAP.  C/o heavy periods lasting 3-4 days, cramps 7-9/10.  She is changing the overnight pads every 30 minutes.  She would like something done about her Fibroids and heavy periods. ?

## 2021-08-16 LAB — CYTOLOGY - PAP
Comment: NEGATIVE
Diagnosis: NEGATIVE
High risk HPV: NEGATIVE

## 2021-08-18 ENCOUNTER — Other Ambulatory Visit: Payer: BC Managed Care – PPO

## 2021-08-19 ENCOUNTER — Ambulatory Visit
Admission: RE | Admit: 2021-08-19 | Discharge: 2021-08-19 | Disposition: A | Discharge status: Home / Self Care | Payer: BC Managed Care – PPO | Source: Ambulatory Visit | Attending: Obstetrics and Gynecology | Admitting: Obstetrics and Gynecology

## 2021-08-19 DIAGNOSIS — N920 Excessive and frequent menstruation with regular cycle: Secondary | ICD-10-CM

## 2021-08-19 DIAGNOSIS — D259 Leiomyoma of uterus, unspecified: Secondary | ICD-10-CM

## 2021-09-02 ENCOUNTER — Ambulatory Visit (INDEPENDENT_AMBULATORY_CARE_PROVIDER_SITE_OTHER): Payer: BC Managed Care – PPO | Admitting: Obstetrics and Gynecology

## 2021-09-02 DIAGNOSIS — N920 Excessive and frequent menstruation with regular cycle: Secondary | ICD-10-CM

## 2021-09-02 DIAGNOSIS — D219 Benign neoplasm of connective and other soft tissue, unspecified: Secondary | ICD-10-CM

## 2021-09-02 NOTE — Progress Notes (Signed)
S/w patient for tele-visit. Pt is following up to discuss u/s results from 08/19/21. ?

## 2021-09-02 NOTE — Progress Notes (Signed)
? ? ?GYNECOLOGY VIRTUAL VISIT ENCOUNTER NOTE ? ?Provider location: [facility] at [place]  ? ?Patient location: Home ? ?I connected with Ruth Conley on 09/02/21 at  8:35 AM EDT by MyChart Video Encounter and verified that I am speaking with the correct person using two identifiers. ?  ?I discussed the limitations, risks, security and privacy concerns of performing an evaluation and management service virtually and the availability of in person appointments. I also discussed with the patient that there may be a patient responsible charge related to this service. The patient expressed understanding and agreed to proceed. ?  ?History:  ?Ruth Conley is a 56 y.o. G68P2002 female being evaluated today for discussion of ultrasound results and to answer questions regarding her upcoming Sonata procedure.   ?  ?Past Medical History:  ?Diagnosis Date  ? Carotid stenosis   ? a. 08/2014: dopplers showing 40-59% RICA stenosis and 4-16% LICA stenosis  ? Hypertension   ? Stroke [facility])   ? TIA (transient ischemic attack) 2016  ? ?Past Surgical History:  ?Procedure Laterality Date  ? CESAREAN SECTION    ? TRANSTHORACIC ECHOCARDIOGRAM  04/2015  ? (no change from 08/2014) Normal LV size and thickness. EF 60-65%. GR 1 DD. No wall motion abnormality. No source of embolism noted  ? ?The following portions of the patient's history were reviewed and updated as appropriate: allergies, current medications, past family history, past medical history, past social history, past surgical history and problem list.  ? ?Health Maintenance:  Normal pap and negative HRHPV on 08/13/21.  Normal mammogram on 07/19/21.  ? ?Review of Systems:  ?Pertinent items noted in HPI and remainder of comprehensive ROS otherwise negative. ? ?Physical Exam:  ? ?General:  Alert, oriented and cooperative. Patient appears to be in no acute distress.  ?Mental Status: Normal mood and affect. Normal behavior. Normal judgment and thought content.    ?Respiratory: Normal respiratory effort, no problems with respiration noted  ?Rest of physical exam deferred due to type of encounter ? ?Labs and Imaging ?No results found for this or any previous visit (from the past 336 hour(s)). ?US PELVIC COMPLETE WITH TRANSVAGINAL ? ?Addendum Date: 08/25/2021   ?ADDENDUM REPORT: 08/25/2021 12:10 ADDENDUM: This addendum is in regard to Doppler examination of ovaries. There is color Doppler examination of ovaries in the current study, but not pulsed Doppler examination. Electronically Signed   By: Elmer Picker M.D.   On: 08/25/2021 12:10  ? ?Result Date: 08/25/2021 ?CLINICAL DATA:  Uterine fibroids, menorrhagia EXAM: TRANSABDOMINAL AND TRANSVAGINAL ULTRASOUND OF PELVIS DOPPLER ULTRASOUND OF OVARIES TECHNIQUE: Both transabdominal and transvaginal ultrasound examinations of the pelvis were performed. Transabdominal technique was performed for global imaging of the pelvis including uterus, ovaries, adnexal regions, and pelvic cul-de-sac. It was necessary to proceed with endovaginal exam following the transabdominal exam to visualize the endometrium and ovaries. Color and duplex Doppler ultrasound was utilized to evaluate blood flow to the ovaries. COMPARISON:  05/07/2018 FINDINGS: Uterus Measurements: 12.2 x 8 x 8.2 cm = volume: 420.6 mL. There is inhomogeneous echogenicity in myometrium with multiple fibroids. There is submucosal fibroid measuring 2.5 x 1.5 x 1.7 cm in the anterior aspect. There is 3.4 x 3.7 x 3.7 cm fibroid in the right anterior fundus. There is 4.4 x 3.1 x 3.2 cm fibroid in the left posterior fundus. Endometrium Thickness: 10 mm.  No focal abnormality visualized. Right ovary Measurements: 3.9 x 1.8 x 2.5 cm = volume: 9.2 mL. No adnexal mass is seen. Left  ovary Measurements: 4.1 x 1.9 x 2.8 cm = volume: 11.2 mL. No dominant adnexal masses are seen. Pulsed Doppler evaluation of both ovaries demonstrates normal low-resistance arterial and venous waveforms.  Other findings No abnormal free fluid. IMPRESSION: There are multiple uterine fibroids with possible increase in size. There are no adnexal masses. Electronically Signed: By: Elmer Picker M.D. On: 08/21/2021 13:52     ?  ?Assessment and Plan:  ?   ?1. Menorrhagia with regular cycle ? ?2. Fibroids ?Pt scheduled for Sonata procedure on 11/16/21.  Pt advised she will will need cardiology and cardiovascular surgery clearance.  Pt notes understanding.  Pt will have preop prior to the procedure ? ?   ?  ?I discussed the assessment and treatment plan with the patient. The patient was provided an opportunity to ask questions and all were answered. The patient agreed with the plan and demonstrated an understanding of the instructions. ?  ?The patient was advised to call back or seek an in-person evaluation/go to the ED if the symptoms worsen or if the condition fails to improve as anticipated. ? ?I provided 20 minutes of face-to-face time during this encounter. ? ? ?Griffin Basil, MD ?[facility], [place] [facility]  ?

## 2021-09-03 ENCOUNTER — Telehealth: Payer: BC Managed Care – PPO | Admitting: Obstetrics

## 2021-09-06 ENCOUNTER — Ambulatory Visit: Payer: BC Managed Care – PPO | Admitting: Obstetrics & Gynecology

## 2021-09-15 ENCOUNTER — Other Ambulatory Visit: Payer: Self-pay

## 2021-09-15 MED ORDER — CLOPIDOGREL BISULFATE 75 MG PO TABS
ORAL_TABLET | ORAL | 3 refills | Status: DC
  Filled 2021-09-15: qty 90, 90d supply, fill #0

## 2021-09-16 ENCOUNTER — Other Ambulatory Visit: Payer: Self-pay

## 2021-10-11 ENCOUNTER — Ambulatory Visit: Payer: BC Managed Care – PPO | Admitting: Nurse Practitioner

## 2021-10-14 ENCOUNTER — Other Ambulatory Visit: Payer: Self-pay

## 2021-10-15 ENCOUNTER — Other Ambulatory Visit: Payer: Self-pay

## 2021-10-21 ENCOUNTER — Ambulatory Visit: Payer: BC Managed Care – PPO | Admitting: Obstetrics and Gynecology

## 2021-10-22 ENCOUNTER — Telehealth: Payer: Self-pay | Admitting: Neurology

## 2021-10-31 ENCOUNTER — Encounter: Payer: Self-pay | Admitting: Cardiology

## 2021-10-31 NOTE — Progress Notes (Signed)
Primary Care Provider: Gildardo Pounds, NP [facility] Cardiologist: Glenetta Hew, MD -new OB/GYN: Lynnda Shields, MD Electrophysiologist: None  Clinic Note: Chief Complaint  Patient presents with   Pre-op Exam    Distant history of palpitations.  Has upcoming gynecologic procedure has questions about Plavix and her history of palpitations.    ===================================  ASSESSMENT/PLAN   Problem List Items Addressed This Visit       Cardiology Problems   Hyperlipidemia   Cerebrovascular accident (CVA) due to occlusion of right posterior cerebral artery (HCC) (Chronic)    History of CVA following a TIA both in 2016.  Was placed on Plavix by neurology.  Echocardiogram was relatively normal.  She did not have a monitor until 2018 when she presented with palpitations but there is no signs of A-fib.  We will defer to neurology as far as duration of course of treatment with Plavix.  More than likely they are concerned about the right carotid moderate stenosis as a potential culprit lesion. Regardless she would need to hold Plavix for her surgery and that will be for 5 to 7 days preop and a couple days postop.  Going forward without the fibroids she probably is fine being back on Plavix. The primary care on atorvastatin and omega-3 fatty acids.  Most recent lipid panel has her LDL at 74 which is pretty close to goal less than 70.      Essential hypertension (Chronic)    Blood pressures well controlled today HCTZ and Toprol.   Toprol was likely recently started because of the palpitations.  She does have PVCs and this will keep them subdued.  Would not necessarily titrate up further preoperatively.  I will BP is stable.        Other   Preop cardiovascular exam - Primary    Completely asymptomatic 56 year old woman with no prior cardiac history besides palpitations that are benign.  No need for cardiac evaluation of any sort as she would be a low risk patient for low  risk surgery.  She has had a history of stroke that is her only risk factor.  She is nondiabetic, normal renal function, no prior history of CAD or CHF with no symptoms to suggest that they are present.  She is on Plavix with a decision of timing and duration of holding that would be after neurology.  She is safe and cleared to proceed with noncardiac surgery without any further cardiac evaluation.      History of TIA (transient ischemic attack)   Palpitations    History of palpitations from 2016 has not had any issues since then.  PVCs noted on EKG likely benign is very symptomatic.  She is already on Toprol. .  No signs or symptoms of arrhythmia. Would not do any further evaluation at this point      ===================================  HPI:    Ruth Conley is a 56 y.o. female with a history of TIA in 08/2014 & Small /thalamic CVA in 04/2015), HLD, HTN, benign palpitations, and pre-DM along with uterine fibroids and menorrhagia who is being seen today for the evaluation of Cardiovascular Surgery Clearance at the request of Gildardo Pounds, NP along with Lynnda Shields, MD.  Ruth Conley was seen on Sep 05, 2016 by Bernerd Pho, PA as a new patient evaluation for palpitations.  Noted intermittent palpitations with increasing frequency and severity.  Mostly at rest will sometimes wake her up at night.  Can last anywhere from 5 to 10  minutes.  No other associated chest discomfort dyspnea or lightheadedness or dizziness.  No significant caffeine or alcohol use.  Was exercising 3 to 4 days a week-mostly aerobic exercise.  No exertional symptoms. 30-day monitor ordered => relatively normal as reviewed below.  Patient did not follow back up. Continue Toprol 50 mg daily along with statin and Plavix on board for CVA  Recent Hospitalizations: None  Ruth Conley has been seen by Dr. Elgie Congo for evaluation of menorrhagia with uterine fibroids and has been scheduled for a [place]  procedure on November 16, 2021.  She was advised that she needed cardiology and cardiovascular surgical clearance for this procedure and therefore presents for evaluation.  She has not been seen in our clinic for 5 years and has not had any active cardiac symptoms.  Reviewed  CV studies:    The following studies were reviewed today: (if available, images/films reviewed: From Epic Chart or Care Everywhere) Echo and carotid Dopplers noted in [place]: 30-Day Event Monitor May 2018: Predominantly sinus rhythm-HR range 60-183 bpm with an average of 86 bpm.  No evidence of critical or serious events.  6 stable-manually triggered (all accidental, no symptoms) events including sinus rhythm, sinus tachycardia and sinus rhythm with artifact.  No sustained tachycardia or bradycardia arrhythmias.  Minimal PACs and PVCs noted.   Interval History:   Ruth Conley presents today for cardiology evaluation for preop assessment for upcoming gynecologic procedure for fibroids.  She does say that being on the Plavix is caused her to be more anemic than usual with her fibroids.  That being said, she has not had any significant symptoms of dyspnea with rest or exertion or irregular heartbeats or palpitations while anemic.  She denies any exertional or resting chest pain or pressure.  No heart failure symptoms of PND, orthopnea or edema.  She does have a history of TIA and CVA but has not had any recurrent symptoms of either.  No syncope or near syncope.  She is quite active and travels a lot with work, does a lot of walking around the airports and is on her feet most the day so she may have some mild end of day swelling exacerbated by travel and she does wear support socks when she travels more long distances.  .  She had a PVC on her EKG here today but had no sensation of palpitations.  She has not had any of the palpitations that she had back in 2018.  Nothing prolonged nothing short.  CV Review of Symptoms  (Summary) Cardiovascular ROS: no chest pain or dyspnea on exertion negative for - irregular heartbeat, orthopnea, palpitations, paroxysmal nocturnal dyspnea, rapid heart rate, shortness of breath, or lightheadedness, dizziness or wooziness, syncope/near syncope or TIA/amaurosis fugax, claudication  REVIEWED OF SYSTEMS   Review of Systems  Constitutional:  Negative for malaise/fatigue (Not really not even being anemic.) and weight loss.  HENT:  Negative for congestion and nosebleeds.   Respiratory:  Negative for shortness of breath.   Cardiovascular:  Positive for leg swelling (Mild ankle swelling at the end of the day as noted.).  Gastrointestinal:  Negative for abdominal pain, blood in stool and melena.  Genitourinary:  Negative for dysuria and hematuria.       Heavy menses  Musculoskeletal:  Negative for falls, joint pain and myalgias.  Neurological:  Negative for dizziness, sensory change, speech change and focal weakness.  Psychiatric/Behavioral:  Negative for depression and memory loss. The patient is not nervous/anxious and does not have  insomnia.     I have reviewed and (if needed) personally updated the patient's problem list, medications, allergies, past medical and surgical history, social and family history.   PAST MEDICAL HISTORY   Past Medical History:  Diagnosis Date   Carotid stenosis    a. 08/2014: dopplers showing 40-59% RICA stenosis and 1-27% LICA stenosis   Hypertension    Stroke ([place]) 04/2015   L facial droop & lipis tingling; no residual findings.   TIA (transient ischemic attack) 08/2014   R arm  / hamd went numb & weak    PAST SURGICAL HISTORY   Past Surgical History:  Procedure Laterality Date   CESAREAN SECTION     TRANSTHORACIC ECHOCARDIOGRAM  04/2015   (no change from 08/2014) Normal LV size and thickness. EF 60-65%. GR 1 DD. No wall motion abnormality. No source of embolism noted    Immunization History  Administered Date(s) Administered    Influenza,inj,Quad PF,6+ Mos 02/02/2015, 01/26/2016   PFIZER(Purple Top)SARS-COV-2 Vaccination 01/07/2020, 01/28/2020    MEDICATIONS/ALLERGIES   Current Meds  Medication Sig   atorvastatin (LIPITOR) 10 MG tablet TAKE 1 TABLET (10 MG TOTAL) BY MOUTH DAILY.   Cholecalciferol (VITAMIN D3 PO) Take by mouth.   clopidogrel (PLAVIX) 75 MG tablet take 1 tablet by mouth daily   Coenzyme Q-10 100 MG capsule Take 100 mg by mouth daily. Reported on 11/10/2015   ferrous sulfate 325 (65 FE) MG tablet TAKE 1 TABLET (325 MG TOTAL) BY MOUTH EVERY OTHER DAY.   hydrochlorothiazide (MICROZIDE) 12.5 MG capsule Take 1 capsule (12.5 mg total) by mouth daily.   lactobacillus acidophilus (BACID) TABS tablet Take 2 tablets by mouth 3 (three) times daily.   metoprolol succinate (TOPROL-XL) 50 MG 24 hr tablet Take 1 tablet (50 mg total) by mouth daily.   Multiple Vitamins-Minerals (MULTIVITAMIN WITH MINERALS) tablet Take 1 tablet by mouth daily.   Omega 3 1200 MG CAPS Take 600 mg by mouth daily.   vitamin B-12 (CYANOCOBALAMIN) 100 MCG tablet Take 100 mcg by mouth daily.   [DISCONTINUED] clopidogrel (PLAVIX) 75 MG tablet Take 75 mg by mouth daily.    Allergies  Allergen Reactions   Lisinopril Cough    SOCIAL HISTORY/FAMILY HISTORY   Reviewed in Epic:   Social History   Tobacco Use   Smoking status: Never   Smokeless tobacco: Never  Vaping Use   Vaping Use: Never used  Substance Use Topics   Alcohol use: No   Drug use: No   Social History   Social History Narrative   Per Neurology c/s note in 2021:    Patient is working from home 75% of the time, and lives in a household with one son . Family status is divorced, with 78 and 57 year old children, 1 granddaughter. The patient currently Pets are not present.   Tobacco use: none   ETOH use : none  Caffeine intake in form of Coffee( rare ) Soda(none ) Tea ( none ) nor energy drinks.   Regular exercise in form of  4 days weekly, at home               Sleep habits are as follows: The patient's dinner time is between 6 PM. The patient goes to bed at 9.30 PM and continues to sleep for 7 hours.   The preferred sleep position is left sided, with the support of 2 pillows. Dreams are reportedly frequent/vivid.    6.30  AM is the usual rise time.  The patient wakes up spontaneously.    She reports  feeling refreshed and  restored in AM, withot symptoms such as dry mouth, morning headaches or residual fatigue. No naps.   Family History  Problem Relation Age of Onset   Hypertension Mother    CAD Father 23   Heart attack Father 12   Stroke Paternal Uncle     OBJCTIVE -PE, EKG, labs   Wt Readings from Last 3 Encounters:  11/01/21 223 lb 8 oz (101.4 kg)  08/13/21 215 lb (97.5 kg)  07/05/21 207 lb 4 oz (94 kg)    Physical Exam: BP 128/84   Pulse 81   Ht '5\' 7"'$  (1.702 m)   Wt 223 lb 8 oz (101.4 kg)   SpO2 97%   BMI 35.01 kg/m  Physical Exam Constitutional:      General: She is not in acute distress.    Appearance: Normal appearance. She is obese. She is not ill-appearing or toxic-appearing.  HENT:     Head: Normocephalic and atraumatic.  Neck:     Vascular: No carotid bruit, hepatojugular reflux or JVD.  Cardiovascular:     Rate and Rhythm: Normal rate and regular rhythm. Occasional Extrasystoles are present.    Chest Wall: PMI is not displaced.     Pulses: Normal pulses.     Heart sounds: S1 normal and S2 normal. No murmur heard.    No friction rub. No gallop.  Pulmonary:     Effort: Pulmonary effort is normal. No respiratory distress.     Breath sounds: Normal breath sounds. No wheezing, rhonchi or rales.  Chest:     Chest wall: No tenderness.  Abdominal:     General: Abdomen is flat. Bowel sounds are normal. There is no distension.     Palpations: Abdomen is soft. There is no mass.     Tenderness: There is no abdominal tenderness.  Musculoskeletal:        General: Swelling (Trivial bilateral ankle swelling) present.  Normal range of motion.     Cervical back: Normal range of motion and neck supple.  Skin:    General: Skin is warm and dry.  Neurological:     General: No focal deficit present.     Mental Status: She is alert and oriented to person, place, and time. Mental status is at baseline.     Gait: Gait normal.  Psychiatric:        Mood and Affect: Mood normal.        Behavior: Behavior normal.        Thought Content: Thought content normal.        Judgment: Judgment normal.      Adult ECG Report  Rate: 81 ;  Rhythm: normal sinus rhythm, premature ventricular contractions (PVC), and otherwise normal axis, intervals and durations. ;   Narrative Interpretation: Borderline EKG, asymptomatic previously.  Recent Labs: Reviewed Lab Results  Component Value Date   CHOL 169 07/05/2021   HDL 75 07/05/2021   LDLCALC 74 07/05/2021   TRIG 115 07/05/2021   CHOLHDL 2.3 07/05/2021   Lab Results  Component Value Date   CREATININE 0.75 07/05/2021   BUN 11 07/05/2021   NA 138 07/05/2021   K 4.4 07/05/2021   CL 102 07/05/2021   CO2 23 07/05/2021      Latest Ref Rng & Units 07/05/2021    4:01 PM 05/25/2020    4:08 PM 02/08/2019   11:59 AM  CBC  WBC 3.4 - 10.8 x10E3/uL  6.1  6.2  4.7   Hemoglobin 11.1 - 15.9 g/dL 10.6  9.6  11.6   Hematocrit 34.0 - 46.6 % 33.7  31.0  35.0   Platelets 150 - 450 x10E3/uL 340  398  362     Lab Results  Component Value Date   HGBA1C 5.6 07/05/2021   Lab Results  Component Value Date   TSH 1.340 08/15/2016    ================================================== I spent a total of 28 minutes with the patient spent in direct patient consultation.  Additional time spent with chart review  / charting (studies, outside notes, etc): 13 min Total Time: 41 min  Current medicines are reviewed at length with the patient today.  (+/- concerns) N/A  Notice: This dictation was prepared with Dragon dictation along with smart phrase technology. Any transcriptional errors  that result from this process are unintentional and may not be corrected upon review.   Studies Ordered:  No orders of the defined types were placed in this encounter.  No orders of the defined types were placed in this encounter.   Patient Instructions / Medication Changes & Studies & Tests Ordered   Patient Instructions  Medication Instructions:   No changes  *If you need a refill on your cardiac medications before your next appointment, please call your pharmacy*   Lab Work: Not needed    Testing/Procedures: Not needed   Follow-Up: At [facility] you and your health needs are our priority.  As part of our continuing mission to provide you with exceptional heart care, we have created designated Provider Care Teams.  These Care Teams include your primary Cardiologist (physician) and Advanced Practice Providers (APPs -  Physician Assistants and Nurse Practitioners) who all work together to provide you with the care you need, when you need it.     Your next appointment:     as needed  The format for your next appointment:   In Person  Provider:   Glenetta Hew, MD    Other Instructions--  Cleared  for surgery - from a cardiac standpoint -  you will need to contact  Neurology for whether or not to discontinue Plavix     Glenetta Hew, M.D., M.S. Interventional Cardiologist   Pager # [phone] Phone # [phone] [address]. [place],  [address]   Thank you for choosing Heartcare at [facility]!!

## 2021-11-01 ENCOUNTER — Ambulatory Visit: Payer: Commercial Managed Care - HMO | Admitting: Cardiology

## 2021-11-01 ENCOUNTER — Encounter: Payer: Self-pay | Admitting: Cardiology

## 2021-11-01 VITALS — BP 128/84 | HR 81 | Ht 67.0 in | Wt 223.5 lb

## 2021-11-01 DIAGNOSIS — Z01810 Encounter for preprocedural cardiovascular examination: Secondary | ICD-10-CM | POA: Insufficient documentation

## 2021-11-01 DIAGNOSIS — I63531 Cerebral infarction due to unspecified occlusion or stenosis of right posterior cerebral artery: Secondary | ICD-10-CM

## 2021-11-01 DIAGNOSIS — R002 Palpitations: Secondary | ICD-10-CM | POA: Diagnosis not present

## 2021-11-01 DIAGNOSIS — Z8673 Personal history of transient ischemic attack (TIA), and cerebral infarction without residual deficits: Secondary | ICD-10-CM

## 2021-11-01 DIAGNOSIS — E782 Mixed hyperlipidemia: Secondary | ICD-10-CM | POA: Diagnosis not present

## 2021-11-01 DIAGNOSIS — I10 Essential (primary) hypertension: Secondary | ICD-10-CM

## 2021-11-01 NOTE — Patient Instructions (Signed)
Medication Instructions:   No changes  *If you need a refill on your cardiac medications before your next appointment, please call your pharmacy*   Lab Work: Not needed    Testing/Procedures: Not needed   Follow-Up: At [facility], you and your health needs are our priority.  As part of our continuing mission to provide you with exceptional heart care, we have created designated Provider Care Teams.  These Care Teams include your primary Cardiologist (physician) and Advanced Practice Providers (APPs -  Physician Assistants and Nurse Practitioners) who all work together to provide you with the care you need, when you need it.     Your next appointment:     as needed  The format for your next appointment:   In Person  Provider:   Glenetta Hew, MD    Other Instructions--  Cleared  for surgery - from a cardiac standpoint -  you will need to contact  Neurology for whether or not to discontinue Plavix

## 2021-11-01 NOTE — Assessment & Plan Note (Signed)
Completely asymptomatic 56 year old woman with no prior cardiac history besides palpitations that are benign.  No need for cardiac evaluation of any sort as she would be a low risk patient for low risk surgery.  She has had a history of stroke that is her only risk factor.  She is nondiabetic, normal renal function, no prior history of CAD or CHF with no symptoms to suggest that they are present.  She is on Plavix with a decision of timing and duration of holding that would be after neurology.  She is safe and cleared to proceed with noncardiac surgery without any further cardiac evaluation.

## 2021-11-01 NOTE — Assessment & Plan Note (Signed)
History of CVA following a TIA both in 2016.  Was placed on Plavix by neurology.  Echocardiogram was relatively normal.  She did not have a monitor until 2018 when she presented with palpitations but there is no signs of A-fib.  We will defer to neurology as far as duration of course of treatment with Plavix.  More than likely they are concerned about the right carotid moderate stenosis as a potential culprit lesion. Regardless she would need to hold Plavix for her surgery and that will be for 5 to 7 days preop and a couple days postop.  Going forward without the fibroids she probably is fine being back on Plavix. The primary care on atorvastatin and omega-3 fatty acids.  Most recent lipid panel has her LDL at 74 which is pretty close to goal less than 70.

## 2021-11-01 NOTE — Assessment & Plan Note (Signed)
History of palpitations from 2016 has not had any issues since then.  PVCs noted on EKG likely benign is very symptomatic.  She is already on Toprol. .  No signs or symptoms of arrhythmia. Would not do any further evaluation at this point

## 2021-11-01 NOTE — Assessment & Plan Note (Signed)
Blood pressures well controlled today HCTZ and Toprol.   Toprol was likely recently started because of the palpitations.  She does have PVCs and this will keep them subdued.  Would not necessarily titrate up further preoperatively.  I will BP is stable.

## 2021-11-10 ENCOUNTER — Ambulatory Visit: Payer: Commercial Managed Care - HMO | Admitting: Adult Health

## 2021-11-10 ENCOUNTER — Telehealth: Payer: Self-pay

## 2021-11-10 ENCOUNTER — Encounter: Payer: Self-pay | Admitting: Adult Health

## 2021-11-10 VITALS — BP 106/71 | HR 83 | Ht 67.0 in | Wt 222.0 lb

## 2021-11-10 DIAGNOSIS — I6523 Occlusion and stenosis of bilateral carotid arteries: Secondary | ICD-10-CM | POA: Diagnosis not present

## 2021-11-10 DIAGNOSIS — I6381 Other cerebral infarction due to occlusion or stenosis of small artery: Secondary | ICD-10-CM

## 2021-11-10 DIAGNOSIS — G459 Transient cerebral ischemic attack, unspecified: Secondary | ICD-10-CM

## 2021-11-10 MED ORDER — ASPIRIN 81 MG PO TBEC: 81.0000 mg | DELAYED_RELEASE_TABLET | Freq: Every day | ORAL | 12 refills | Status: AC

## 2021-11-10 NOTE — Telephone Encounter (Signed)
Returned call, consulted provider and advised pt on med management

## 2021-11-10 NOTE — Patient Instructions (Addendum)
Initiate aspirin 81 mg daily and stop Plavix and continue atorvastatin  for secondary stroke prevention  You will be contacted to complete a carotid ultrasound to evaluate for carotid narrowing  Continue to follow up with PCP regarding cholesterol and blood pressure management  Maintain strict control of hypertension with blood pressure goal below 130/90 and cholesterol with LDL cholesterol (bad cholesterol) goal below 70 mg/dL.   Signs of a Stroke? Follow the BEFAST method:  Balance Watch for a sudden loss of balance, trouble with coordination or vertigo Eyes Is there a sudden loss of vision in one or both eyes? Or double vision?  Face: Ask the person to smile. Does one side of the face droop or is it numb?  Arms: Ask the person to raise both arms. Does one arm drift downward? Is there weakness or numbness of a leg? Speech: Ask the person to repeat a simple phrase. Does the speech sound slurred/strange? Is the person confused ? Time: If you observe any of these signs, call 911.       Thank you for coming to see [place] at [facility]. I hope we have been able to provide you high quality care today.  You may receive a patient satisfaction survey over the next few weeks. We would appreciate your feedback and comments so that we may continue to improve ourselves and the health of our patients.

## 2021-11-10 NOTE — Progress Notes (Signed)
[facility] [address] [place] [address] [phone]       STROKE FOLLOW UP NOTE  [name] Date of Birth:  1966-01-08 Medical Record Number:  [id number]   Reason for Referral: stroke follow up    SUBJECTIVE:   CHIEF COMPLAINT:  Chief Complaint  Patient presents with   Follow-up    RM 3 Alone Pt is well and stable, no new concerns     HPI:    Ruth Conley  is a 56 y.o. female  with PMH of TIA 08/2014 and right thalamic stroke 04/2015.  Previously followed by Dr. Erlinda Conley from 2016 - 2018 then seen by Dr. Brett Conley 05/2019 and lost to follow-up after that time.   She returns today 11/10/2021 requesting clearance to pursue fibroid surgery which is currently scheduled 7/18.  She has been stable since prior visit without any new or reoccurring stroke/TIA symptoms.  Reports compliance on Plavix and atorvastatin. She is concerned that Plavix may be contributing to heavy menstrual cycles. She questions need of continued use or if there are any other options for her. Blood pressure today 106/71.  Routinely follows with PCP Ruth Rankins, NP.  Recent stroke labs 06/2021 A1c 5.6 and LDL 74.  She has no specific concerns or complaints today.        ROS:   14 system review of systems performed and negative with exception of those listed in HPI  PMH:  Past Medical History:  Diagnosis Date   Carotid stenosis    a. 08/2014: dopplers showing 40-59% RICA stenosis and 4-69% LICA stenosis   Hypertension    Stroke ([place]) 04/2015   L facial droop & lipis tingling; no residual findings.   TIA (transient ischemic attack) 08/2014   R arm  / hamd went numb & weak    PSH:  Past Surgical History:  Procedure Laterality Date   CESAREAN SECTION     TRANSTHORACIC ECHOCARDIOGRAM  04/2015   (no change from 08/2014) Normal LV size and thickness. EF 60-65%. GR 1 DD. No wall motion abnormality. No source of embolism noted    Social History:  Social History    Socioeconomic History   Marital status: Single    Spouse name: Not on file   Number of children: Not on file   Years of education: Not on file   Highest education level: Not on file  Occupational History   Not on file  Tobacco Use   Smoking status: Never   Smokeless tobacco: Never  Vaping Use   Vaping Use: Never used  Substance and Sexual Activity   Alcohol use: No   Drug use: No   Sexual activity: Not Currently    Birth control/protection: None  Other Topics Concern   Not on file  Social History Narrative   Per Neurology c/s note in 2021:    Patient is working from home 75% of the time, and lives in a household with one son . Family status is divorced, with 33 and 30 year old children, 1 granddaughter. The patient currently Pets are not present.   Tobacco use: none   ETOH use : none  Caffeine intake in form of Coffee( rare ) Soda(none ) Tea ( none ) nor energy drinks.   Regular exercise in form of  4 days weekly, at home              Sleep habits are as follows: The patient's dinner time is between 6 PM. The patient  goes to bed at 9.30 PM and continues to sleep for 7 hours.   The preferred sleep position is left sided, with the support of 2 pillows. Dreams are reportedly frequent/vivid.    6.30  AM is the usual rise time.    The patient wakes up spontaneously.    She reports  feeling refreshed and  restored in AM, withot symptoms such as dry mouth, morning headaches or residual fatigue. No naps.   Social Determinants of Health   Financial Resource Strain: Not on file  Food Insecurity: Not on file  Transportation Needs: Not on file  Physical Activity: Not on file  Stress: Not on file  Social Connections: Not on file  Intimate Partner Violence: Not on file    Family History:  Family History  Problem Relation Age of Onset   Hypertension Mother    CAD Father 1   Heart attack Father 22   Stroke Paternal Uncle     Medications:   Current Outpatient Medications on  File Prior to Visit  Medication Sig Dispense Refill   atorvastatin (LIPITOR) 10 MG tablet TAKE 1 TABLET (10 MG TOTAL) BY MOUTH DAILY. 90 tablet 2   Cholecalciferol (VITAMIN D3 PO) Take by mouth.     clopidogrel (PLAVIX) 75 MG tablet take 1 tablet by mouth daily 90 tablet 3   Coenzyme Q-10 100 MG capsule Take 100 mg by mouth daily. Reported on 11/10/2015     hydrochlorothiazide (MICROZIDE) 12.5 MG capsule Take 1 capsule (12.5 mg total) by mouth daily. 90 capsule 1   lactobacillus acidophilus (BACID) TABS tablet Take 2 tablets by mouth 3 (three) times daily.     metoprolol succinate (TOPROL-XL) 50 MG 24 hr tablet Take 1 tablet (50 mg total) by mouth daily. 90 tablet 1   Multiple Vitamins-Minerals (MULTIVITAMIN WITH MINERALS) tablet Take 1 tablet by mouth daily.     Omega 3 1200 MG CAPS Take 600 mg by mouth daily.     vitamin B-12 (CYANOCOBALAMIN) 100 MCG tablet Take 100 mcg by mouth daily.     No current facility-administered medications on file prior to visit.    Allergies:   Allergies  Allergen Reactions   Lisinopril Cough      OBJECTIVE:  Physical Exam  Vitals:   11/10/21 1306  BP: 106/71  Pulse: 83  Weight: 222 lb (100.7 kg)  Height: '5\' 7"'$  (1.702 m)   Body mass index is 34.77 kg/m. No results found.   General: well developed, well nourished, very pleasant middle-aged African-American female, seated, in no evident distress Head: head normocephalic and atraumatic.   Neck: supple with no carotid or supraclavicular bruits Cardiovascular: regular rate and rhythm, no murmurs Musculoskeletal: no deformity Skin:  no rash/petichiae Vascular:  Normal pulses all extremities   Neurologic Exam Mental Status: Awake and fully alert.  Fluent speech and language.  Oriented to place and time. Recent and remote memory intact. Attention span, concentration and fund of knowledge appropriate. Mood and affect appropriate.  Cranial Nerves: Pupils equal, briskly reactive to light.  Extraocular movements full without nystagmus. Visual fields full to confrontation. Hearing intact. Facial sensation intact. Face, tongue, palate moves normally and symmetrically.  Motor: Normal bulk and tone. Normal strength in all tested extremity muscles Sensory.: intact to touch , pinprick , position and vibratory sensation.  Coordination: Rapid alternating movements normal in all extremities. Finger-to-nose and heel-to-shin performed accurately bilaterally. Gait and Station: Arises from chair without difficulty. Stance is normal. Gait demonstrates normal stride length and  balance without use of AD. Tandem walk and heel toe without difficulty.  Reflexes: 1+ and symmetric. Toes downgoing.          ASSESSMENT: Ruth Conley is a 56 y.o. year old female with history of TIA in 08/2014 and right thalamic stroke in 04/2015 without residual deficit. Vascular risk factors include HTN, HLD, carotid stenosis, and iron deficiency anemia.  Hypercoagulable labs unremarkable and cardiac monitor negative for A-fib.     PLAN:  Surgical clearance: as stable since 2016 without any reoccurring strokes or stroke type symptoms, cleared to proceed with surgical procedure from a stroke standpoint with holding antiplatelet for 3-5 days prior with small but acceptable risk of stroke while off therapy. Recommend restart immediately after or once stable.  Avoid hypotension in setting of carotid stenosis.  Hx of stroke/TIA :  Due to heavy menstrual cycle concerns, discussed switching Plavix to aspirin 81 mg daily, initially placed on Plavix after 04/2015 stroke as already being on Asprin but patient declines this being the case. Due to hx of stroke, recommend use of antiplatelet lifelong for secondary stroke prevention measures.  Initiate aspirin 81 mg daily and stop Plavix and continue atorvastatin for secondary stroke prevention.   Discussed secondary stroke prevention measures and importance of close PCP  follow up for aggressive stroke risk factor management including BP goal<130/90, and HLD with LDL goal<70  I have gone over the pathophysiology of stroke, warning signs and symptoms, risk factors and their management in some detail with instructions to go to the closest emergency room for symptoms of concern. Carotid stenosis:  Carotid ultrasound 08/2014 right ICA 40 to 59% stenosis, left ICA 1 to 39% stenosis Obtain carotid ultrasound as this has not been repeated since that time    No indication for routine follow-up has closely followed by PCP.  Carotid stenosis stable, request PCP to monitor ongoing, if progressed, will refer to [facility]   CC:  PCP: Gildardo Pounds, NP    I spent 26 minutes of face-to-face and non-face-to-face time with patient.  This included previsit chart review, lab review, study review, order entry, electronic health record documentation, patient education regarding surgical clearance with history of stroke, secondary stroke prevention measures and importance of managing stroke risk factors, carotid stenosis and indication for imaging and answered all other questions to patient satisfaction   Frann Rider, [facility]  [facility] [address] [place], [place] [address]  Phone [phone] Fax [phone] Note: This document was prepared with digital dictation and possible smart phrase technology. Any transcriptional errors that result from this process are unintentional.

## 2021-11-11 ENCOUNTER — Ambulatory Visit: Payer: Commercial Managed Care - HMO | Admitting: Obstetrics and Gynecology

## 2021-11-11 ENCOUNTER — Other Ambulatory Visit: Payer: Self-pay

## 2021-11-11 ENCOUNTER — Encounter: Payer: Self-pay | Admitting: Obstetrics and Gynecology

## 2021-11-11 VITALS — BP 128/72 | HR 88 | Wt 222.0 lb

## 2021-11-11 DIAGNOSIS — Z01818 Encounter for other preprocedural examination: Secondary | ICD-10-CM | POA: Diagnosis not present

## 2021-11-11 DIAGNOSIS — N951 Menopausal and female climacteric states: Secondary | ICD-10-CM | POA: Diagnosis not present

## 2021-11-11 MED ORDER — MISOPROSTOL 100 MCG PO TABS
ORAL_TABLET | ORAL | 0 refills | Status: DC
  Filled 2021-11-11: qty 2, 1d supply, fill #0

## 2021-11-11 NOTE — H&P (View-Only) (Signed)
OB/GYN Pre-Op History and Physical  Ruth Conley is a 56 y.o. [id number] presenting for preoperative appointment prior to her Sonata procedure. She has received clearance from both cardiology and neurology.  She has stopped her plavix and has been converted to baby aspirin.  She will stop the aspirin 48 hours prior to the procedure.Sonata procedure again discussed as well as cervical ripening.      Past Medical History:  Diagnosis Date   Carotid stenosis    a. 08/2014: dopplers showing 40-59% RICA stenosis and 3-97% LICA stenosis   Hypertension    Stroke ([place]) 04/2015   L facial droop & lipis tingling; no residual findings.   TIA (transient ischemic attack) 08/2014   R arm  / hamd went numb & weak    Past Surgical History:  Procedure Laterality Date   CESAREAN SECTION     TRANSTHORACIC ECHOCARDIOGRAM  04/2015   (no change from 08/2014) Normal LV size and thickness. EF 60-65%. GR 1 DD. No wall motion abnormality. No source of embolism noted    OB History  Gravida Para Term Preterm AB Living  '2 2 2     2  '$ SAB IAB Ectopic Multiple Live Births          2    # Outcome Date GA Lbr Len/2nd Weight Sex Delivery Anes PTL Lv  2 Term 1997     VBAC   LIV  1 Term 1988     CS-Unspec   LIV     Birth Comments: preeclampsia    Social History   Socioeconomic History   Marital status: Single    Spouse name: Not on file   Number of children: Not on file   Years of education: Not on file   Highest education level: Not on file  Occupational History   Not on file  Tobacco Use   Smoking status: Never   Smokeless tobacco: Never  Vaping Use   Vaping Use: Never used  Substance and Sexual Activity   Alcohol use: No   Drug use: No   Sexual activity: Not Currently    Birth control/protection: None  Other Topics Concern   Not on file  Social History Narrative   Per Neurology c/s note in 2021:    Patient is working from home 75% of the time, and lives in a household with one son .  Family status is divorced, with 47 and 66 year old children, 1 granddaughter. The patient currently Pets are not present.   Tobacco use: none   ETOH use : none  Caffeine intake in form of Coffee( rare ) Soda(none ) Tea ( none ) nor energy drinks.   Regular exercise in form of  4 days weekly, at home              Sleep habits are as follows: The patient's dinner time is between 6 PM. The patient goes to bed at 9.30 PM and continues to sleep for 7 hours.   The preferred sleep position is left sided, with the support of 2 pillows. Dreams are reportedly frequent/vivid.    6.30  AM is the usual rise time.    The patient wakes up spontaneously.    She reports  feeling refreshed and  restored in AM, withot symptoms such as dry mouth, morning headaches or residual fatigue. No naps.   Social Determinants of Health   Financial Resource Strain: Not on file  Food Insecurity: Not on file  Transportation Needs: Not on  file  Physical Activity: Not on file  Stress: Not on file  Social Connections: Not on file    Family History  Problem Relation Age of Onset   Hypertension Mother    CAD Father 84   Heart attack Father 51   Stroke Paternal Uncle     (Not in a hospital admission)   Allergies  Allergen Reactions   Lisinopril Cough    Review of Systems: Negative except for what is mentioned in HPI.     Physical Exam: BP 128/72   Pulse 88   Wt 222 lb (100.7 kg)   LMP 10/14/2021 (Approximate)   BMI 34.77 kg/m  CONSTITUTIONAL: Well-developed, well-nourished female in no acute distress.  HENT:  Normocephalic, atraumatic, External right and left ear normal. Oropharynx is clear and moist EYES: Conjunctivae and EOM are normal. Pupils are equal, round, and reactive to light. No scleral icterus.  NECK: Normal range of motion, supple, no masses SKIN: Skin is warm and dry. No rash noted. Not diaphoretic. No erythema. No pallor. [place]: Alert and oriented to person, place, and time. Normal  reflexes, muscle tone coordination. No cranial nerve grossly normal. PSYCHIATRIC: Normal mood and affect. Normal behavior. Normal judgment and thought content. CARDIOVASCULAR: Normal heart rate noted, regular rhythm RESPIRATORY: Effort and breath sounds normal, no problems with respiration noted ABDOMEN: Soft, nontender, nondistended. Well-healed Pfannenstiel incision. PELVIC: Cervix easily accessed, no stenosis noted. MUSCULOSKELETAL: Normal range of motion. No edema and no tenderness. 2+ distal pulses.   Pertinent Labs/Studies:   CLINICAL DATA:  Uterine fibroids, menorrhagia   EXAM: TRANSABDOMINAL AND TRANSVAGINAL ULTRASOUND OF PELVIS   DOPPLER ULTRASOUND OF OVARIES   TECHNIQUE: Both transabdominal and transvaginal ultrasound examinations of the pelvis were performed. Transabdominal technique was performed for global imaging of the pelvis including uterus, ovaries, adnexal regions, and pelvic cul-de-sac.   It was necessary to proceed with endovaginal exam following the transabdominal exam to visualize the endometrium and ovaries. Color and duplex Doppler ultrasound was utilized to evaluate blood flow to the ovaries.   COMPARISON:  05/07/2018   FINDINGS: Uterus   Measurements: 12.2 x 8 x 8.2 cm = volume: 420.6 mL. There is inhomogeneous echogenicity in myometrium with multiple fibroids. There is submucosal fibroid measuring 2.5 x 1.5 x 1.7 cm in the anterior aspect. There is 3.4 x 3.7 x 3.7 cm fibroid in the right anterior fundus. There is 4.4 x 3.1 x 3.2 cm fibroid in the left posterior fundus.   Endometrium   Thickness: 10 mm.  No focal abnormality visualized.   Right ovary   Measurements: 3.9 x 1.8 x 2.5 cm = volume: 9.2 mL. No adnexal mass is seen.   Left ovary   Measurements: 4.1 x 1.9 x 2.8 cm = volume: 11.2 mL. No dominant adnexal masses are seen.   Pulsed Doppler evaluation of both ovaries demonstrates normal low-resistance arterial and venous  waveforms.   Other findings   No abnormal free fluid.   IMPRESSION: There are multiple uterine fibroids with possible increase in size. There are no adnexal masses.     Assessment and Plan :Ruth Conley is a 56 y.o. [id number] here for preoperative appointment for Sonata resection and hysteroscopy.   Plan for Sonata procedure followed by Hysteroscopy. NPO Admission labs ordered VS as standard. Hold baby aspirin 48 hours prior to procedure. Cervical ripening the night before procedure.   Ruth Conley, M.D. Attending [place], [facility] for [employer], [place]

## 2021-11-11 NOTE — Progress Notes (Signed)
OB/GYN Pre-Op History and Physical  Ruth Conley is a 56 y.o. [id number] presenting for preoperative appointment prior to her Sonata procedure. She has received clearance from both cardiology and neurology.  She has stopped her plavix and has been converted to baby aspirin.  She will stop the aspirin 48 hours prior to the procedure.Sonata procedure again discussed as well as cervical ripening.      Past Medical History:  Diagnosis Date   Carotid stenosis    a. 08/2014: dopplers showing 40-59% RICA stenosis and 2-95% LICA stenosis   Hypertension    Stroke ([place]) 04/2015   L facial droop & lipis tingling; no residual findings.   TIA (transient ischemic attack) 08/2014   R arm  / hamd went numb & weak    Past Surgical History:  Procedure Laterality Date   CESAREAN SECTION     TRANSTHORACIC ECHOCARDIOGRAM  04/2015   (no change from 08/2014) Normal LV size and thickness. EF 60-65%. GR 1 DD. No wall motion abnormality. No source of embolism noted    OB History  Gravida Para Term Preterm AB Living  '2 2 2     2  '$ SAB IAB Ectopic Multiple Live Births          2    # Outcome Date GA Lbr Len/2nd Weight Sex Delivery Anes PTL Lv  2 Term 1997     VBAC   LIV  1 Term 1988     CS-Unspec   LIV     Birth Comments: preeclampsia    Social History   Socioeconomic History   Marital status: Single    Spouse name: Not on file   Number of children: Not on file   Years of education: Not on file   Highest education level: Not on file  Occupational History   Not on file  Tobacco Use   Smoking status: Never   Smokeless tobacco: Never  Vaping Use   Vaping Use: Never used  Substance and Sexual Activity   Alcohol use: No   Drug use: No   Sexual activity: Not Currently    Birth control/protection: None  Other Topics Concern   Not on file  Social History Narrative   Per Neurology c/s note in 2021:    Patient is working from home 75% of the time, and lives in a household with one son .  Family status is divorced, with 79 and 87 year old children, 1 granddaughter. The patient currently Pets are not present.   Tobacco use: none   ETOH use : none  Caffeine intake in form of Coffee( rare ) Soda(none ) Tea ( none ) nor energy drinks.   Regular exercise in form of  4 days weekly, at home              Sleep habits are as follows: The patient's dinner time is between 6 PM. The patient goes to bed at 9.30 PM and continues to sleep for 7 hours.   The preferred sleep position is left sided, with the support of 2 pillows. Dreams are reportedly frequent/vivid.    6.30  AM is the usual rise time.    The patient wakes up spontaneously.    She reports  feeling refreshed and  restored in AM, withot symptoms such as dry mouth, morning headaches or residual fatigue. No naps.   Social Determinants of Health   Financial Resource Strain: Not on file  Food Insecurity: Not on file  Transportation Needs: Not on  file  Physical Activity: Not on file  Stress: Not on file  Social Connections: Not on file    Family History  Problem Relation Age of Onset   Hypertension Mother    CAD Father 37   Heart attack Father 33   Stroke Paternal Uncle     (Not in a hospital admission)   Allergies  Allergen Reactions   Lisinopril Cough    Review of Systems: Negative except for what is mentioned in HPI.     Physical Exam: BP 128/72   Pulse 88   Wt 222 lb (100.7 kg)   LMP 10/14/2021 (Approximate)   BMI 34.77 kg/m  CONSTITUTIONAL: Well-developed, well-nourished female in no acute distress.  HENT:  Normocephalic, atraumatic, External right and left ear normal. Oropharynx is clear and moist EYES: Conjunctivae and EOM are normal. Pupils are equal, round, and reactive to light. No scleral icterus.  NECK: Normal range of motion, supple, no masses SKIN: Skin is warm and dry. No rash noted. Not diaphoretic. No erythema. No pallor. [place]: Alert and oriented to person, place, and time. Normal  reflexes, muscle tone coordination. No cranial nerve grossly normal. PSYCHIATRIC: Normal mood and affect. Normal behavior. Normal judgment and thought content. CARDIOVASCULAR: Normal heart rate noted, regular rhythm RESPIRATORY: Effort and breath sounds normal, no problems with respiration noted ABDOMEN: Soft, nontender, nondistended. Well-healed Pfannenstiel incision. PELVIC: Cervix easily accessed, no stenosis noted. MUSCULOSKELETAL: Normal range of motion. No edema and no tenderness. 2+ distal pulses.   Pertinent Labs/Studies:   CLINICAL DATA:  Uterine fibroids, menorrhagia   EXAM: TRANSABDOMINAL AND TRANSVAGINAL ULTRASOUND OF PELVIS   DOPPLER ULTRASOUND OF OVARIES   TECHNIQUE: Both transabdominal and transvaginal ultrasound examinations of the pelvis were performed. Transabdominal technique was performed for global imaging of the pelvis including uterus, ovaries, adnexal regions, and pelvic cul-de-sac.   It was necessary to proceed with endovaginal exam following the transabdominal exam to visualize the endometrium and ovaries. Color and duplex Doppler ultrasound was utilized to evaluate blood flow to the ovaries.   COMPARISON:  05/07/2018   FINDINGS: Uterus   Measurements: 12.2 x 8 x 8.2 cm = volume: 420.6 mL. There is inhomogeneous echogenicity in myometrium with multiple fibroids. There is submucosal fibroid measuring 2.5 x 1.5 x 1.7 cm in the anterior aspect. There is 3.4 x 3.7 x 3.7 cm fibroid in the right anterior fundus. There is 4.4 x 3.1 x 3.2 cm fibroid in the left posterior fundus.   Endometrium   Thickness: 10 mm.  No focal abnormality visualized.   Right ovary   Measurements: 3.9 x 1.8 x 2.5 cm = volume: 9.2 mL. No adnexal mass is seen.   Left ovary   Measurements: 4.1 x 1.9 x 2.8 cm = volume: 11.2 mL. No dominant adnexal masses are seen.   Pulsed Doppler evaluation of both ovaries demonstrates normal low-resistance arterial and venous  waveforms.   Other findings   No abnormal free fluid.   IMPRESSION: There are multiple uterine fibroids with possible increase in size. There are no adnexal masses.     Assessment and Plan :Ruth Conley is a 56 y.o. [id number] here for preoperative appointment for Sonata resection and hysteroscopy.   Plan for Sonata procedure followed by Hysteroscopy. NPO Admission labs ordered VS as standard. Hold baby aspirin 48 hours prior to procedure. Cervical ripening the night before procedure.   Lynnda Shields, M.D. Attending [place], [facility] for [employer], [place]

## 2021-11-12 ENCOUNTER — Other Ambulatory Visit: Payer: Self-pay

## 2021-11-12 LAB — ESTRADIOL: Estradiol: 62.2 pg/mL

## 2021-11-12 LAB — FOLLICLE STIMULATING HORMONE: FSH: 32.2 m[IU]/mL

## 2021-11-15 ENCOUNTER — Encounter (HOSPITAL_COMMUNITY): Payer: Self-pay | Admitting: Obstetrics and Gynecology

## 2021-11-15 ENCOUNTER — Other Ambulatory Visit: Payer: Self-pay

## 2021-11-15 NOTE — Pre-Procedure Instructions (Signed)
[facility] [address], [place] [address] Phone: [phone] Fax: [place], [place]. [place]. [place] [address] Phone: [phone] Fax: [phone]  [id number] [place] [address] [facility]@ [id number]: [phone]: [phone]  PCP - ZGildardo Pounds NP Cardiologist - DLeonie Man MD   EKG - 11/01/21 ECHO - 04/06/15  ERAS Protcol - NPO  Anesthesia review: Y hx of cardiac and stroke  Patient verbally denies any shortness of breath, fever, cough and chest pain during phone call   -------------  SDW INSTRUCTIONS given:  Your procedure is scheduled on 11/16/21.  [facility]., and check in at the Admitting office.  Call this number if you have problems the morning of surgery:  [phone]   Remember:  Do not eat or drink after midnight the night before your surgery     Take these medicines the morning of surgery with A SIP OF WATER  metoprolol succinate (TOPROL-XL)  misoprostol (CYTOTEC)   As of today, STOP taking any Aspirin (unless otherwise instructed by your surgeon) Aleve, Naproxen, Ibuprofen, Motrin, Advil, Goody's, BC's, all herbal medications, fish oil, and all vitamins.                      Do not wear jewelry, make up, or nail polish            Do not wear lotions, powders, perfumes/colognes, or deodorant.            Do not shave 48 hours prior to surgery.  Men may shave face and neck.            Do not bring valuables to the hospital.            [facility] or [facility].  Do NOT Smoke (Tobacco/Vaping) 24 hours prior to your procedure If you use a CPAP at night, you may bring all equipment for your overnight stay.   Contacts,  glasses, dentures or bridgework may not be worn into surgery.      For patients admitted to the hospital, discharge time will be determined by your treatment team.   Patients discharged the day of surgery will not be allowed to drive home, and someone needs to stay with them for 24 hours.    Special instructions:   [facility]- Preparing For Surgery  Before surgery, you can play an important role. Because skin is not sterile, your skin needs to be as free of germs as possible. You can reduce the number of germs on your skin by washing with CHG (chlorahexidine gluconate) Soap before surgery.  CHG is an antiseptic cleaner which kills germs and bonds with the skin to continue killing germs even after washing.    Oral Hygiene is also important to reduce your risk of infection.  Remember - BRUSH YOUR TEETH THE MORNING OF SURGERY WITH YOUR REGULAR TOOTHPASTE  Please do not use if you have an allergy to CHG or antibacterial soaps. If your skin becomes reddened/irritated stop using the CHG.  Do not shave (including legs and underarms) for at least 48 hours prior to first CHG shower. It is OK to shave your face.  Please follow these instructions  carefully.   Shower the NIGHT BEFORE SURGERY and the MORNING OF SURGERY with DIAL Soap.   Pat yourself dry with a CLEAN TOWEL.  Wear CLEAN PAJAMAS to bed the night before surgery  Place CLEAN SHEETS on your bed the night of your first shower and DO NOT SLEEP WITH PETS.   Day of Surgery: Please shower morning of surgery  Wear Clean/Comfortable clothing the morning of surgery Do not apply any deodorants/lotions.   Remember to brush your teeth WITH YOUR REGULAR TOOTHPASTE.   Questions were answered. Patient verbalized understanding of instructions.

## 2021-11-16 ENCOUNTER — Ambulatory Visit (HOSPITAL_COMMUNITY): Payer: Commercial Managed Care - HMO | Admitting: Physician Assistant

## 2021-11-16 ENCOUNTER — Ambulatory Visit (HOSPITAL_COMMUNITY)
Admission: RE | Admit: 2021-11-16 | Discharge: 2021-11-16 | Disposition: A | Discharge status: Home / Self Care | Payer: Commercial Managed Care - HMO | Attending: Obstetrics and Gynecology | Admitting: Obstetrics and Gynecology

## 2021-11-16 ENCOUNTER — Other Ambulatory Visit: Payer: Self-pay

## 2021-11-16 ENCOUNTER — Encounter (HOSPITAL_COMMUNITY): Payer: Self-pay | Admitting: Obstetrics and Gynecology

## 2021-11-16 ENCOUNTER — Ambulatory Visit (HOSPITAL_BASED_OUTPATIENT_CLINIC_OR_DEPARTMENT_OTHER): Payer: Commercial Managed Care - HMO | Admitting: Physician Assistant

## 2021-11-16 ENCOUNTER — Encounter (HOSPITAL_COMMUNITY)
Admission: RE | Disposition: A | Discharge status: Home / Self Care | Payer: Self-pay | Source: Home / Self Care | Attending: Obstetrics and Gynecology

## 2021-11-16 DIAGNOSIS — D250 Submucous leiomyoma of uterus: Secondary | ICD-10-CM | POA: Diagnosis not present

## 2021-11-16 DIAGNOSIS — N920 Excessive and frequent menstruation with regular cycle: Secondary | ICD-10-CM | POA: Diagnosis not present

## 2021-11-16 DIAGNOSIS — Z01818 Encounter for other preprocedural examination: Secondary | ICD-10-CM

## 2021-11-16 DIAGNOSIS — Z87891 Personal history of nicotine dependence: Secondary | ICD-10-CM | POA: Diagnosis not present

## 2021-11-16 DIAGNOSIS — D259 Leiomyoma of uterus, unspecified: Secondary | ICD-10-CM

## 2021-11-16 DIAGNOSIS — I10 Essential (primary) hypertension: Secondary | ICD-10-CM

## 2021-11-16 DIAGNOSIS — Z8673 Personal history of transient ischemic attack (TIA), and cerebral infarction without residual deficits: Secondary | ICD-10-CM | POA: Diagnosis not present

## 2021-11-16 HISTORY — DX: Anemia, unspecified: D64.9

## 2021-11-16 HISTORY — PX: DILATATION & CURETTAGE/HYSTEROSCOPY WITH MYOSURE: SHX6511

## 2021-11-16 LAB — BASIC METABOLIC PANEL
Anion gap: 4 — ABNORMAL LOW (ref 5–15)
BUN: 9 mg/dL (ref 6–20)
CO2: 28 mmol/L (ref 22–32)
Calcium: 9.1 mg/dL (ref 8.9–10.3)
Chloride: 108 mmol/L (ref 98–111)
Creatinine, Ser: 0.73 mg/dL (ref 0.44–1.00)
GFR, Estimated: >60 mL/min (ref >60)
Glucose, Bld: 95 mg/dL (ref 70–99)
Potassium: 3.3 mmol/L — ABNORMAL LOW (ref 3.5–5.1)
Sodium: 140 mmol/L (ref 135–145)

## 2021-11-16 LAB — CBC
HCT: 32.4 % — ABNORMAL LOW (ref 36.0–46.0)
Hemoglobin: 10.3 g/dL — ABNORMAL LOW (ref 12.0–15.0)
MCH: 26.2 pg (ref 26.0–34.0)
MCHC: 31.8 g/dL (ref 30.0–36.0)
MCV: 82.4 fL (ref 80.0–100.0)
Platelets: 274 10*3/uL (ref 150–400)
RBC: 3.93 MIL/uL (ref 3.87–5.11)
RDW: 17.2 % — ABNORMAL HIGH (ref 11.5–15.5)
WBC: 4 10*3/uL (ref 4.0–10.5)
nRBC: 0 % (ref 0.0–0.2)

## 2021-11-16 LAB — TYPE AND SCREEN
ABO/RH(D): O POS
Antibody Screen: NEGATIVE

## 2021-11-16 LAB — ABO/RH: ABO/RH(D): O POS

## 2021-11-16 LAB — POCT PREGNANCY, URINE: Preg Test, Ur: NEGATIVE

## 2021-11-16 SURGERY — RADIOFREQUENCY ABLATION, LEIOMYOMA, UTERUS, TRANSCERVICAL APPROACH, WITH US GUIDANCE
Anesthesia: General | Site: Uterus

## 2021-11-16 MED ORDER — STERILE WATER FOR IRRIGATION IR SOLN
Status: DC | PRN
  Administered 2021-11-16: 1000 mL

## 2021-11-16 MED ORDER — LACTATED RINGERS IV SOLN: INTRAVENOUS | Status: DC

## 2021-11-16 MED ORDER — ACETAMINOPHEN 10 MG/ML IV SOLN: 1000.0000 mg | Freq: Once | INTRAVENOUS | Status: DC | PRN

## 2021-11-16 MED ORDER — FENTANYL CITRATE (PF) 100 MCG/2ML IJ SOLN
25.0000 ug | INTRAMUSCULAR | Status: DC | PRN
  Administered 2021-11-16: 50 ug via INTRAVENOUS
  Administered 2021-11-16: 25 ug via INTRAVENOUS

## 2021-11-16 MED ORDER — CEFAZOLIN SODIUM-DEXTROSE 2-4 GM/100ML-% IV SOLN
INTRAVENOUS | Status: AC
  Filled 2021-11-16: qty 100

## 2021-11-16 MED ORDER — ACETAMINOPHEN 160 MG/5ML PO SOLN: 325.0000 mg | ORAL | Status: DC | PRN

## 2021-11-16 MED ORDER — ACETAMINOPHEN 325 MG PO TABS: 325.0000 mg | ORAL_TABLET | ORAL | Status: DC | PRN

## 2021-11-16 MED ORDER — OXYCODONE HCL 5 MG/5ML PO SOLN: 5.0000 mg | Freq: Once | ORAL | Status: DC | PRN

## 2021-11-16 MED ORDER — MIDAZOLAM HCL 5 MG/5ML IJ SOLN
INTRAMUSCULAR | Status: DC | PRN
  Administered 2021-11-16: 2 mg via INTRAVENOUS

## 2021-11-16 MED ORDER — LIDOCAINE 2% (20 MG/ML) 5 ML SYRINGE
INTRAMUSCULAR | Status: AC
  Filled 2021-11-16: qty 5

## 2021-11-16 MED ORDER — DEXAMETHASONE SODIUM PHOSPHATE 10 MG/ML IJ SOLN
INTRAMUSCULAR | Status: DC | PRN
  Administered 2021-11-16: 10 mg via INTRAVENOUS

## 2021-11-16 MED ORDER — PHENYLEPHRINE 80 MCG/ML (10ML) SYRINGE FOR IV PUSH (FOR BLOOD PRESSURE SUPPORT)
PREFILLED_SYRINGE | INTRAVENOUS | Status: DC | PRN
  Administered 2021-11-16: 80 ug via INTRAVENOUS

## 2021-11-16 MED ORDER — SOD CITRATE-CITRIC ACID 500-334 MG/5ML PO SOLN
30.0000 mL | ORAL | Status: AC
  Administered 2021-11-16: 30 mL via ORAL
  Filled 2021-11-16: qty 30

## 2021-11-16 MED ORDER — OXYCODONE HCL 5 MG PO TABS: 5.0000 mg | ORAL_TABLET | Freq: Once | ORAL | Status: DC | PRN

## 2021-11-16 MED ORDER — SODIUM CHLORIDE 0.9 % IR SOLN
Status: DC | PRN
  Administered 2021-11-16: 3000 mL

## 2021-11-16 MED ORDER — PHENYLEPHRINE 80 MCG/ML (10ML) SYRINGE FOR IV PUSH (FOR BLOOD PRESSURE SUPPORT)
PREFILLED_SYRINGE | INTRAVENOUS | Status: AC
  Filled 2021-11-16: qty 10

## 2021-11-16 MED ORDER — CHLORHEXIDINE GLUCONATE 0.12 % MT SOLN
OROMUCOSAL | Status: AC
  Administered 2021-11-16: 15 mL via OROMUCOSAL
  Filled 2021-11-16: qty 15

## 2021-11-16 MED ORDER — PROMETHAZINE HCL 25 MG/ML IJ SOLN: 6.2500 mg | INTRAMUSCULAR | Status: DC | PRN

## 2021-11-16 MED ORDER — ONDANSETRON HCL 4 MG/2ML IJ SOLN
INTRAMUSCULAR | Status: AC
  Filled 2021-11-16: qty 2

## 2021-11-16 MED ORDER — CEFAZOLIN SODIUM-DEXTROSE 2-4 GM/100ML-% IV SOLN
2.0000 g | INTRAVENOUS | Status: AC
  Administered 2021-11-16: 2 g via INTRAVENOUS
  Filled 2021-11-16: qty 100

## 2021-11-16 MED ORDER — PROPOFOL 10 MG/ML IV BOLUS
INTRAVENOUS | Status: AC
  Filled 2021-11-16: qty 20

## 2021-11-16 MED ORDER — MIDAZOLAM HCL 2 MG/2ML IJ SOLN
INTRAMUSCULAR | Status: AC
  Filled 2021-11-16: qty 2

## 2021-11-16 MED ORDER — FENTANYL CITRATE (PF) 100 MCG/2ML IJ SOLN
INTRAMUSCULAR | Status: AC
  Filled 2021-11-16: qty 2

## 2021-11-16 MED ORDER — POVIDONE-IODINE 10 % EX SWAB
2.0000 | Freq: Once | CUTANEOUS | Status: AC
  Administered 2021-11-16: 2 via TOPICAL

## 2021-11-16 MED ORDER — AMISULPRIDE (ANTIEMETIC) 5 MG/2ML IV SOLN
INTRAVENOUS | Status: AC
  Filled 2021-11-16: qty 4

## 2021-11-16 MED ORDER — AMISULPRIDE (ANTIEMETIC) 5 MG/2ML IV SOLN
10.0000 mg | Freq: Once | INTRAVENOUS | Status: AC | PRN
  Administered 2021-11-16: 10 mg via INTRAVENOUS

## 2021-11-16 MED ORDER — PROPOFOL 10 MG/ML IV BOLUS
INTRAVENOUS | Status: DC | PRN
  Administered 2021-11-16: 160 mg via INTRAVENOUS

## 2021-11-16 MED ORDER — ACETAMINOPHEN 500 MG PO TABS: 1000.0000 mg | ORAL_TABLET | ORAL | Status: AC

## 2021-11-16 MED ORDER — LIDOCAINE HCL (PF) 1 % IJ SOLN
INTRAMUSCULAR | Status: AC
  Filled 2021-11-16: qty 30

## 2021-11-16 MED ORDER — FENTANYL CITRATE (PF) 250 MCG/5ML IJ SOLN
INTRAMUSCULAR | Status: DC | PRN
Start: 2021-11-16 — End: 2021-11-16
  Administered 2021-11-16: 50 ug via INTRAVENOUS
  Administered 2021-11-16 (×2): 25 ug via INTRAVENOUS

## 2021-11-16 MED ORDER — PHENYLEPHRINE HCL-NACL 20-0.9 MG/250ML-% IV SOLN
INTRAVENOUS | Status: DC | PRN
  Administered 2021-11-16: 25 ug/min via INTRAVENOUS

## 2021-11-16 MED ORDER — CHLORHEXIDINE GLUCONATE 0.12 % MT SOLN: 15.0000 mL | Freq: Once | OROMUCOSAL | Status: AC

## 2021-11-16 MED ORDER — LIDOCAINE HCL 1 % IJ SOLN
INTRAMUSCULAR | Status: DC | PRN
  Administered 2021-11-16: 4 mL

## 2021-11-16 MED ORDER — FENTANYL CITRATE (PF) 250 MCG/5ML IJ SOLN
INTRAMUSCULAR | Status: AC
  Filled 2021-11-16: qty 5

## 2021-11-16 MED ORDER — ACETAMINOPHEN 500 MG PO TABS
ORAL_TABLET | ORAL | Status: AC
  Administered 2021-11-16: 1000 mg via ORAL
  Filled 2021-11-16: qty 2

## 2021-11-16 MED ORDER — DEXAMETHASONE SODIUM PHOSPHATE 10 MG/ML IJ SOLN
INTRAMUSCULAR | Status: AC
  Filled 2021-11-16: qty 1

## 2021-11-16 MED ORDER — ORAL CARE MOUTH RINSE: 15.0000 mL | Freq: Once | OROMUCOSAL | Status: AC

## 2021-11-16 MED ORDER — ONDANSETRON HCL 4 MG/2ML IJ SOLN
INTRAMUSCULAR | Status: DC | PRN
  Administered 2021-11-16: 4 mg via INTRAVENOUS

## 2021-11-16 MED ORDER — OXYCODONE HCL 5 MG PO TABS
5.0000 mg | ORAL_TABLET | ORAL | 0 refills | Status: DC | PRN
  Filled 2021-11-16: qty 10, 2d supply, fill #0

## 2021-11-16 MED ORDER — LIDOCAINE 2% (20 MG/ML) 5 ML SYRINGE
INTRAMUSCULAR | Status: DC | PRN
  Administered 2021-11-16: 40 mg via INTRAVENOUS

## 2021-11-16 SURGICAL SUPPLY — 24 items
CATH ROBINSON RED A/P 16FR (CATHETERS) ×3 IMPLANT
DEVICE MYOSURE LITE (MISCELLANEOUS) IMPLANT
DEVICE MYOSURE REACH (MISCELLANEOUS) ×1 IMPLANT
ELECT DISPERSIVE SONATA (ELECTRODE) ×6 IMPLANT
GAUZE 4X4 16PLY ~~LOC~~+RFID DBL (SPONGE) ×3 IMPLANT
GLOVE SURG ENC MOIS LTX SZ8 (GLOVE) ×3 IMPLANT
GLOVE SURG UNDER POLY LF SZ7 (GLOVE) ×6 IMPLANT
GOWN STRL REUS W/ TWL LRG LVL3 (GOWN DISPOSABLE) ×4 IMPLANT
GOWN STRL REUS W/TWL LRG LVL3 (GOWN DISPOSABLE) ×4
HANDPIECE RFA SONATA (MISCELLANEOUS) ×3 IMPLANT
KIT PROCEDURE FLUENT (KITS) ×3 IMPLANT
KIT TURNOVER KIT B (KITS) ×3 IMPLANT
MYOSURE XL FIBROID (MISCELLANEOUS)
PACK VAGINAL MINOR WOMEN LF (CUSTOM PROCEDURE TRAY) ×3 IMPLANT
PAD OB MATERNITY 4.3X12.25 (PERSONAL CARE ITEMS) ×3 IMPLANT
PAD PREP 24X48 CUFFED NSTRL (MISCELLANEOUS) ×3 IMPLANT
SEAL ROD LENS SCOPE MYOSURE (ABLATOR) ×3 IMPLANT
SLEEVE SCD COMPRESS KNEE MED (STOCKING) ×3 IMPLANT
SYR 30ML LL (SYRINGE) ×1 IMPLANT
SYSTEM TISS REMOVAL MYOSURE XL (MISCELLANEOUS) IMPLANT
TOWEL GREEN STERILE FF (TOWEL DISPOSABLE) ×6 IMPLANT
TRAY FOLEY W/BAG SLVR 14FR (SET/KITS/TRAYS/PACK) ×3 IMPLANT
UNDERPAD 30X36 HEAVY ABSORB (UNDERPADS AND DIAPERS) ×3 IMPLANT
WATER STERILE IRR 1000ML POUR (IV SOLUTION) ×3 IMPLANT

## 2021-11-16 NOTE — Interval H&P Note (Signed)
History and Physical Interval Note:  11/16/2021 12:38 PM  Ruth Conley  has presented today for surgery, with the diagnosis of Menorrhagia Fibroids.  The various methods of treatment have been discussed with the patient and family. After consideration of risks, benefits and other options for treatment, the patient has consented to  Procedure(s) with comments: Radio Frequency Ablation with Sonata (N/A) - rep will be here DILATATION & CURETTAGE/HYSTEROSCOPY WITH MYOSURE (N/A) - rep will be here as a surgical intervention.  The patient's history has been reviewed, patient examined, no change in status, stable for surgery.  I have reviewed the patient's chart and labs.  Questions were answered to the patient's satisfaction.     Griffin Basil

## 2021-11-16 NOTE — Op Note (Signed)
PREOPERATIVE DIAGNOSIS: Symptomatic uterine fibroids POSTOPERATIVE DIAGNOSIS: The same PROCEDURE: Transcervical Sonata uterine fibroid radiofrequency ablation, Hysteroscopy, myosure resection. SURGEON:  Dr. Lynnda Shields   INDICATIONS: 56 y.o. [id number]  here for scheduled surgery for the aforementioned diagnoses.   Risks of surgery were discussed with the patient including but not limited to: bleeding which may require transfusion; infection which may require antibiotics; injury to uterus or surrounding organs; intrauterine scarring which may impair future fertility; need for additional procedures including laparotomy or laparoscopy; and other postoperative/anesthesia complications. Written informed consent was obtained.    FINDINGS:  A 13 week size uterus.   Fibroid A :2.3 x 1.6 cm Fibroid  B: 2.5 x 1.8 cm Fibroid C: 2.8 x 2.1 cm Fibroid D: 2 x 1.3 cm Fibroid E: 2.4 x 1.7 cm Fibroid F: 2.4 x 1.7 cm ANESTHESIA:   General, paracervical block with 30 ml of 0.5% Marcaine INTRAVENOUS FLUIDS:  500 ml of LR FLUID DEFICITS:  300 ml of NS ESTIMATED BLOOD LOSS:  Less than 30 ml SPECIMENS: submucosal fibroid COMPLICATIONS:  None immediate.  PROCEDURE DETAILS:  After administering anesthesia, the patient was identified and a time out was performed and procedure was confirmed.  The patient was placed in dorsal lithotomy position and an exam under anesthesia revealed a mobile fibroid uterus and normal adnexa.  A speculum was placed, cervix was grasped with a tenaculum and sounded to a depth of 11 centimeters.  The cervix was easily dilated to a #27 Pratt dilator.   The Sonata handpiece was then inserted and a complete uterine survey was performed with the ultrasound.  Sonata fibroid ablation was then performed.  Fibroid A: Mid, posterior, fundal:ablation 1:48 Fibroid B: mid, posterior: ablation  2:12 Fibroid C: right, lateral fundal: ablation  2:42 Fibroid D: mid, anterior , fundal:  ablation   1:00 Fibroid E: mid, anterior, fundal:  ablation  1:54 Fibroid F: left, lateral , mid :  1:54  All cycles were carried out under direct ultrasound intrauterine guidance and visualization with the ablation guide noted to be within the serosa at all times.  The fibroids appeared ablated with u/s guidance and outgassing noted by U/S appearance. (Hysteroscopy was performed which demonstrated complete ablation on the submucosal endometrial surface where the fibroids were located.  Approximately 1/3 of the single submucosal fibroid was resected with the [facility] device.  Sponge, needle, and instrument account was correct x 2.  All instruments were removed from the vagina.  Hemostasis was assured.  The patient was then awakened and taken to the recovery room I stable condition, tolerating the procedure well.  The patient will be discharged to home as per PACU criteria.  Routine postoperative instructions given.  She was prescribed Ibuprofen.  She will follow up in the clinic in 2-3 weeks  for postoperative evaluation.   Lynnda Shields, MD, [place] for [facility], [place]

## 2021-11-16 NOTE — Progress Notes (Signed)
25 mcg IV fentanyl wasted in stericycle with Danton Clap RN after patient discharge.

## 2021-11-16 NOTE — Anesthesia Procedure Notes (Signed)
Procedure Name: LMA Insertion Date/Time: 11/16/2021 12:56 PM  Performed by: Vonna Drafts, CRNAPre-anesthesia Checklist: Patient identified, Emergency Drugs available, Suction available, Patient being monitored and Timeout performed Patient Re-evaluated:Patient Re-evaluated prior to induction Oxygen Delivery Method: Circle system utilized Preoxygenation: Pre-oxygenation with 100% oxygen Induction Type: IV induction LMA: LMA inserted LMA Size: 4.0 Number of attempts: 1 Tube secured with: Tape Dental Injury: Teeth and Oropharynx as per pre-operative assessment

## 2021-11-16 NOTE — Transfer of Care (Addendum)
Immediate Anesthesia Transfer of Care Note  Patient: Ruth Conley  Procedure(s) Performed: RADIO FREQUENCY ABLATION WITH SONATA (Uterus) DILATATION & CURETTAGE/HYSTEROSCOPY WITH MYOSURE (Uterus)  Patient Location: PACU  Anesthesia Type:General  Level of Consciousness: drowsy  Airway & Oxygen Therapy: Patient Spontanous Breathing and Patient connected to face mask oxygen  Post-op Assessment: Report given to RN and Post -op Vital signs reviewed and stable  Post vital signs: Reviewed and stable  Last Vitals:  Vitals Value Taken Time  BP    Temp    Pulse 80 11/16/21 1431  Resp 20 11/16/21 1431  SpO2 100 % 11/16/21 1431  Vitals shown include unvalidated device data.  Last Pain:  Vitals:   11/16/21 1017  TempSrc: Oral         Complications: No notable events documented.

## 2021-11-16 NOTE — Anesthesia Preprocedure Evaluation (Addendum)
Anesthesia Evaluation  Patient identified by MRN, date of birth, ID band Patient awake    Reviewed: Allergy & Precautions, NPO status , Patient's Chart, lab work & pertinent test results  Airway Mallampati: II  TM Distance: >3 FB Neck ROM: Full    Dental  (+) Teeth Intact, Dental Advisory Given   Pulmonary neg pulmonary ROS,    breath sounds clear to auscultation       Cardiovascular hypertension,  Rhythm:Regular Rate:Normal     Neuro/Psych TIACVA negative psych ROS   GI/Hepatic negative GI ROS, Neg liver ROS,   Endo/Other  negative endocrine ROS  Renal/GU negative Renal ROS     Musculoskeletal negative musculoskeletal ROS (+)   Abdominal Normal abdominal exam  (+)   Peds  Hematology negative hematology ROS (+)   Anesthesia Other Findings   Reproductive/Obstetrics                            Anesthesia Physical Anesthesia Plan  ASA: 3  Anesthesia Plan: General   Post-op Pain Management:    Induction: Intravenous  PONV Risk Score and Plan: 4 or greater and Ondansetron, Dexamethasone, Midazolam and Scopolamine patch - Pre-op  Airway Management Planned: LMA  Additional Equipment: None  Intra-op Plan:   Post-operative Plan: Extubation in OR  Informed Consent: I have reviewed the patients History and Physical, chart, labs and discussed the procedure including the risks, benefits and alternatives for the proposed anesthesia with the patient or authorized representative who has indicated his/her understanding and acceptance.     Dental advisory given  Plan Discussed with: CRNA  Anesthesia Plan Comments:        Anesthesia Quick Evaluation

## 2021-11-16 NOTE — Anesthesia Postprocedure Evaluation (Signed)
Anesthesia Post Note  Patient: Ruth Conley  Procedure(s) Performed: RADIO FREQUENCY ABLATION WITH SONATA (Uterus) DILATATION & CURETTAGE/HYSTEROSCOPY WITH MYOSURE (Uterus)     Patient location during evaluation: PACU Anesthesia Type: General Level of consciousness: awake and alert Pain management: pain level controlled Vital Signs Assessment: post-procedure vital signs reviewed and stable Respiratory status: spontaneous breathing, nonlabored ventilation, respiratory function stable and patient connected to nasal cannula oxygen Cardiovascular status: blood pressure returned to baseline and stable Postop Assessment: no apparent nausea or vomiting Anesthetic complications: no   No notable events documented.  Last Vitals:  Vitals:   11/16/21 1500 11/16/21 1515  BP: 126/82 126/89  Pulse: 80 71  Resp: 15 14  Temp:  36.6 C  SpO2: 93% 93%    Last Pain:  Vitals:   11/16/21 1457  TempSrc:   PainSc: [place] Drue Camera

## 2021-11-17 ENCOUNTER — Encounter (HOSPITAL_COMMUNITY): Payer: Self-pay | Admitting: Obstetrics and Gynecology

## 2021-11-18 LAB — SURGICAL PATHOLOGY

## 2021-11-24 ENCOUNTER — Ambulatory Visit (HOSPITAL_COMMUNITY)
Admission: RE | Admit: 2021-11-24 | Discharge: 2021-11-24 | Disposition: A | Discharge status: Home / Self Care | Payer: Commercial Managed Care - HMO | Source: Ambulatory Visit | Attending: Adult Health | Admitting: Adult Health

## 2021-11-24 DIAGNOSIS — I6523 Occlusion and stenosis of bilateral carotid arteries: Secondary | ICD-10-CM

## 2021-11-24 NOTE — Progress Notes (Signed)
Bilateral carotid artery duplex study completed. Please see CV Proc for preliminary results.  Douglass Dunshee BS, RVT 11/24/2021 9:28 AM

## 2021-12-06 ENCOUNTER — Telehealth: Payer: Self-pay | Admitting: Nurse Practitioner

## 2021-12-06 NOTE — Telephone Encounter (Signed)
Patient given phone number to billing, number that is listed on the [facility] financial packet. [phone]

## 2021-12-06 NOTE — Telephone Encounter (Signed)
Pt is calling to request financial assistance for fibroid surgery that she had on 11/16/21 Please advise CB- [phone]

## 2021-12-16 ENCOUNTER — Ambulatory Visit (INDEPENDENT_AMBULATORY_CARE_PROVIDER_SITE_OTHER): Payer: Commercial Managed Care - HMO | Admitting: Obstetrics and Gynecology

## 2021-12-16 VITALS — BP 152/89 | HR 74 | Wt 215.0 lb

## 2021-12-16 DIAGNOSIS — Z4889 Encounter for other specified surgical aftercare: Secondary | ICD-10-CM | POA: Diagnosis not present

## 2021-12-16 NOTE — Progress Notes (Signed)
    Subjective:    Ruth Conley is a 56 y.o. female who presents to the clinic status post Sonata fibroid ablation on 11/16/21. The patient is not having any pain.  Eating a regular diet without difficulty. Bowel movements are normal. No other significant postoperative concerns.  Pt notes first menses s/p procedure was much improved.  The following portions of the patient's history were reviewed and updated as appropriate: allergies, current medications, past family history, past medical history, past social history, past surgical history, and problem list..  Last pap smear was negative on 08/13/21.  Review of Systems Pertinent items are noted in HPI.   Objective:   BP (!) 152/89   Pulse 74   Wt 215 lb (97.5 kg)   LMP 11/02/2021   BMI 33.67 kg/m  Constitutional:  Well-developed, well-nourished female in no acute distress.   Skin: Skin is warm and dry, no rash noted, not diaphoretic,no erythema, no pallor.  Cardiovascular: Normal heart rate noted  Respiratory: Effort and breath sounds normal, no problems with respiration noted  Abdomen: Soft, bowel sounds active, non-tender, no abnormal masses  Incision: Healing well, no drainage, no erythema, no hernia, no seroma, no swelling, no dehiscence, incision well approximated  Pelvic:    Normal vagina and cervix   Surgical pathology () Fragments of leiomyoma and benign endometrium Assessment:   Doing well postoperatively.  Operative findings again reviewed. Pathology report discussed.   Plan:   1. Continue any current medications. 2. Wound care discussed. 3. Activity restrictions: none 4. Anticipated return to work: now. 5. Follow up as needed 6.  Routine preventative health maintenance measures emphasized. Please refer to After Visit Summary for other counseling recommendations.   Follow up in 6 months to discuss current symptoms  Lynnda Shields, MD, [place] Attending [place] for [facility],  [place]

## 2021-12-29 ENCOUNTER — Other Ambulatory Visit: Payer: Self-pay

## 2022-01-12 ENCOUNTER — Other Ambulatory Visit: Payer: Self-pay | Admitting: Nurse Practitioner

## 2022-01-12 ENCOUNTER — Other Ambulatory Visit: Payer: Self-pay

## 2022-01-12 DIAGNOSIS — I10 Essential (primary) hypertension: Secondary | ICD-10-CM

## 2022-01-12 MED ORDER — METOPROLOL SUCCINATE ER 50 MG PO TB24
50.0000 mg | ORAL_TABLET | Freq: Every day | ORAL | 0 refills | Status: DC
  Filled 2022-01-12: qty 30, 30d supply, fill #0

## 2022-01-13 ENCOUNTER — Other Ambulatory Visit: Payer: Self-pay

## 2022-01-28 ENCOUNTER — Other Ambulatory Visit (HOSPITAL_COMMUNITY): Payer: Self-pay

## 2022-01-28 ENCOUNTER — Other Ambulatory Visit: Payer: Self-pay | Admitting: Family Medicine

## 2022-01-28 DIAGNOSIS — I10 Essential (primary) hypertension: Secondary | ICD-10-CM

## 2022-01-28 MED ORDER — METOPROLOL SUCCINATE ER 50 MG PO TB24
50.0000 mg | ORAL_TABLET | Freq: Every day | ORAL | 2 refills | Status: DC
  Filled 2022-01-28 – 2022-02-11 (×4): qty 30, 30d supply, fill #0
  Filled 2022-02-28 – 2022-03-10 (×2): qty 30, 30d supply, fill #1
  Filled 2022-04-07: qty 30, 30d supply, fill #2

## 2022-01-28 NOTE — Telephone Encounter (Signed)
Requested Prescriptions  Pending Prescriptions Disp Refills  . metoprolol succinate (TOPROL-XL) 50 MG 24 hr tablet 30 tablet 2    Sig: Take 1 tablet (50 mg total) by mouth daily.     Cardiovascular:  Beta Blockers Failed - 01/28/2022 11:12 AM      Failed - Last BP in normal range    BP Readings from Last 1 Encounters:  12/16/21 (!) 152/89         Failed - Valid encounter within last 6 months    Recent Outpatient Visits          6 months ago Annual physical exam   [place], Vernia Buff, NP   1 year ago Annual physical exam   [place], Vernia Buff, NP   2 years ago Encounter for annual physical exam   [place], [place] W, NP   3 years ago Cerebrovascular accident (CVA) due to occlusion of right posterior cerebral artery [facility])   [place], Zelda W, NP   3 years ago Well woman exam without gynecological exam   [place], [place] W, NP             Passed - Last Heart Rate in normal range    Pulse Readings from Last 1 Encounters:  12/16/21 74

## 2022-01-31 ENCOUNTER — Other Ambulatory Visit (HOSPITAL_COMMUNITY): Payer: Self-pay

## 2022-01-31 ENCOUNTER — Other Ambulatory Visit: Payer: Self-pay

## 2022-02-11 ENCOUNTER — Other Ambulatory Visit: Payer: Self-pay

## 2022-02-28 ENCOUNTER — Other Ambulatory Visit: Payer: Self-pay

## 2022-03-01 ENCOUNTER — Other Ambulatory Visit: Payer: Self-pay

## 2022-03-04 ENCOUNTER — Other Ambulatory Visit: Payer: Self-pay

## 2022-03-11 ENCOUNTER — Other Ambulatory Visit: Payer: Self-pay

## 2022-04-08 ENCOUNTER — Other Ambulatory Visit: Payer: Self-pay

## 2022-05-06 ENCOUNTER — Other Ambulatory Visit: Payer: Self-pay

## 2022-05-06 ENCOUNTER — Other Ambulatory Visit: Payer: Self-pay | Admitting: Nurse Practitioner

## 2022-05-06 DIAGNOSIS — I10 Essential (primary) hypertension: Secondary | ICD-10-CM

## 2022-05-06 MED ORDER — METOPROLOL SUCCINATE ER 50 MG PO TB24
50.0000 mg | ORAL_TABLET | Freq: Every day | ORAL | 1 refills | Status: DC
  Filled 2022-05-06: qty 30, 30d supply, fill #0
  Filled 2022-06-05: qty 30, 30d supply, fill #1

## 2022-05-06 NOTE — Telephone Encounter (Signed)
Called pt - Made follow up appt.

## 2022-05-06 NOTE — Telephone Encounter (Signed)
Requested Prescriptions  Pending Prescriptions Disp Refills   metoprolol succinate (TOPROL-XL) 50 MG 24 hr tablet 30 tablet 1    Sig: Take 1 tablet (50 mg total) by mouth daily.     Cardiovascular:  Beta Blockers Failed - 05/06/2022 11:40 AM      Failed - Last BP in normal range    BP Readings from Last 1 Encounters:  12/16/21 (!) 152/89         Failed - Valid encounter within last 6 months    Recent Outpatient Visits           10 months ago Annual physical exam   [place], Vernia Buff, NP   1 year ago Annual physical exam   [place], Vernia Buff, NP   3 years ago Encounter for annual physical exam   [place], [place] W, NP   3 years ago Cerebrovascular accident (CVA) due to occlusion of right posterior cerebral artery [facility])   [place], Zelda W, NP   4 years ago Well woman exam without gynecological exam   [place], Zelda W, NP       Future Appointments             In 1 month Gildardo Pounds, NP [place] in normal range    Pulse Readings from Last 1 Encounters:  12/16/21 74

## 2022-05-09 ENCOUNTER — Other Ambulatory Visit: Payer: Self-pay

## 2022-06-08 ENCOUNTER — Other Ambulatory Visit: Payer: Self-pay

## 2022-07-01 ENCOUNTER — Other Ambulatory Visit: Payer: Self-pay | Admitting: Nurse Practitioner

## 2022-07-01 ENCOUNTER — Other Ambulatory Visit: Payer: Self-pay

## 2022-07-01 DIAGNOSIS — E782 Mixed hyperlipidemia: Secondary | ICD-10-CM

## 2022-07-01 DIAGNOSIS — I10 Essential (primary) hypertension: Secondary | ICD-10-CM

## 2022-07-01 DIAGNOSIS — I63531 Cerebral infarction due to unspecified occlusion or stenosis of right posterior cerebral artery: Secondary | ICD-10-CM

## 2022-07-01 MED ORDER — ATORVASTATIN CALCIUM 10 MG PO TABS
ORAL_TABLET | Freq: Every day | ORAL | 0 refills | Status: DC
  Filled 2022-07-01: qty 30, 30d supply, fill #0

## 2022-07-01 MED ORDER — METOPROLOL SUCCINATE ER 50 MG PO TB24
50.0000 mg | ORAL_TABLET | Freq: Every day | ORAL | 0 refills | Status: DC
  Filled 2022-07-01: qty 30, 30d supply, fill #0

## 2022-07-04 ENCOUNTER — Ambulatory Visit: Payer: Commercial Managed Care - HMO | Admitting: Nurse Practitioner

## 2022-07-05 ENCOUNTER — Other Ambulatory Visit: Payer: Self-pay

## 2022-07-08 ENCOUNTER — Other Ambulatory Visit: Payer: Self-pay

## 2022-08-04 ENCOUNTER — Other Ambulatory Visit: Payer: Self-pay

## 2022-08-04 ENCOUNTER — Ambulatory Visit: Payer: 59 | Attending: Nurse Practitioner | Admitting: Nurse Practitioner

## 2022-08-04 ENCOUNTER — Encounter: Payer: Self-pay | Admitting: Nurse Practitioner

## 2022-08-04 VITALS — BP 138/85 | HR 74 | Ht 67.0 in | Wt 213.8 lb

## 2022-08-04 DIAGNOSIS — E559 Vitamin D deficiency, unspecified: Secondary | ICD-10-CM | POA: Diagnosis not present

## 2022-08-04 DIAGNOSIS — E7800 Pure hypercholesterolemia, unspecified: Secondary | ICD-10-CM

## 2022-08-04 DIAGNOSIS — Z862 Personal history of diseases of the blood and blood-forming organs and certain disorders involving the immune mechanism: Secondary | ICD-10-CM

## 2022-08-04 DIAGNOSIS — I63531 Cerebral infarction due to unspecified occlusion or stenosis of right posterior cerebral artery: Secondary | ICD-10-CM | POA: Diagnosis not present

## 2022-08-04 DIAGNOSIS — I10 Essential (primary) hypertension: Secondary | ICD-10-CM

## 2022-08-04 DIAGNOSIS — R7303 Prediabetes: Secondary | ICD-10-CM | POA: Diagnosis not present

## 2022-08-04 DIAGNOSIS — L659 Nonscarring hair loss, unspecified: Secondary | ICD-10-CM | POA: Diagnosis not present

## 2022-08-04 MED ORDER — METOPROLOL SUCCINATE ER 50 MG PO TB24
50.0000 mg | ORAL_TABLET | Freq: Every day | ORAL | 3 refills | Status: DC
  Filled 2022-08-04: qty 30, 30d supply, fill #0
  Filled 2022-09-01: qty 30, 30d supply, fill #1
  Filled 2022-10-09: qty 30, 30d supply, fill #2
  Filled 2022-11-08: qty 30, 30d supply, fill #3
  Filled 2022-12-06 (×2): qty 30, 30d supply, fill #4
  Filled 2023-01-05: qty 30, 30d supply, fill #5
  Filled 2023-02-22: qty 30, 30d supply, fill #6
  Filled 2023-03-26: qty 30, 30d supply, fill #7
  Filled 2023-04-21: qty 30, 30d supply, fill #8
  Filled 2023-06-06: qty 30, 30d supply, fill #9
  Filled 2023-07-07: qty 30, 30d supply, fill #10

## 2022-08-04 MED ORDER — ATORVASTATIN CALCIUM 10 MG PO TABS
10.0000 mg | ORAL_TABLET | Freq: Every day | ORAL | 3 refills | Status: DC
  Filled 2022-08-04: qty 30, 30d supply, fill #0
  Filled 2022-08-29 – 2022-09-05 (×2): qty 30, 30d supply, fill #1
  Filled 2022-10-09: qty 30, 30d supply, fill #2
  Filled 2022-11-08: qty 30, 30d supply, fill #3
  Filled 2022-12-06 (×2): qty 30, 30d supply, fill #4
  Filled 2023-01-05: qty 30, 30d supply, fill #5
  Filled 2023-02-22: qty 30, 30d supply, fill #6
  Filled 2023-03-26: qty 30, 30d supply, fill #7
  Filled 2023-04-21: qty 30, 30d supply, fill #8
  Filled 2023-06-06 – 2023-07-07 (×2): qty 30, 30d supply, fill #9

## 2022-08-04 NOTE — Progress Notes (Signed)
Assessment & Plan:  Ruth Conley was seen today for hypertension.  Diagnoses and all orders for this visit:  Essential hypertension Continue all antihypertensives as prescribed.  Reminded to bring in blood pressure log for follow  up appointment.  RECOMMENDATIONS: DASH/Mediterranean Diets are healthier choices for HTN.   -     CMP14+EGFR -     metoprolol succinate (TOPROL-XL) 50 MG 24 hr tablet; Take 1 tablet (50 mg total) by mouth daily.  Prediabetes -     Hemoglobin A1c Continue blood sugar control as discussed in office today, low carbohydrate diet, and regular physical exercise as tolerated, 150 minutes per week (30 min each day, 5 days per week, or 50 min 3 days per week).   Hair loss -     Thyroid Panel With TSH -     CMP14+EGFR  History of anemia -     CBC with Differential  Vitamin D deficiency disease -     VITAMIN D 25 Hydroxy (Vit-D Deficiency, Fractures)  Hypercholesterolemia -     atorvastatin (LIPITOR) 10 MG tablet; Take 1 tablet (10 mg total) by mouth daily. -     Lipid panel INSTRUCTIONS: Work on a low fat, heart healthy diet and participate in regular aerobic exercise program by working out at least 150 minutes per week; 5 days a week-30 minutes per day. Avoid red meat/beef/steak,  fried foods. junk foods, sodas, sugary drinks, unhealthy snacking, alcohol and smoking.  Drink at least 80 oz of water per day and monitor your carbohydrate intake daily.    Cerebrovascular accident (CVA) due to occlusion of right posterior cerebral artery -     atorvastatin (LIPITOR) 10 MG tablet; Take 1 tablet (10 mg total) by mouth daily.    Patient has been counseled on age-appropriate routine health concerns for screening and prevention. These are reviewed and up-to-date. Referrals have been placed accordingly. Immunizations are up-to-date or declined.    Subjective:   Chief Complaint  Patient presents with   Hypertension   Hypertension Pertinent negatives include no  blurred vision, chest pain, headaches, malaise/fatigue, palpitations or shortness of breath.   Ruth Conley 57 y.o. female presents to office today for follow up to HTN Doing well today. Has been using [employer] [facility] Ruth's liquid vitamins and has noticed significant improvement in energy levels.   Requesting thyroid level be checked due to hair loss and hair shedding.  HTN Blood pressure not quite at goal. HCTZ was stopped by Neurology recently. We will need to keep close eye on BP and she does monitor this at home as well. We may need to resume in the near future.  BP Readings from Last 3 Encounters:  08/04/22 138/85  12/16/21 (!) 152/89  11/16/21 126/89    Prediabetes Well controlled at this time Lab Results  Component Value Date   HGBA1C 5.6 07/05/2021    Review of Systems  Constitutional:  Negative for fever, malaise/fatigue and weight loss.  HENT: Negative.  Negative for nosebleeds.   Eyes: Negative.  Negative for blurred vision, double vision and photophobia.  Respiratory: Negative.  Negative for cough and shortness of breath.   Cardiovascular: Negative.  Negative for chest pain, palpitations and leg swelling.  Gastrointestinal: Negative.  Negative for heartburn, nausea and vomiting.  Musculoskeletal: Negative.  Negative for myalgias.  Neurological: Negative.  Negative for dizziness, focal weakness, seizures and headaches.  Psychiatric/Behavioral: Negative.  Negative for suicidal ideas.     Past Medical History:  Diagnosis Date   Anemia  Carotid stenosis    a. 08/2014: dopplers showing 40-59% RICA stenosis and 123456 LICA stenosis   Hypertension    Stroke 04/2015   L facial droop & lipis tingling; no residual findings.   TIA (transient ischemic attack) 08/2014   R arm  / hamd went numb & weak    Past Surgical History:  Procedure Laterality Date   CESAREAN SECTION     DILATATION & CURETTAGE/HYSTEROSCOPY WITH MYOSURE N/A 11/16/2021   Procedure: [place];  Surgeon: Griffin Basil, MD;  Location: [place];  Service: Gynecology;  Laterality: N/A;   TRANSTHORACIC ECHOCARDIOGRAM  04/2015   (no change from 08/2014) Normal LV size and thickness. EF 60-65%. GR 1 DD. No wall motion abnormality. No source of embolism noted    Family History  Problem Relation Age of Onset   Hypertension Mother    CAD Father 2   Heart attack Father 33   Stroke Paternal Uncle     Social History Reviewed with no changes to be made today.   Outpatient Medications Prior to Visit  Medication Sig Dispense Refill   aspirin EC 81 MG tablet Take 1 tablet (81 mg total) by mouth daily. Swallow whole. 30 tablet 12   Cholecalciferol (VITAMIN D3 PO) Take 1 tablet by mouth daily.     Multiple Vitamins-Minerals (MULTIVITAMIN WITH MINERALS) tablet Take 1 tablet by mouth daily.     Omega-3 Fatty Acids (OMEGA 3 PO) Take 600 mg by mouth daily.     Probiotic Product (PROBIOTIC DAILY PO) Take 1 capsule by mouth daily.     vitamin B-12 (CYANOCOBALAMIN) 1000 MCG tablet Take 1,000 mcg by mouth daily.     vitamin C (ASCORBIC ACID) 500 MG tablet Take 500 mg by mouth daily.     atorvastatin (LIPITOR) 10 MG tablet TAKE 1 TABLET (10 MG TOTAL) BY MOUTH DAILY. 30 tablet 0   metoprolol succinate (TOPROL-XL) 50 MG 24 hr tablet Take 1 tablet (50 mg total) by mouth daily. 30 tablet 0   hydrochlorothiazide (MICROZIDE) 12.5 MG capsule Take 1 capsule (12.5 mg total) by mouth daily. 90 capsule 1   oxyCODONE (ROXICODONE) 5 MG immediate release tablet Take 1 tablet (5 mg total) by mouth every 4 (four) hours as needed for severe pain. (Patient not taking: Reported on 08/04/2022) 10 tablet 0   No facility-administered medications prior to visit.    Allergies  Allergen Reactions   Lisinopril Cough       Objective:    BP 138/85 (BP Location: Left Arm, Patient Position: Sitting, Cuff Size: Large)   Pulse 74   Ht 5\' 7"  (1.702 m)   Wt 213 lb 12.8 oz (97 kg)   LMP  07/01/2022 (Approximate)   SpO2 98%   BMI 33.49 kg/m  Wt Readings from Last 3 Encounters:  08/04/22 213 lb 12.8 oz (97 kg)  12/16/21 215 lb (97.5 kg)  11/16/21 222 lb (100.7 kg)    Physical Exam Vitals and nursing note reviewed.  Constitutional:      Appearance: She is well-developed.  HENT:     Head: Normocephalic and atraumatic.  Cardiovascular:     Rate and Rhythm: Normal rate and regular rhythm.     Heart sounds: Normal heart sounds. No murmur heard.    No friction rub. No gallop.  Pulmonary:     Effort: Pulmonary effort is normal. No tachypnea or respiratory distress.     Breath sounds: Normal breath sounds. No decreased breath sounds, wheezing,  rhonchi or rales.  Chest:     Chest wall: No tenderness.  Abdominal:     General: Bowel sounds are normal.     Palpations: Abdomen is soft.  Musculoskeletal:        General: Normal range of motion.     Cervical back: Normal range of motion.  Skin:    General: Skin is warm and dry.  Neurological:     Mental Status: She is alert and oriented to person, place, and time.     Coordination: Coordination normal.  Psychiatric:        Behavior: Behavior normal. Behavior is cooperative.        Thought Content: Thought content normal.        Judgment: Judgment normal.          Patient has been counseled extensively about nutrition and exercise as well as the importance of adherence with medications and regular follow-up. The patient was given clear instructions to go to ER or return to medical center if symptoms don't improve, worsen or new problems develop. The patient verbalized understanding.   Follow-up: Return in about 3 months (around 11/03/2022) for 3-4 months physical and BP.   Gildardo Pounds, FNP-BC [facility] and [place], [place]   08/04/2022, 3:01 PM

## 2022-08-05 LAB — CBC WITH DIFFERENTIAL/PLATELET
Basophils Absolute: 0 10*3/uL (ref 0.0–0.2)
Basos: 1 %
EOS (ABSOLUTE): 0.1 10*3/uL (ref 0.0–0.4)
Eos: 2 %
Hematocrit: 37.2 % (ref 34.0–46.6)
Hemoglobin: 11.8 g/dL (ref 11.1–15.9)
Immature Grans (Abs): 0 10*3/uL (ref 0.0–0.1)
Immature Granulocytes: 0 %
Lymphocytes Absolute: 1.3 10*3/uL (ref 0.7–3.1)
Lymphs: 23 %
MCH: 26.2 pg — ABNORMAL LOW (ref 26.6–33.0)
MCHC: 31.7 g/dL (ref 31.5–35.7)
MCV: 83 fL (ref 79–97)
Monocytes Absolute: 0.5 10*3/uL (ref 0.1–0.9)
Monocytes: 8 %
Neutrophils Absolute: 3.8 10*3/uL (ref 1.4–7.0)
Neutrophils: 66 %
Platelets: 292 10*3/uL (ref 150–450)
RBC: 4.5 x10E6/uL (ref 3.77–5.28)
RDW: 15.1 % (ref 11.7–15.4)
WBC: 5.8 10*3/uL (ref 3.4–10.8)

## 2022-08-05 LAB — CMP14+EGFR
ALT: 18 IU/L (ref 0–32)
AST: 19 IU/L (ref 0–40)
Albumin/Globulin Ratio: 1.7 (ref 1.2–2.2)
Albumin: 4 g/dL (ref 3.8–4.9)
Alkaline Phosphatase: 76 IU/L (ref 44–121)
BUN/Creatinine Ratio: 24 — ABNORMAL HIGH (ref 9–23)
BUN: 19 mg/dL (ref 6–24)
Bilirubin Total: 0.4 mg/dL (ref 0.0–1.2)
CO2: 24 mmol/L (ref 20–29)
Calcium: 9.6 mg/dL (ref 8.7–10.2)
Chloride: 103 mmol/L (ref 96–106)
Creatinine, Ser: 0.8 mg/dL (ref 0.57–1.00)
Globulin, Total: 2.3 g/dL (ref 1.5–4.5)
Glucose: 80 mg/dL (ref 70–99)
Potassium: 3.7 mmol/L (ref 3.5–5.2)
Sodium: 139 mmol/L (ref 134–144)
Total Protein: 6.3 g/dL (ref 6.0–8.5)
eGFR: 88 mL/min/{1.73_m2} (ref >59)

## 2022-08-05 LAB — LIPID PANEL
Chol/HDL Ratio: 2.4 ratio (ref 0.0–4.4)
Cholesterol, Total: 155 mg/dL (ref 100–199)
HDL: 65 mg/dL (ref >39)
LDL Chol Calc (NIH): 77 mg/dL (ref 0–99)
Triglycerides: 67 mg/dL (ref 0–149)
VLDL Cholesterol Cal: 13 mg/dL (ref 5–40)

## 2022-08-05 LAB — THYROID PANEL WITH TSH
Free Thyroxine Index: 1.5 (ref 1.2–4.9)
T3 Uptake Ratio: 24 % (ref 24–39)
T4, Total: 6.4 ug/dL (ref 4.5–12.0)
TSH: 1.62 u[IU]/mL (ref 0.450–4.500)

## 2022-08-05 LAB — HEMOGLOBIN A1C
Est. average glucose Bld gHb Est-mCnc: 111 mg/dL
Hgb A1c MFr Bld: 5.5 % (ref 4.8–5.6)

## 2022-08-05 LAB — VITAMIN D 25 HYDROXY (VIT D DEFICIENCY, FRACTURES): Vit D, 25-Hydroxy: 51.4 ng/mL (ref 30.0–100.0)

## 2022-09-05 ENCOUNTER — Other Ambulatory Visit: Payer: Self-pay

## 2022-09-06 ENCOUNTER — Other Ambulatory Visit: Payer: Self-pay

## 2022-09-22 DIAGNOSIS — E669 Obesity, unspecified: Secondary | ICD-10-CM | POA: Diagnosis not present

## 2022-09-22 DIAGNOSIS — I10 Essential (primary) hypertension: Secondary | ICD-10-CM | POA: Diagnosis not present

## 2022-09-22 DIAGNOSIS — Z8673 Personal history of transient ischemic attack (TIA), and cerebral infarction without residual deficits: Secondary | ICD-10-CM | POA: Diagnosis not present

## 2022-09-22 DIAGNOSIS — E785 Hyperlipidemia, unspecified: Secondary | ICD-10-CM | POA: Diagnosis not present

## 2022-09-22 DIAGNOSIS — Z7982 Long term (current) use of aspirin: Secondary | ICD-10-CM | POA: Diagnosis not present

## 2022-09-22 DIAGNOSIS — Z8249 Family history of ischemic heart disease and other diseases of the circulatory system: Secondary | ICD-10-CM | POA: Diagnosis not present

## 2022-09-22 DIAGNOSIS — Z6831 Body mass index (BMI) 31.0-31.9, adult: Secondary | ICD-10-CM | POA: Diagnosis not present

## 2022-09-27 ENCOUNTER — Ambulatory Visit: Payer: 59 | Admitting: Obstetrics and Gynecology

## 2022-09-27 VITALS — BP 113/81 | HR 94 | Wt 195.0 lb

## 2022-09-27 DIAGNOSIS — N926 Irregular menstruation, unspecified: Secondary | ICD-10-CM

## 2022-09-27 NOTE — Progress Notes (Signed)
Pt had Sonata procedure last year.  Pt states she did well afterward.  Pt states she skipped cycle in April and has been bleeding every week in May.  Pt states she has had heavy bleeding.  Bleeding has now stopped as of Sunday, some spotting since.

## 2022-09-27 NOTE — Progress Notes (Signed)
  CC: menstrual irregularity Subjective:    Patient ID: Ruth Conley, female    DOB: 06/21/1965, 57 y.o.   MRN: [id number]  HPI 57 yo G2P2 seen for menstrual irregularity.  Pt had Sonata procedure about 10 months ago.  6 fibroids were treated.  Pt stated she had done well until she skipped a menses in April.  She has had intermittent irregular bleeding throughout the whole month of May.  She has bled each week for 3-4 days.  She denies usage of blood thinners.  Discussed perimenopause with pt and noted irregular bleeding prior to menopause is not unusual.   Review of Systems     Objective:   Physical Exam Vitals:   09/27/22 0837  BP: 113/81  Pulse: 94         Assessment & Plan:   1. Menstrual irregularity Will pursue expectant management for now. Will follow up in 3 months to reassess bleeding.  If there is continued bleeding or bleeding is too uncomfortable prior to visit, will get pelvic ultrasound for further evaluation.  Pt agrees with plan.  I spent 20 minutes dedicated to the care of this patient including previsit review of records, face to face time with the patient discussing treatment options, evaluation plan and post visit testing.     Warden Fillers, MD Faculty Attending, [facility]

## 2022-11-09 ENCOUNTER — Other Ambulatory Visit: Payer: Self-pay

## 2022-11-09 ENCOUNTER — Encounter: Payer: Self-pay | Admitting: Nurse Practitioner

## 2022-11-09 ENCOUNTER — Ambulatory Visit: Payer: 59 | Attending: Nurse Practitioner | Admitting: Nurse Practitioner

## 2022-11-09 VITALS — BP 132/84 | HR 84 | Ht 67.0 in | Wt 200.2 lb

## 2022-11-09 DIAGNOSIS — Z0000 Encounter for general adult medical examination without abnormal findings: Secondary | ICD-10-CM | POA: Diagnosis not present

## 2022-11-09 DIAGNOSIS — Z23 Encounter for immunization: Secondary | ICD-10-CM

## 2022-11-09 NOTE — Progress Notes (Signed)
Assessment & Plan:  Ruth Conley was seen today for annual exam.  Diagnoses and all orders for this visit:  Encounter for annual physical exam  Need for Tdap vaccination -     Tdap vaccine greater than or equal to 55yo IM    Patient has been counseled on age-appropriate routine health concerns for screening and prevention. These are reviewed and up-to-date. Referrals have been placed accordingly. Immunizations are up-to-date or declined.    Subjective:   Chief Complaint  Patient presents with   Annual Exam   HPI Ruth Conley 57 y.o. female presents to office today for annual physical. Overall she is doing well. Being followed by Gynecology for menstrual irregularity.   Blood pressure is well controlled today.  BP Readings from Last 3 Encounters:  11/09/22 132/84  09/27/22 113/81  08/04/22 138/85     Review of Systems  Constitutional:  Negative for fever, malaise/fatigue and weight loss.  HENT: Negative.  Negative for nosebleeds.   Eyes: Negative.  Negative for blurred vision, double vision and photophobia.  Respiratory: Negative.  Negative for cough and shortness of breath.   Cardiovascular: Negative.  Negative for chest pain, palpitations and leg swelling.  Gastrointestinal: Negative.  Negative for heartburn, nausea and vomiting.  Genitourinary: Negative.   Musculoskeletal: Negative.  Negative for myalgias.  Skin: Negative.   Neurological: Negative.  Negative for dizziness, focal weakness, seizures and headaches.  Endo/Heme/Allergies: Negative.   Psychiatric/Behavioral: Negative.  Negative for suicidal ideas.     Past Medical History:  Diagnosis Date   Anemia    Carotid stenosis    a. 08/2014: dopplers showing 40-59% RICA stenosis and 1-39% LICA stenosis   Hypertension    Stroke (HCC) 04/2015   L facial droop & lipis tingling; no residual findings.   TIA (transient ischemic attack) 08/2014   R arm  / hamd went numb & weak    Past Surgical History:   Procedure Laterality Date   CESAREAN SECTION     DILATATION & CURETTAGE/HYSTEROSCOPY WITH MYOSURE N/A 11/16/2021   Procedure: DILATATION & CURETTAGE/HYSTEROSCOPY WITH MYOSURE;  Surgeon: Warden Fillers, MD;  Location: [facility] OR;  Service: Gynecology;  Laterality: N/A;   TRANSTHORACIC ECHOCARDIOGRAM  04/2015   (no change from 08/2014) Normal LV size and thickness. EF 60-65%. GR 1 DD. No wall motion abnormality. No source of embolism noted    Family History  Problem Relation Age of Onset   Hypertension Mother    CAD Father 63   Heart attack Father 43   Stroke Paternal Uncle     Social History Reviewed with no changes to be made today.   Outpatient Medications Prior to Visit  Medication Sig Dispense Refill   aspirin EC 81 MG tablet Take 1 tablet (81 mg total) by mouth daily. Swallow whole. 30 tablet 12   atorvastatin (LIPITOR) 10 MG tablet Take 1 tablet (10 mg total) by mouth daily. 90 tablet 3   Cholecalciferol (VITAMIN D3 PO) Take 1 tablet by mouth daily.     ferrous sulfate 220 (44 Fe) MG/5ML solution Take 220 mg by mouth daily.     metoprolol succinate (TOPROL-XL) 50 MG 24 hr tablet Take 1 tablet (50 mg total) by mouth daily. 90 tablet 3   Multiple Vitamins-Minerals (MULTIVITAMIN WITH MINERALS) tablet Take 1 tablet by mouth daily.     Omega-3 Fatty Acids (OMEGA 3 PO) Take 600 mg by mouth daily.     Probiotic Product (PROBIOTIC DAILY PO) Take 1 capsule by mouth daily.  vitamin B-12 (CYANOCOBALAMIN) 1000 MCG tablet Take 1,000 mcg by mouth daily.     vitamin C (ASCORBIC ACID) 500 MG tablet Take 500 mg by mouth daily.     hydrochlorothiazide (MICROZIDE) 12.5 MG capsule Take 1 capsule (12.5 mg total) by mouth daily. (Patient not taking: Reported on 11/09/2022) 90 capsule 1   No facility-administered medications prior to visit.    Allergies  Allergen Reactions   Lisinopril Cough       Objective:    BP 132/84 (BP Location: Left Arm, Patient Position: Sitting, Cuff Size: Large)    Pulse 84   Ht 5\' 7"  (1.702 m)   Wt 200 lb 3.2 oz (90.8 kg)   LMP 10/17/2022 (Approximate)   SpO2 99%   BMI 31.36 kg/m  Wt Readings from Last 3 Encounters:  11/09/22 200 lb 3.2 oz (90.8 kg)  09/27/22 195 lb (88.5 kg)  08/04/22 213 lb 12.8 oz (97 kg)    Physical Exam Constitutional:      Appearance: She is well-developed.  HENT:     Head: Normocephalic and atraumatic.     Right Ear: Hearing, tympanic membrane, ear canal and external ear normal.     Left Ear: Hearing, tympanic membrane, ear canal and external ear normal.     Nose: Nose normal.     Right Turbinates: Not enlarged.     Left Turbinates: Not enlarged.     Mouth/Throat:     Lips: Pink.     Mouth: Mucous membranes are moist.     Dentition: No dental tenderness, gingival swelling, dental abscesses or gum lesions.     Pharynx: No oropharyngeal exudate.  Eyes:     General: No scleral icterus.       Right eye: No discharge.     Extraocular Movements: Extraocular movements intact.     Conjunctiva/sclera: Conjunctivae normal.     Pupils: Pupils are equal, round, and reactive to light.  Neck:     Thyroid: No thyromegaly.     Trachea: No tracheal deviation.  Cardiovascular:     Rate and Rhythm: Normal rate and regular rhythm.     Heart sounds: Normal heart sounds. No murmur heard.    No friction rub.  Pulmonary:     Effort: Pulmonary effort is normal. No accessory muscle usage or respiratory distress.     Breath sounds: Normal breath sounds. No decreased breath sounds, wheezing, rhonchi or rales.  Abdominal:     General: Bowel sounds are normal. There is no distension.     Palpations: Abdomen is soft. There is no mass.     Tenderness: There is no abdominal tenderness. There is no right CVA tenderness, left CVA tenderness, guarding or rebound.     Hernia: No hernia is present.  Musculoskeletal:        General: No tenderness or deformity. Normal range of motion.     Cervical back: Normal range of motion and neck  supple.  Lymphadenopathy:     Cervical: No cervical adenopathy.  Skin:    General: Skin is warm and dry.     Findings: No erythema.  Neurological:     Mental Status: She is alert and oriented to person, place, and time.     Cranial Nerves: No cranial nerve deficit.     Motor: Motor function is intact.     Coordination: Coordination is intact. Coordination normal.     Gait: Gait is intact.     Deep Tendon Reflexes:     Reflex  Scores:      Patellar reflexes are 1+ on the right side and 1+ on the left side. Psychiatric:        Attention and Perception: Attention normal.        Mood and Affect: Mood normal.        Speech: Speech normal.        Behavior: Behavior normal.        Thought Content: Thought content normal.        Judgment: Judgment normal.          Patient has been counseled extensively about nutrition and exercise as well as the importance of adherence with medications and regular follow-up. The patient was given clear instructions to go to ER or return to medical center if symptoms don't improve, worsen or new problems develop. The patient verbalized understanding.   Follow-up: Return in about 5 months (around 04/11/2023) for htn.   Claiborne Rigg, FNP-BC [facility] [place], [place] [phone]   11/09/2022, 8:34 PM

## 2022-11-11 ENCOUNTER — Other Ambulatory Visit: Payer: Self-pay

## 2022-12-01 ENCOUNTER — Other Ambulatory Visit: Payer: Self-pay

## 2022-12-06 ENCOUNTER — Other Ambulatory Visit: Payer: Self-pay

## 2022-12-07 ENCOUNTER — Other Ambulatory Visit: Payer: Self-pay

## 2023-01-10 ENCOUNTER — Other Ambulatory Visit: Payer: Self-pay

## 2023-02-23 ENCOUNTER — Other Ambulatory Visit: Payer: Self-pay

## 2023-03-27 ENCOUNTER — Other Ambulatory Visit: Payer: Self-pay

## 2023-04-11 ENCOUNTER — Ambulatory Visit: Payer: 59 | Admitting: Nurse Practitioner

## 2023-04-24 ENCOUNTER — Other Ambulatory Visit: Payer: Self-pay

## 2023-06-06 ENCOUNTER — Other Ambulatory Visit: Payer: Self-pay

## 2023-06-07 ENCOUNTER — Ambulatory Visit: Payer: 59 | Admitting: Nurse Practitioner

## 2023-06-08 ENCOUNTER — Other Ambulatory Visit: Payer: Self-pay

## 2023-06-09 ENCOUNTER — Other Ambulatory Visit: Payer: Self-pay

## 2023-07-07 ENCOUNTER — Other Ambulatory Visit: Payer: Self-pay

## 2023-07-11 ENCOUNTER — Ambulatory Visit: Payer: 59 | Admitting: Nurse Practitioner

## 2023-08-02 ENCOUNTER — Ambulatory Visit: Admitting: Nurse Practitioner

## 2023-08-17 ENCOUNTER — Other Ambulatory Visit: Payer: Self-pay | Admitting: Nurse Practitioner

## 2023-08-17 ENCOUNTER — Other Ambulatory Visit: Payer: Self-pay

## 2023-08-17 DIAGNOSIS — I63531 Cerebral infarction due to unspecified occlusion or stenosis of right posterior cerebral artery: Secondary | ICD-10-CM

## 2023-08-17 DIAGNOSIS — E7800 Pure hypercholesterolemia, unspecified: Secondary | ICD-10-CM

## 2023-08-17 DIAGNOSIS — I10 Essential (primary) hypertension: Secondary | ICD-10-CM

## 2023-08-17 MED ORDER — ATORVASTATIN CALCIUM 10 MG PO TABS
10.0000 mg | ORAL_TABLET | Freq: Every day | ORAL | 0 refills | Status: DC
  Filled 2023-08-17: qty 30, 30d supply, fill #0

## 2023-08-17 MED ORDER — METOPROLOL SUCCINATE ER 50 MG PO TB24
50.0000 mg | ORAL_TABLET | Freq: Every day | ORAL | 0 refills | Status: DC
  Filled 2023-08-17: qty 30, 30d supply, fill #0

## 2023-08-21 ENCOUNTER — Other Ambulatory Visit: Payer: Self-pay

## 2023-08-29 ENCOUNTER — Encounter: Payer: Self-pay | Admitting: Nurse Practitioner

## 2023-08-29 ENCOUNTER — Ambulatory Visit: Attending: Nurse Practitioner | Admitting: Nurse Practitioner

## 2023-08-29 ENCOUNTER — Other Ambulatory Visit: Payer: Self-pay

## 2023-08-29 VITALS — BP 162/94 | HR 87 | Resp 19 | Ht 67.0 in | Wt 231.6 lb

## 2023-08-29 DIAGNOSIS — Z1231 Encounter for screening mammogram for malignant neoplasm of breast: Secondary | ICD-10-CM | POA: Diagnosis not present

## 2023-08-29 DIAGNOSIS — I10 Essential (primary) hypertension: Secondary | ICD-10-CM | POA: Diagnosis not present

## 2023-08-29 DIAGNOSIS — Z6835 Body mass index (BMI) 35.0-35.9, adult: Secondary | ICD-10-CM

## 2023-08-29 MED ORDER — METOPROLOL SUCCINATE ER 50 MG PO TB24
50.0000 mg | ORAL_TABLET | Freq: Every day | ORAL | 1 refills | Status: DC
  Filled 2023-08-29 – 2023-09-20 (×2): qty 90, 90d supply, fill #0
  Filled 2024-02-04: qty 90, 90d supply, fill #1

## 2023-08-29 MED ORDER — WEGOVY 0.25 MG/0.5ML ~~LOC~~ SOAJ
0.2500 mg | SUBCUTANEOUS | 1 refills | Status: DC
  Filled 2023-08-29 – 2023-09-20 (×2): qty 2, 28d supply, fill #0
  Filled 2023-11-29: qty 2, 28d supply, fill #1

## 2023-08-29 NOTE — Progress Notes (Signed)
 Assessment & Plan:  Ruth Conley was seen today for medical management of chronic issues.  Diagnoses and all orders for this visit:  Essential hypertension -     Semaglutide-Weight Management (WEGOVY) 0.25 MG/0.5ML SOAJ; Inject 0.25 mg into the skin once a week. -     metoprolol  succinate (TOPROL -XL) 50 MG 24 hr tablet; Take 1 tablet (50 mg total) by mouth daily.  Breast cancer screening by mammogram -     MS 3D SCR MAMMO BILAT BR (aka MM); Future  Severe obesity (BMI 35.0-35.9 with comorbidity) (HCC) -     Semaglutide-Weight Management (WEGOVY) 0.25 MG/0.5ML SOAJ; Inject 0.25 mg into the skin once a week. -     Amb ref to Medical Nutrition Therapy-MNT    Patient has been counseled on age-appropriate routine health concerns for screening and prevention. These are reviewed and up-to-date. Referrals have been placed accordingly. Immunizations are up-to-date or declined.    Subjective:   Chief Complaint  Patient presents with   Medical Management of Chronic Issues    Ruth Conley 58 y.o. female presents to office today for follow up to HTN.  HTN Blood pressure not quite at goal. Her hydrochlorothiazide  was stopped by cardiology over 1 year ago. Ruth Conley is currently taking toprol  XL 50 mg daily. We may need to add low dose CCB if continues elevated. Ruth Conley did state Ruth Conley has not taken her toprol  XL today due to her menstrual cycle. We did have a discussion that her ASA is likely causing her heavy bleeding and not the metoprolol .  BP Readings from Last 3 Encounters:  08/29/23 (!) 160/91  11/09/22 132/84  09/27/22 113/81      WEIGHT MANAGEMENT Ruth Conley is considered obese and we are requesting WEGOVY for weight management today.  The patient is new to therapy and is 58 years old with a BMI of 36.27 today in office. Ruth Conley has a co morbid history of : Primary hypertension for which we are managing with an antihypertensive.  The patient is currently on and will continue lifestyle  modification including structured nutrition and physical activity at the time GLP1 therapy commences and Ruth Conley will NOT be using the requested agent in combination with another GLP-1  Ruth Conley does NOT have any FDA-labeled contraindications to the requested agent, including history of medullary thyroid  cancer or  multiple endocrine neoplasia type II Current weight loss activities Ruth Conley has implemented include: strength training, calorie counting, cardio, cruciferous diet with reduced caloric diet, intermittent fasting, and step counting with goal 10,000 steps per day.     Review of Systems  Constitutional:  Negative for fever, malaise/fatigue and weight loss.  HENT: Negative.  Negative for nosebleeds.   Eyes: Negative.  Negative for blurred vision, double vision and photophobia.  Respiratory: Negative.  Negative for cough and shortness of breath.   Cardiovascular: Negative.  Negative for chest pain, palpitations and leg swelling.  Gastrointestinal: Negative.  Negative for heartburn, nausea and vomiting.  Musculoskeletal: Negative.  Negative for myalgias.  Neurological: Negative.  Negative for dizziness, focal weakness, seizures and headaches.  Psychiatric/Behavioral: Negative.  Negative for suicidal ideas.     Past Medical History:  Diagnosis Date   Anemia    Carotid stenosis    a. 08/2014: dopplers showing 40-59% RICA stenosis and 1-39% LICA stenosis   Hypertension    Stroke (HCC) 04/2015   L facial droop & lipis tingling; no residual findings.   TIA (transient ischemic attack) 08/2014   R arm  / hamd went  numb & weak    Past Surgical History:  Procedure Laterality Date   CESAREAN SECTION     DILATATION & CURETTAGE/HYSTEROSCOPY WITH MYOSURE N/A 11/16/2021   Procedure: DILATATION & CURETTAGE/HYSTEROSCOPY WITH MYOSURE;  Surgeon: Abigail Abler, MD;  Location: [facility] OR;  Service: Gynecology;  Laterality: N/A;   TRANSTHORACIC ECHOCARDIOGRAM  04/2015   (no change from 08/2014) Normal LV size  and thickness. EF 60-65%. GR 1 DD. No wall motion abnormality. No source of embolism noted    Family History  Problem Relation Age of Onset   Hypertension Mother    CAD Father 66   Heart attack Father 66   Stroke Paternal Uncle     Social History Reviewed with no changes to be made today.   Outpatient Medications Prior to Visit  Medication Sig Dispense Refill   aspirin  EC 81 MG tablet Take 1 tablet (81 mg total) by mouth daily. Swallow whole. 30 tablet 12   atorvastatin  (LIPITOR) 10 MG tablet Take 1 tablet (10 mg total) by mouth daily. 30 tablet 0   Cholecalciferol (VITAMIN D3 PO) Take 1 tablet by mouth daily.     ferrous sulfate  220 (44 Fe) MG/5ML solution Take 220 mg by mouth daily.     Multiple Vitamins-Minerals (MULTIVITAMIN WITH MINERALS) tablet Take 1 tablet by mouth daily.     Omega-3 Fatty Acids (OMEGA 3 PO) Take 600 mg by mouth daily.     Probiotic Product (PROBIOTIC DAILY PO) Take 1 capsule by mouth daily.     vitamin B-12 (CYANOCOBALAMIN) 1000 MCG tablet Take 1,000 mcg by mouth daily.     vitamin C (ASCORBIC ACID) 500 MG tablet Take 500 mg by mouth daily.     metoprolol  succinate (TOPROL -XL) 50 MG 24 hr tablet Take 1 tablet (50 mg total) by mouth daily. 30 tablet 0   hydrochlorothiazide  (MICROZIDE ) 12.5 MG capsule Take 1 capsule (12.5 mg total) by mouth daily. (Patient not taking: Reported on 11/09/2022) 90 capsule 1   No facility-administered medications prior to visit.    Allergies  Allergen Reactions   Lisinopril  Cough       Objective:    BP (!) 160/91 (BP Location: Left Arm, Patient Position: Sitting, Cuff Size: Normal)   Pulse 87   Resp 19   Ht 5\' 7"  (1.702 m)   Wt 231 lb 9.6 oz (105.1 kg)   LMP 08/26/2023 (Approximate)   SpO2 100%   BMI 36.27 kg/m  Wt Readings from Last 3 Encounters:  08/29/23 231 lb 9.6 oz (105.1 kg)  11/09/22 200 lb 3.2 oz (90.8 kg)  09/27/22 195 lb (88.5 kg)    Physical Exam Vitals and nursing note reviewed.  Constitutional:       Appearance: Ruth Conley is well-developed.  HENT:     Head: Normocephalic and atraumatic.  Cardiovascular:     Rate and Rhythm: Normal rate and regular rhythm.     Heart sounds: Normal heart sounds. No murmur heard.    No friction rub. No gallop.  Pulmonary:     Effort: Pulmonary effort is normal. No tachypnea or respiratory distress.     Breath sounds: Normal breath sounds. No decreased breath sounds, wheezing, rhonchi or rales.  Chest:     Chest wall: No tenderness.  Abdominal:     General: Bowel sounds are normal.     Palpations: Abdomen is soft.  Musculoskeletal:        General: Normal range of motion.     Cervical back: Normal range of  motion.  Skin:    General: Skin is warm and dry.  Neurological:     Mental Status: Ruth Conley is alert and oriented to person, place, and time.     Coordination: Coordination normal.  Psychiatric:        Behavior: Behavior normal. Behavior is cooperative.        Thought Content: Thought content normal.        Judgment: Judgment normal.          Patient has been counseled extensively about nutrition and exercise as well as the importance of adherence with medications and regular follow-up. The patient was given clear instructions to go to ER or return to medical center if symptoms don't improve, worsen or new problems develop. The patient verbalized understanding.   Follow-up: Return in about 3 months (around 11/28/2023) for physical.   Collins Dean, FNP-BC [facility] [place], [place] [phone]   08/29/2023, 11:02 PM

## 2023-08-30 ENCOUNTER — Other Ambulatory Visit: Payer: Self-pay

## 2023-09-04 ENCOUNTER — Telehealth: Payer: Self-pay

## 2023-09-04 ENCOUNTER — Other Ambulatory Visit: Payer: Self-pay

## 2023-09-04 NOTE — Telephone Encounter (Signed)
 Pharmacy Patient Advocate Encounter   Received notification from CoverMyMeds that prior authorization for WEGOVY  is required/requested.   Insurance verification completed.   The patient is insured through [facility] MEDICAID .   Per test claim: PA required; PA submitted to above mentioned insurance via CoverMyMeds Key/confirmation #/EOC [id number] Status is pending

## 2023-09-11 ENCOUNTER — Encounter (HOSPITAL_COMMUNITY): Payer: Self-pay

## 2023-09-14 ENCOUNTER — Encounter: Attending: Nurse Practitioner | Admitting: Dietician

## 2023-09-14 ENCOUNTER — Encounter: Payer: Self-pay | Admitting: Dietician

## 2023-09-14 NOTE — Progress Notes (Unsigned)
 Medical Nutrition Therapy  Appointment Start time:  1650  Appointment End time:  1759  Primary concerns today: increased weight and blood pressure  Referral diagnosis: E66.01 (obesity) Preferred learning style: no preference indicated (auditory, visual, hands on, no preference indicated) Learning readiness: ready (not ready, contemplating, ready, change in progress)  NUTRITION ASSESSMENT  Anthropometrics Start Weight at [facility]: 229.0 lbs Height: 67 in   Body Composition Scale 09/14/2023  Current Body Weight 229.0  Total Body Fat % 42.3  Visceral Fat 14  Fat-Free Mass % 57.6   Total Body Water  % 43.3  Muscle-Mass lbs 32.5  BMI 35.6  Body Fat Displacement          Torso  lbs 60.0         Left Leg  lbs 12.0         Right Leg  lbs 12.0         Left Arm  lbs 6.0         Right Arm  lbs 6.0   Clinical Medical Hx: HTN Medications: see list Labs: WNL Notable Signs/Symptoms: none noted  Lifestyle & Dietary Hx  Pt states she is trying to balance weight and menapause. Pt states she has a prescription for Wegovy , stating she does not want to take it, stating she her some bad things about them. Pt states she feels stable right now. Pt state she will get more labs in July, stating last year her labs were good, stating better than they have been in the past. Pt states she feels her digestive track is sluggish, stating she may have some constipation.  Estimated daily fluid intake: 70 oz Supplements: probiotic, Vit D, Vit C, Omega 3, B-12, JJ supplement for  Sleep: great 8-9 hours per day Stress / self-care: stress in the past, but not as much now 1 on a scale of 1-10; reads, pray, do other activity, get rid of or resolve it. Current average weekly physical activity: walking every morning for 20 minutes and a HIIT exercises for 10 minutes.  24-Hr Dietary Recall First Meal: skip (intermittent fasting) Snack: 11:00 am smoothie (orgain protein shake, spinach, peanut butter, banana, chia,  flax) Second Meal (12-2): baked chicken, Garald Rhew rice or baked sweet potato vegetables (broccoli or brussel sprouts) Snack:  Third Meal: 5-5:30 protein and vegetables Snack: hot herbal tea Beverages: herbal tea, water  (with ginger, cucumber and mint)  NUTRITION DIAGNOSIS  NB-1.1 Food and nutrition-related knowledge deficit As related to weight gain.  As evidenced by first visit of this type.  NUTRITION INTERVENTION  Nutrition education (E-1) on the following topics:  The importance of physical activity cannot be overstated, as it plays a vital role in both physical and mental well-being for everyone. Weight Management: Physical activity helps burn calories, which is crucial for achieving and maintaining a healthy weight. It also helps build muscle mass, which is more metabolically active than fat. Reduced Risk of Chronic Diseases: Regular exercise significantly lowers the risk of developing serious conditions such as: Heart disease and stroke Type 2 diabetes Some types of cancer (colon, breast, endometrial, and others) High blood pressure High cholesterol Metabolic syndrome Improved Mental Health: Physical activity has been shown to: Reduce feelings of anxiety and depression Improve mood Boost self-esteem Enhance cognitive function Improve sleep quality Reduce stress Stronger Bones and Muscles: Weight-bearing exercises help build and maintain strong bones and muscles, reducing the risk of osteoporosis and falls, especially as we age. Increased Energy Levels: Regular physical activity can improve energy levels  and reduce feelings of fatigue. Longer Life: Studies show that physically active individuals tend to live longer than those who are inactive. Why you need complex carbohydrates: Whole grains and other complex carbohydrates are required to have a healthy diet. Whole grains provide fiber which can help with blood glucose levels and help keep you satiated. Fruits and starchy vegetables  provide essential vitamins and minerals required for immune function, eyesight support, brain support, bone density, wound healing and many other functions within the body. According to the current evidenced based 2020-2025 Dietary Guidelines for Americans, complex carbohydrates are part of a healthy eating pattern which is associated with a decreased risk for type 2 diabetes, cancers, and cardiovascular disease.  The two main types of dietary fats: saturated and unsaturated. Understanding the difference is important for making healthy food choices.   Saturated Fats: Main Sources: Primarily found in animal products and some tropical oils: Fatty meats (beef, lamb, pork) Processed meats (sausages, bacon) High-fat dairy products (butter, cream, whole milk, cheese, ice cream) Lard and ghee Coconut oil, palm oil, and palm kernel oil Some baked goods and fried foods Impact on Health: Generally, saturated fats can increase LDL ("bad") cholesterol levels in the blood. High LDL cholesterol is a risk factor for heart disease. However, the impact of saturated fats on heart health is a complex and ongoing area of research, and some studies suggest that the source of saturated fat might matter more than the total amount. Current recommendations generally advise limiting saturated fat intake.   Unsaturated Fats: State at Room Temperature: They are typically liquid at room temperature. Think of olive oil and vegetable oils.    Main Types: Unsaturated fats are further divided into two main categories:    Monounsaturated Fats: These have one double bond.    Sources: Olive oil, avocados, nuts (almonds, peanuts, cashews, pecans), seeds (pumpkin, sesame), peanut oil, canola oil.    Impact on Health: Monounsaturated fats can help lower LDL cholesterol and may also increase HDL ("good") cholesterol. They are considered heart-healthy fats.    Polyunsaturated Fats: These have two or more double bonds. They include omega-3  and omega-6 fatty acids, which are essential fatty acids (meaning your body can't produce them).    Omega-3 Fatty Acids: Sources: Fatty fish (salmon, mackerel, herring, tuna, sardines), flaxseeds, chia seeds, walnuts, soybean oil, canola oil.    Impact on Health: Omega-3s have numerous benefits, including reducing triglycerides, lowering blood pressure, reducing blood clotting, and decreasing the risk of heart attack and stroke. They also have anti-inflammatory properties.    Omega-6 Fatty Acids: Sources: Vegetable oils (corn oil, sunflower oil, soybean oil), walnuts, pumpkin seeds, sesame seeds.    Impact on Health: Omega-6s are also essential, but it's important to have a balanced ratio of omega-6 to omega-3 fats. Most Western diets tend to be higher in omega-6s. They can also help lower LDL cholesterol. Fruits & Vegetables: Aim to fill half your plate with a variety of fruits and vegetables. They are rich in vitamins, minerals, and fiber, and can help reduce the risk of chronic diseases. Choose a colorful assortment of fruits and vegetables to ensure you get a wide range of nutrients. Grains and Starches: Make at least half of your grain choices whole grains, such as Thai Hemrick rice, whole wheat bread, and oats. Whole grains provide fiber, which aids in digestion and healthy cholesterol levels. Aim for whole forms of starchy vegetables such as potatoes, sweet potatoes, beans, peas, and corn, which are fiber rich and provide  many vitamins and minerals.  Protein: Incorporate lean sources of protein, such as poultry, fish, beans, nuts, and seeds, into your meals. Protein is essential for building and repairing tissues, staying full, balancing blood sugar, as well as supporting immune function. Dairy: Include low-fat or fat-free dairy products like milk, yogurt, and cheese in your diet. Dairy foods are excellent sources of calcium  and vitamin D , which are crucial for bone health.  Physical Activity: Aim for 150  minutes of physical activity weekly. Regular physical activity promotes overall health-including helping to reduce risk for heart disease and diabetes, promoting mental health, and helping us  sleep better.   Handouts Provided Include  Meal Ideas MyPlate Types of Fat (unsaturated vs saturated) Health Benefits of Physical Activity  Learning Style & Readiness for Change Teaching method utilized: Visual & Auditory  Demonstrated degree of understanding via: Teach Back  Barriers to learning/adherence to lifestyle change: nothing identified  Goals Established by Pt Increase physical activity; spend more time with resistance training Eat breakfast (already having not eaten for more 12 hours while sleeping and avoiding food before bed time).  MONITORING & EVALUATION Dietary intake, weekly physical activity.  Next Steps  Patient is to return in 2-3 months for follow-up.

## 2023-09-18 ENCOUNTER — Ambulatory Visit

## 2023-09-19 ENCOUNTER — Ambulatory Visit
Admission: RE | Admit: 2023-09-19 | Discharge: 2023-09-19 | Disposition: A | Discharge status: Home / Self Care | Source: Ambulatory Visit | Attending: Nurse Practitioner | Admitting: Nurse Practitioner

## 2023-09-19 DIAGNOSIS — Z1231 Encounter for screening mammogram for malignant neoplasm of breast: Secondary | ICD-10-CM

## 2023-09-20 ENCOUNTER — Telehealth: Payer: Self-pay

## 2023-09-20 ENCOUNTER — Other Ambulatory Visit: Payer: Self-pay

## 2023-09-20 ENCOUNTER — Other Ambulatory Visit: Payer: Self-pay | Admitting: Nurse Practitioner

## 2023-09-20 DIAGNOSIS — E7800 Pure hypercholesterolemia, unspecified: Secondary | ICD-10-CM

## 2023-09-20 DIAGNOSIS — I63531 Cerebral infarction due to unspecified occlusion or stenosis of right posterior cerebral artery: Secondary | ICD-10-CM

## 2023-09-20 MED ORDER — ATORVASTATIN CALCIUM 10 MG PO TABS
10.0000 mg | ORAL_TABLET | Freq: Every day | ORAL | 1 refills | Status: AC
  Filled 2023-09-20: qty 90, 90d supply, fill #0

## 2023-09-20 NOTE — Telephone Encounter (Signed)
 Pharmacy Patient Advocate Encounter   Received notification from CoverMyMeds that prior authorization for WEGOVY  is required/requested.   Insurance verification completed.   The patient is insured through [facility] MEDICAID .   Per test claim: PA required; PA submitted to above mentioned insurance via Fax Key/confirmation #/EOC  [id number] Status is pending

## 2023-09-22 ENCOUNTER — Other Ambulatory Visit: Payer: Self-pay

## 2023-09-23 ENCOUNTER — Ambulatory Visit: Payer: Self-pay | Admitting: Nurse Practitioner

## 2023-11-16 ENCOUNTER — Encounter: Attending: Nurse Practitioner | Admitting: Dietician

## 2023-11-16 ENCOUNTER — Encounter: Payer: Self-pay | Admitting: Dietician

## 2023-11-16 VITALS — Ht 67.0 in | Wt 205.5 lb

## 2023-11-16 DIAGNOSIS — E669 Obesity, unspecified: Secondary | ICD-10-CM | POA: Insufficient documentation

## 2023-11-16 NOTE — Progress Notes (Signed)
 Medical Nutrition Therapy Follow-up  Appointment Start time:  1709  Appointment End time:  1745  Primary concerns today: increased weight and blood pressure  Referral diagnosis: E66.01 (obesity) Preferred learning style: no preference indicated (auditory, visual, hands on, no preference indicated) Learning readiness: ready (not ready, contemplating, ready, change in progress)  NUTRITION ASSESSMENT  Anthropometrics Start Weight at [facility]: 229.0 lbs Height: 67 in Weight today:  205.5 lbs  Body Composition Scale 09/14/2023 11/16/2023  Current Body Weight 229.0 205.5  Total Body Fat % 42.3 39.2  Visceral Fat 14 11  Fat-Free Mass % 57.6 60.7   Total Body Water  % 43.3 44.8  Muscle-Mass lbs 32.5 32.2  BMI 35.6 31.9  Body Fat Displacement           Torso  lbs 60.0 49.9         Left Leg  lbs 12.0 9.9         Right Leg  lbs 12.0 9.9         Left Arm  lbs 6.0 4.9         Right Arm  lbs 6.0 4.9   Clinical Medical Hx: HTN Medications: see list Labs: WNL Notable Signs/Symptoms: none noted  Lifestyle & Dietary Hx  Pt states she had done a 9 day detox, with lots of fruits and vegetables smoothies, stating after the 9 days, she started introducing meats. Pt states she did not have cravings during the detox, stating even during that time of the month. Pt states she does not crave coffee now, stating she has a more natural boost. Pt states she does not do intermittent fasting, and still doing water  flush and celery juice. Stating she eats breakfast by 8:30, and 11:30 lunch. Pt states her fridge looks different now. Pt is doing great.  Estimated daily fluid intake: 64-70 oz Supplements: probiotic, Vit D, Vit C, Omega 3, B-12, JJ supplement for  Sleep: great 8-9 hours per day Stress / self-care: stress in the past, but not as much now 1 on a scale of 1-10; reads, pray, do other activity, get rid of or resolve it. Current average weekly physical activity: walking every morning for 20 minutes  and a HIIT exercises for 10 minutes. Light weights 5 days per week and walking with them.  24-Hr Dietary Recall First Meal: smoothie or scrambled eggs or oatmeal Snack: 11:00 am smoothie (orgain protein shake, spinach, peanut butter, banana, chia, flax) Second Meal (12-2): baked chicken, Basil Blakesley rice or baked sweet potato vegetables (broccoli or brussel sprouts) Snack:  Third Meal: 5-5:30 protein and vegetables Snack: hot herbal tea Beverages: herbal tea, water  (with ginger, cucumber and mint)  NUTRITION DIAGNOSIS  NB-1.1 Food and nutrition-related knowledge deficit As related to weight gain.  As evidenced by first visit of this type.  NUTRITION INTERVENTION  Nutrition education (E-1) on the following topics:  Healthy eating patterns and consistent physical activity are the cornerstones of achieving and maintaining a healthy weight. A balanced diet, rich in whole foods and mindful of portion sizes, provides the necessary nutrients while controlling caloric intake, directly impacting weight. Complementing this, regular physical activity burns calories, builds muscle mass (which boosts metabolism), and improves the body's sensitivity to insulin. Together, these two pillars create a sustainable energy balance where calories consumed are aligned with calories expended, preventing excess fat storage and fostering a healthy body composition. This synergistic approach is far more effective and sustainable for long-term weight management than either component alone. Progressively increasing the intensity of  physical activity elevates calorie expenditure, boosts metabolism, and builds lean muscle mass, which further enhances calorie burning even at rest.   Handouts Provided Include  Body Comp Scale readout  Learning Style & Readiness for Change Teaching method utilized: Visual & Auditory  Demonstrated degree of understanding via: Teach Back  Barriers to learning/adherence to lifestyle change: nothing  identified  Goals Established by Pt New/Re-engage: Increase physical activity; increase intensity Continue: Eat breakfast (already having not eaten for more 12 hours while sleeping and avoiding food before bed time).  MONITORING & EVALUATION Dietary intake, weekly physical activity.  Next Steps  Patient is to return in 2-3 months for follow-up.

## 2023-11-28 ENCOUNTER — Encounter: Admitting: Nurse Practitioner

## 2023-11-28 ENCOUNTER — Telehealth: Payer: Self-pay | Admitting: Nurse Practitioner

## 2023-11-28 NOTE — Telephone Encounter (Signed)
 Called patient to confirm upcoming appointment 11/29/2023. Patient appointment has been successfully confirmed

## 2023-11-29 ENCOUNTER — Ambulatory Visit: Attending: Nurse Practitioner | Admitting: Nurse Practitioner

## 2023-11-29 ENCOUNTER — Other Ambulatory Visit: Payer: Self-pay

## 2023-11-29 ENCOUNTER — Encounter: Payer: Self-pay | Admitting: Nurse Practitioner

## 2023-11-29 VITALS — BP 132/82 | HR 97 | Ht 67.0 in | Wt 205.6 lb

## 2023-11-29 DIAGNOSIS — Z0000 Encounter for general adult medical examination without abnormal findings: Secondary | ICD-10-CM | POA: Diagnosis not present

## 2023-11-29 NOTE — Progress Notes (Signed)
 Assessment & Plan:  Ruth Conley was seen today for annual exam.  Diagnoses and all orders for this visit:  Encounter for annual physical exam -     Lipid panel; Future -     Hemoglobin A1c; Future -     CMP14+EGFR; Future -     CBC with Differential; Future    Patient has been counseled on age-appropriate routine health concerns for screening and prevention. These are reviewed and up-to-date. Referrals have been placed accordingly. Immunizations are up-to-date or declined.    Subjective:   Chief Complaint  Patient presents with   Annual Exam    Patient want to talk about wegovy - hasn't started it but losing weight naturally     Ruth Conley 58 y.o. female presents to office today for annual physical  She has a past medical history of Carotid stenosis, Hypertension, Stroke, and TIA  (2016 no residual deficits).    Weight is trending down nicely. She has been juicing and detoxing and feels great. She did not start the wegovy  due to concern about potential side effects.   Blood pressure is elevated here in office however she reports normal readings at home. 110-120/80s.  Review of Systems  Constitutional:  Negative for fever, malaise/fatigue and weight loss.  HENT: Negative.  Negative for nosebleeds.   Eyes: Negative.  Negative for blurred vision, double vision and photophobia.  Respiratory: Negative.  Negative for cough and shortness of breath.   Cardiovascular: Negative.  Negative for chest pain, palpitations and leg swelling.  Gastrointestinal: Negative.  Negative for heartburn, nausea and vomiting.  Genitourinary: Negative.   Musculoskeletal: Negative.  Negative for myalgias.  Skin: Negative.   Neurological: Negative.  Negative for dizziness, focal weakness, seizures and headaches.  Endo/Heme/Allergies: Negative.   Psychiatric/Behavioral: Negative.  Negative for suicidal ideas.     Past Medical History:  Diagnosis Date   Anemia    Carotid stenosis    a.  08/2014: dopplers showing 40-59% RICA stenosis and 1-39% LICA stenosis   Hypertension    Stroke (HCC) 04/2015   L facial droop & lipis tingling; no residual findings.   TIA (transient ischemic attack) 08/2014   R arm  / hamd went numb & weak    Past Surgical History:  Procedure Laterality Date   CESAREAN SECTION     DILATATION & CURETTAGE/HYSTEROSCOPY WITH MYOSURE N/A 11/16/2021   Procedure: DILATATION & CURETTAGE/HYSTEROSCOPY WITH MYOSURE;  Surgeon: Zina Jerilynn LABOR, MD;  Location: [facility] OR;  Service: Gynecology;  Laterality: N/A;   TRANSTHORACIC ECHOCARDIOGRAM  04/2015   (no change from 08/2014) Normal LV size and thickness. EF 60-65%. GR 1 DD. No wall motion abnormality. No source of embolism noted    Family History  Problem Relation Age of Onset   Hypertension Mother    CAD Father 67   Heart attack Father 61   Stroke Paternal Uncle     Social History Reviewed with no changes to be made today.   Outpatient Medications Prior to Visit  Medication Sig Dispense Refill   aspirin  EC 81 MG tablet Take 1 tablet (81 mg total) by mouth daily. Swallow whole. 30 tablet 12   atorvastatin  (LIPITOR) 10 MG tablet Take 1 tablet (10 mg total) by mouth daily. 90 tablet 1   Cholecalciferol (VITAMIN D3 PO) Take 1 tablet by mouth daily.     ferrous sulfate  220 (44 Fe) MG/5ML solution Take 220 mg by mouth daily.     metoprolol  succinate (TOPROL -XL) 50 MG 24 hr tablet  Take 1 tablet (50 mg total) by mouth daily. 90 tablet 1   Multiple Vitamins-Minerals (MULTIVITAMIN WITH MINERALS) tablet Take 1 tablet by mouth daily.     Omega-3 Fatty Acids (OMEGA 3 PO) Take 600 mg by mouth daily.     Probiotic Product (PROBIOTIC DAILY PO) Take 1 capsule by mouth daily.     vitamin B-12 (CYANOCOBALAMIN) 1000 MCG tablet Take 1,000 mcg by mouth daily.     vitamin C (ASCORBIC ACID) 500 MG tablet Take 500 mg by mouth daily.     Semaglutide -Weight Management (WEGOVY ) 0.25 MG/0.5ML SOAJ Inject 0.25 mg into the skin once a  week. (Patient not taking: Reported on 11/29/2023) 2 mL 1   No facility-administered medications prior to visit.    Allergies  Allergen Reactions   Lisinopril  Cough       Objective:    BP (!) 135/96 (BP Location: Right Arm, Patient Position: Sitting, Cuff Size: Normal)   Pulse 97   Ht 5' 7 (1.702 m)   Wt 205 lb 9.6 oz (93.3 kg)   SpO2 100%   BMI 32.20 kg/m  Wt Readings from Last 3 Encounters:  11/29/23 205 lb 9.6 oz (93.3 kg)  11/16/23 205 lb 8 oz (93.2 kg)  09/14/23 229 lb (103.9 kg)    Physical Exam Constitutional:      Appearance: She is well-developed.  HENT:     Head: Normocephalic and atraumatic.     Right Ear: Hearing, tympanic membrane, ear canal and external ear normal.     Left Ear: Hearing, tympanic membrane, ear canal and external ear normal.     Nose: Nose normal.     Right Turbinates: Not enlarged.     Left Turbinates: Not enlarged.     Mouth/Throat:     Lips: Pink.     Mouth: Mucous membranes are moist.     Dentition: No dental tenderness, gingival swelling, dental abscesses or gum lesions.     Pharynx: No oropharyngeal exudate.  Eyes:     General: No scleral icterus.       Right eye: No discharge.     Extraocular Movements: Extraocular movements intact.     Conjunctiva/sclera: Conjunctivae normal.     Pupils: Pupils are equal, round, and reactive to light.  Neck:     Thyroid : No thyromegaly.     Trachea: No tracheal deviation.  Cardiovascular:     Rate and Rhythm: Normal rate and regular rhythm.     Heart sounds: Normal heart sounds. No murmur heard.    No friction rub.  Pulmonary:     Effort: Pulmonary effort is normal. No accessory muscle usage or respiratory distress.     Breath sounds: Normal breath sounds. No decreased breath sounds, wheezing, rhonchi or rales.  Abdominal:     General: Bowel sounds are normal. There is no distension.     Palpations: Abdomen is soft. There is no mass.     Tenderness: There is no abdominal tenderness.  There is no right CVA tenderness, left CVA tenderness, guarding or rebound.     Hernia: No hernia is present.  Musculoskeletal:        General: No tenderness or deformity. Normal range of motion.     Cervical back: Normal range of motion and neck supple.  Lymphadenopathy:     Cervical: No cervical adenopathy.  Skin:    General: Skin is warm and dry.     Findings: No erythema.  Neurological:     Mental Status: She  is alert and oriented to person, place, and time.     Cranial Nerves: No cranial nerve deficit.     Motor: Motor function is intact.     Coordination: Coordination is intact. Coordination normal.     Gait: Gait is intact.     Deep Tendon Reflexes:     Reflex Scores:      Patellar reflexes are 1+ on the right side and 1+ on the left side. Psychiatric:        Attention and Perception: Attention normal.        Mood and Affect: Mood normal.        Speech: Speech normal.        Behavior: Behavior normal.        Thought Content: Thought content normal.        Judgment: Judgment normal.          Patient has been counseled extensively about nutrition and exercise as well as the importance of adherence with medications and regular follow-up. The patient was given clear instructions to go to ER or return to medical center if symptoms don't improve, worsen or new problems develop. The patient verbalized understanding.   Follow-up: Return in about 6 months (around 05/31/2024) for BP recheck.   Haze LELON Servant, FNP-BC [facility] [place], [place] [phone]   11/29/2023, 1:55 PM

## 2023-11-30 LAB — CMP14+EGFR
ALT: 20 IU/L (ref 0–32)
AST: 27 IU/L (ref 0–40)
Albumin: 3.9 g/dL (ref 3.8–4.9)
Alkaline Phosphatase: 79 IU/L (ref 44–121)
BUN/Creatinine Ratio: 6 — ABNORMAL LOW (ref 9–23)
BUN: 5 mg/dL — ABNORMAL LOW (ref 6–24)
Bilirubin Total: 0.3 mg/dL (ref 0.0–1.2)
CO2: 21 mmol/L (ref 20–29)
Calcium: 9.5 mg/dL (ref 8.7–10.2)
Chloride: 102 mmol/L (ref 96–106)
Creatinine, Ser: 0.81 mg/dL (ref 0.57–1.00)
Globulin, Total: 2.5 g/dL (ref 1.5–4.5)
Glucose: 94 mg/dL (ref 70–99)
Potassium: 4.2 mmol/L (ref 3.5–5.2)
Sodium: 139 mmol/L (ref 134–144)
Total Protein: 6.4 g/dL (ref 6.0–8.5)
eGFR: 85 mL/min/1.73 (ref >59)

## 2023-11-30 LAB — CBC WITH DIFFERENTIAL/PLATELET
Basophils Absolute: 0 x10E3/uL (ref 0.0–0.2)
Basos: 0 %
EOS (ABSOLUTE): 0.1 x10E3/uL (ref 0.0–0.4)
Eos: 2 %
Hematocrit: 37.5 % (ref 34.0–46.6)
Hemoglobin: 11.6 g/dL (ref 11.1–15.9)
Immature Grans (Abs): 0 x10E3/uL (ref 0.0–0.1)
Immature Granulocytes: 0 %
Lymphocytes Absolute: 1.6 x10E3/uL (ref 0.7–3.1)
Lymphs: 35 %
MCH: 27 pg (ref 26.6–33.0)
MCHC: 30.9 g/dL — ABNORMAL LOW (ref 31.5–35.7)
MCV: 87 fL (ref 79–97)
Monocytes Absolute: 0.6 x10E3/uL (ref 0.1–0.9)
Monocytes: 13 %
Neutrophils Absolute: 2.2 x10E3/uL (ref 1.4–7.0)
Neutrophils: 50 %
Platelets: 278 x10E3/uL (ref 150–450)
RBC: 4.29 x10E6/uL (ref 3.77–5.28)
RDW: 14 % (ref 11.7–15.4)
WBC: 4.5 x10E3/uL (ref 3.4–10.8)

## 2023-11-30 LAB — LIPID PANEL
Chol/HDL Ratio: 3.4 ratio (ref 0.0–4.4)
Cholesterol, Total: 165 mg/dL (ref 100–199)
HDL: 48 mg/dL (ref >39)
LDL Chol Calc (NIH): 101 mg/dL — ABNORMAL HIGH (ref 0–99)
Triglycerides: 85 mg/dL (ref 0–149)
VLDL Cholesterol Cal: 16 mg/dL (ref 5–40)

## 2023-11-30 LAB — HEMOGLOBIN A1C
Est. average glucose Bld gHb Est-mCnc: 108 mg/dL
Hgb A1c MFr Bld: 5.4 % (ref 4.8–5.6)

## 2023-12-08 ENCOUNTER — Other Ambulatory Visit: Payer: Self-pay

## 2023-12-09 ENCOUNTER — Ambulatory Visit: Payer: Self-pay | Admitting: Nurse Practitioner

## 2024-01-10 ENCOUNTER — Other Ambulatory Visit: Payer: Self-pay | Admitting: Nurse Practitioner

## 2024-01-10 ENCOUNTER — Other Ambulatory Visit: Payer: Self-pay

## 2024-01-10 DIAGNOSIS — I10 Essential (primary) hypertension: Secondary | ICD-10-CM

## 2024-01-10 MED ORDER — WEGOVY 0.25 MG/0.5ML ~~LOC~~ SOAJ
0.5000 mg | SUBCUTANEOUS | 1 refills | Status: DC
  Filled 2024-01-10: qty 2, 28d supply, fill #0

## 2024-01-11 ENCOUNTER — Other Ambulatory Visit: Payer: Self-pay

## 2024-01-12 ENCOUNTER — Other Ambulatory Visit: Payer: Self-pay | Admitting: Nurse Practitioner

## 2024-01-12 ENCOUNTER — Other Ambulatory Visit: Payer: Self-pay

## 2024-01-12 DIAGNOSIS — E7800 Pure hypercholesterolemia, unspecified: Secondary | ICD-10-CM

## 2024-01-12 DIAGNOSIS — I10 Essential (primary) hypertension: Secondary | ICD-10-CM

## 2024-01-12 MED ORDER — WEGOVY 0.5 MG/0.5ML ~~LOC~~ SOAJ
0.5000 mg | SUBCUTANEOUS | 1 refills | Status: AC
  Filled 2024-01-12: qty 2, 28d supply, fill #0

## 2024-01-23 ENCOUNTER — Other Ambulatory Visit: Payer: Self-pay

## 2024-02-08 ENCOUNTER — Other Ambulatory Visit: Payer: Self-pay

## 2024-03-22 DIAGNOSIS — H5213 Myopia, bilateral: Secondary | ICD-10-CM | POA: Diagnosis not present

## 2024-04-09 ENCOUNTER — Ambulatory Visit: Admitting: Obstetrics

## 2024-04-23 ENCOUNTER — Ambulatory Visit: Admitting: Obstetrics

## 2024-05-01 ENCOUNTER — Other Ambulatory Visit: Payer: Self-pay | Admitting: Nurse Practitioner

## 2024-05-01 DIAGNOSIS — I10 Essential (primary) hypertension: Secondary | ICD-10-CM

## 2024-05-02 MED ORDER — METOPROLOL SUCCINATE ER 50 MG PO TB24
50.0000 mg | ORAL_TABLET | Freq: Every day | ORAL | 0 refills | Status: AC
  Filled 2024-05-02: qty 30, 30d supply, fill #0

## 2024-05-03 ENCOUNTER — Other Ambulatory Visit: Payer: Self-pay

## 2024-05-06 ENCOUNTER — Other Ambulatory Visit: Payer: Self-pay
